# Patient Record
Sex: Female | Born: 1965 | Hispanic: Yes | Marital: Single
Health system: Midwestern US, Community
[De-identification: ages and names within clinical notes are randomized; demographics above are authoritative.]

## PROBLEM LIST (undated history)

## (undated) DIAGNOSIS — I10 Essential (primary) hypertension: Secondary | ICD-10-CM

## (undated) DIAGNOSIS — E039 Hypothyroidism, unspecified: Secondary | ICD-10-CM

## (undated) DIAGNOSIS — G4452 New daily persistent headache (NDPH): Secondary | ICD-10-CM

## (undated) DIAGNOSIS — Z01812 Encounter for preprocedural laboratory examination: Secondary | ICD-10-CM

## (undated) DIAGNOSIS — Z889 Allergy status to unspecified drugs, medicaments and biological substances status: Secondary | ICD-10-CM

## (undated) DIAGNOSIS — J309 Allergic rhinitis, unspecified: Secondary | ICD-10-CM

## (undated) DIAGNOSIS — K219 Gastro-esophageal reflux disease without esophagitis: Secondary | ICD-10-CM

## (undated) DIAGNOSIS — Z1231 Encounter for screening mammogram for malignant neoplasm of breast: Secondary | ICD-10-CM

## (undated) DIAGNOSIS — K2980 Duodenitis without bleeding: Secondary | ICD-10-CM

## (undated) DIAGNOSIS — E559 Vitamin D deficiency, unspecified: Principal | ICD-10-CM

## (undated) DIAGNOSIS — J3089 Other allergic rhinitis: Principal | ICD-10-CM

## (undated) DIAGNOSIS — R921 Mammographic calcification found on diagnostic imaging of breast: Secondary | ICD-10-CM

## (undated) HISTORY — PX: CARDIAC CATHETERIZATION: SHX172

## (undated) HISTORY — PX: BREAST BIOPSY: SHX20

## (undated) MED ORDER — LEVOTHYROXINE 112 MCG TAB
112 mcg | ORAL_TABLET | ORAL | Status: DC
Start: ? — End: 2016-09-23

---

## 2000-12-26 ENCOUNTER — Ambulatory Visit (HOSPITAL_COMMUNITY): Admission: RE | Admit: 2000-12-26 | Discharge: 2000-12-26 | Payer: Self-pay | Admitting: Obstetrics and Gynecology

## 2004-03-29 ENCOUNTER — Emergency Department (HOSPITAL_COMMUNITY): Admission: EM | Admit: 2004-03-29 | Discharge: 2004-03-29 | Payer: Self-pay | Admitting: Emergency Medicine

## 2004-04-30 ENCOUNTER — Ambulatory Visit (HOSPITAL_COMMUNITY): Admission: RE | Admit: 2004-04-30 | Discharge: 2004-04-30 | Payer: Self-pay | Admitting: Obstetrics and Gynecology

## 2004-06-17 ENCOUNTER — Inpatient Hospital Stay (HOSPITAL_COMMUNITY): Admission: RE | Admit: 2004-06-17 | Discharge: 2004-06-18 | Payer: Self-pay | Admitting: Obstetrics and Gynecology

## 2006-10-20 ENCOUNTER — Ambulatory Visit (HOSPITAL_COMMUNITY): Admission: RE | Admit: 2006-10-20 | Discharge: 2006-10-20 | Payer: Self-pay | Admitting: Obstetrics and Gynecology

## 2009-11-03 ENCOUNTER — Ambulatory Visit (HOSPITAL_COMMUNITY): Admission: RE | Admit: 2009-11-03 | Discharge: 2009-11-03 | Payer: Self-pay | Admitting: Obstetrics and Gynecology

## 2010-05-28 NOTE — Consult Note (Signed)
NAME:  Diana Small, Diana Small             ACCOUNT NO.:  [id number]   MEDICAL RECORD NO.:  [id number]          PATIENT TYPE:  EMS   LOCATION:  ED                            FACILITY:  [facility]   PHYSICIAN:  Tilda Burrow, M.D. DATE OF BIRTH:  03-16-1965   DATE OF CONSULTATION:  03/29/2004  DATE OF DISCHARGE:                                   CONSULTATION   Seen in ER consultation regarding left lower quadrant pain in this 45-year-  old Hispanic female.   CHIEF COMPLAINT:  One week of left lower quadrant pain in the upper chest  and upper back that comes and goes, still present intermittent.  Describes a  6 or 7 out of 10.  Not made worse by any medications.  It appears she has  had minimal spotting in the pregnancy.  Evaluation here confirms that she is  indeed pregnant with ultrasound showing a twin gestational sac in the  uterine fundus, 5 weeks 6 days by sac size.  There is no fetal pole  identified due to the early gestation.   The plan is to have her follow up in our office, one week.  She will also  because of the discomfort be given Tylenol No. 3 for pain 30 tablets, one to  two q.4h., plus Phenergan 25 mg tablets one q.6h. p.r.n. nausea.  Follow up  in our office in one week.      JVF/MEDQ  D:  03/29/2004  T:  03/29/2004  Job:  [id number]

## 2010-05-28 NOTE — Op Note (Signed)
NAMETANGY, DROZDOWSKI             ACCOUNT NO.:  [id number]   MEDICAL RECORD NO.:  [id number]          PATIENT TYPE:  AMB   LOCATION:  DAY                           FACILITY:  [facility]   PHYSICIAN:  Tilda Burrow, M.D. DATE OF BIRTH:  1965-07-04   DATE OF PROCEDURE:  06/17/2004  DATE OF DISCHARGE:                                 OPERATIVE REPORT   PREOPERATIVE DIAGNOSIS:  Pregnancy, 17 weeks' gestation, fetal demise in  utero.   POSTOPERATIVE DIAGNOSIS:  Pregnancy, 17 weeks' gestation, fetal demise in  utero, uterine size greater than dates.   PROCEDURE:  Exam under anesthesia, uterine sounding.   SURGEON:  Christin Bach, M.D.   ASSISTANT:  None.   ANESTHESIA:  General with LMA.   COMPLICATIONS:  Uterine cavity size deemed to great to proceed with D&C and  conversion to prostaglandin term evacuation of uterus felt prudent and  safer.   INDICATIONS FOR PROCEDURE:  This 45 year old female admitted for suction  D&C.  She has a 14 week size fetus based on ultrasound done in our office  yesterday.  She was 17 weeks by gestation.  She has a small gestational sac  around the baby (oligohydramnios), fetal pole was compressed and as per  discussions in the office yesterday, plans were for suction D&C.  She had a  cervical laminaria placed yesterday to dilate the cervix and is scheduled  for suction D&C.   DETAILS OF PROCEDURE:  The patient was taken to the operating room after  routine preoperative assessments.  In the preoperative area, the laminaria  had been removed, bimanual exam performed confirmed what was felt to be a 12-  14 week sized uterus.  The patient was then taken to the operating room,  general anesthesia introduced, low lithotomy position, and placed in the  cervix and grasped with a single-toothed tenaculum and found generously  dilated by the laminaria as expected.  The uterus sounded in the anteflexed  position to a depth of 20 cm which was disproportionately  long.  Repeated  examination showed at this time that on bimanual exam with the patient able  to relax that the uterus size was greater than appreciated perioperatively  and reached up to approximately 2 cm below the umbilicus.  The Hanks 43 mm  dilator could easily pass through the already dilated cervix but the depth  of 20 cm was considered contraindication to proceeding with suction D&C  process.   It should be noted that the patient's pregnancy began as a twin gestation  with early first trimester loss of fetus A and then subsequent loss of  second fetus confirmed yesterday in our office.  It is suspected that the  patient has a greater than normal amount of placental tissue resulting in  the uterine enlargement.  Due to the concerns over the effectiveness and  risks associated with attempt to [facility], we are going to admit her and use  prostaglandins and see if we can evacuate the uterus that way.   The patient was allowed to awaken and sent to recovery room in good  condition.  She  will go to labor and delivery where prostaglandin  suppositories will be used to attempt to initiate expulsion of the fetal  tissue and large placenta.   ADDENDUM:  Chart record shows blood type as O positive.       JVF/MEDQ  D:  06/17/2004  T:  06/17/2004  Job:  [id number]

## 2010-05-28 NOTE — Op Note (Signed)
[facility]  Patient:    Diana Small, Diana Small Visit Number: [id number] MRN: [id number]          Service Type: DSU Location: DAY Attending Physician:  Tilda Burrow Dictated by:   Christin Bach, M.D. Admit Date:  12/26/2000                             Operative Report  PREOPERATIVE DIAGNOSIS:  Pregnancy, seven weeks gestation, inevitable abortion.  POSTOPERATIVE DIAGNOSIS:  Pregnancy, seven weeks gestation, inevitable abortion.  PROCEDURE:  Suction dilatation and curettage.  SURGEON:  Christin Bach, M.D.  ASSISTANT:  None.  ANESTHESIA:  General with laryngeal mask anesthesia.  COMPLICATIONS:  None.  FINDINGS:  Uterus sounding to 14 cm, due to enlarged uterine size, tissue and blood clots consistent with miscarriage in process.  DESCRIPTION OF PROCEDURE:  The patient was taken to the operating room, prepped and draped in the usual fashion for a vaginal procedure with green towels and an under-buttock _____ .  The vagina was prepped and draped, a large Graves speculum inserted, and the cervix dilated to 31-French with depth of the uterus sounded in the anteflexed position to 14 cm.  We then proceeded to use smooth, sharp circumferential curettage with an 8 mm suction curet to obtain satisfactory tissue fragments.  Smooth, sharp curettage confirmed that there was some residual tissue in the left cornual areas.  The dissection curettage was continued once again using the suction curet under 45 mmHg suction pressure.  The patient had good tissue evacuation and smooth, sharp curettage then confirmed that the uterus was empty.  Blood type was 0 positive. The patient went to recovery in good condition. Dictated by:   Christin Bach, M.D. Attending Physician:  Tilda Burrow DD:  12/26/00 TD:  12/26/00 Job: [id number]

## 2010-05-28 NOTE — Consult Note (Signed)
Diana Small, SLIWINSKI             ACCOUNT NO.:  [id number]   MEDICAL RECORD NO.:  [id number]          PATIENT TYPE:  OIB   LOCATION:  LDR3                          FACILITY:  [facility]   PHYSICIAN:  Tilda Burrow, M.D. DATE OF BIRTH:  [birth year]   DATE OF CONSULTATION:  DATE OF DISCHARGE:  04/30/2004                                   CONSULTATION   CHIEF COMPLAINT:  Pregnancy, three months, bleeding.   HISTORY OF PRESENT ILLNESS:  This 45 year old Hispanic female patient of  ours followed for pregnancy care, having been seen in our office once or  twice, presents with complaints of vaginal bleeding described as very  little.  The patient speaks Spanish, and her husband speaks [place] with  limited [place].  There was no lifting or precipitating event identified.  Speculum exam shows slight amount of vaginal blood similar to the end of a  menstrual cycle.  No active clots.  Cervix appears closed.  Digital exam  shows the cervix to be long, closed, nontender.  Transvaginal ultrasound  shows a small intrauterine gestational sac with 4 cm fetal pole, consistent  with 10-11 gestation.  Fetal heart rate motion is present.  There are no  obvious sites of large amounts of accumulated blood around the placenta.   IMPRESSION:  Pregnancy, 10-[redacted] weeks gestation.  Subchorionic bleeding.   PLAN:  Pelvic rest at home.  Follow up in five days in our office for  reassessment.  No work duties until reassessed in our office on Wednesday.      JVF/MEDQ  D:  04/30/2004  T:  04/30/2004  Job:  [id number]

## 2010-05-28 NOTE — Discharge Summary (Signed)
NAMESHEPHANIE, ROMAS             ACCOUNT NO.:  [id number]   MEDICAL RECORD NO.:  [id number]          PATIENT TYPE:  INP   LOCATION:  LDR2                          FACILITY:  [facility]   PHYSICIAN:  Tilda Burrow, M.D. DATE OF BIRTH:  17-Aug-1965   DATE OF ADMISSION:  06/17/2004  DATE OF DISCHARGE:  06/09/2006LH                                 DISCHARGE SUMMARY   ADMITTING DIAGNOSIS:  Pregnancy, [redacted] weeks gestation, fetal demise in utero.   DISCHARGE DIAGNOSIS:  Pregnancy, [redacted] weeks gestation, fetal demise in utero,  uterine size greater than dates.   PROCEDURE:  1.  On June 17, 2004, exam under anesthesia, uterine sounding (unsuccessful),      suction and evacuation of uterine cavity.  2.  On June 8th and June 9th, prostaglandin evacuation of uterus.  3.  Manual removal of placenta with uterine curettage.   DISCHARGE MEDICATIONS:  1.  Levaquin 500 mg p.o. 5 days.  2.  Tylox, 15 tablets, p.r.n. pain.   HOSPITAL SUMMARY:  This 45 year old female was admitted for suction D&C.  She had had an ultrasound which showed a small fetus consistent with [redacted]  weeks gestation with evidence of fetal death.  She was 17 weeks by  gestational criteria.  There _was scanty amniotic_______ around the fetus  and the patient was not at all inclined to wait until she began to  spontaneously contract and abort on her own and appropriately requested  early intervention.  We felt like, due to the smaller than anticipated fetal  size, we could evacuate the uterus through suction curettage technique.  She  had a cervical laminaria placed in the office to dilate the cervix, placed  on June 16, 2004, and she was taken to the operating room on June 17, 2004.  Unfortunately, when the patient was asleep, bimanual exam showed that the  uterus was significantly larger than it had been able to be appreciated on  outpatient bimanual exam (patient was 5 feet 0 inches, weight was 198  pounds).  When the dilator passed  through the dilated cervix to a depth of  20 cm, reaching to just below the umbilicus, it was considered a  contraindication to proceeding further.  The discontinuation of the  curettage (D&E) was considered appropriate and she was taken to labor and  delivery.   The patient responded quite well to prostaglandin 20-mg suppositories over  the next 12 hours, aided by the previously dilated cervix.  She expelled the  fetus.  Unfortunately, the placenta was retained and an additional 6 hours  of IV oxytocin after delivery of the fetus did not result in any progress  toward the expulsion of the placenta, so she was taken at early afternoon on  June 18, 2004  to the OR where the uterine evacuation could be performed, of  ring  forceps grasping of the specimen and extracting it from the uterine cavity.  She was then observed for the rest of the afternoon and discharged late  afternoon in  June 18, 2004 with antibiotics and analgesics, for follow-up in  one week in our office.  Blood type is Rh positive.       JVF/MEDQ  D:  06/23/2004  T:  06/24/2004  Job:  [id number]

## 2010-05-28 NOTE — Op Note (Signed)
NAMEJASMINE, Diana Small             ACCOUNT NO.:  [id number]   MEDICAL RECORD NO.:  [id number]          PATIENT TYPE:  INP   LOCATION:  LDR2                          FACILITY:  [facility]   PHYSICIAN:  Tilda Burrow, M.D. DATE OF BIRTH:  12/04/1965   DATE OF PROCEDURE:  06/18/2004  DATE OF DISCHARGE:  06/18/2004                                 OPERATIVE REPORT   PREOPERATIVE DIAGNOSIS:  Retained placenta status post second trimester  prostaglandin evacuation of uterus.   POSTOPERATIVE DIAGNOSIS:  Retained placenta status post second trimester  prostaglandin evacuation of uterus.   PROCEDURE:  Manual removal of placenta.   SURGEON:  Tilda Burrow, M.D.   ASSISTANT:  None.   ANESTHESIA:  General with LMA.   FINDINGS:  The uterus with the placenta in the uterine fundus, amenable to  being extracted with ring forceps.   INDICATIONS:  A 45 year old female who was admitted for suction D&C on June 17, 2004, found to be larger than we were comfortable doing the D&C due to  the 20 cm deep uterine sounding, who underwent prostaglandin induction of  uterine evacuation overnight.  She passed the fetus during the early morning  hours, approximately 5 a.m., but after six hours of continued oxytocin  infusion, there was no evidence of placental tissue near the os.  She was  taken to the OR for removal of the retained placenta.   DETAILS OF PROCEDURE:  The patient was taken to the operating room, where a  speculum could be inserted and the cervix identified and grasped.  The  cervix was 3-4 cm dilated.  Ring forceps could be easily introduced into the  uterine cavity, and we could identify the uterine tissue, which could be  grasped and extracted.  Two ring forceps were used side by side to place  traction on the placental clump of tissue, which was able to be extracted  intact.  A banjo curette was used to perform a brief curettage in the entire  uterine cavity, which identified that the  uterine cavity was satisfactorily  emptied.  The blood loss was 100 mL or less.  The specimen was passed off  for histology and the patient allowed to awaken and go to the recovery room  in good condition.  Previously the prenatal record is showing blood type of  O positive.     JVF/MEDQ  D:  06/23/2004  T:  06/24/2004  Job:  [id number]

## 2010-05-28 NOTE — H&P (Signed)
NAME:  Diana Small, Diana Small             ACCOUNT NO.:  [id number]   MEDICAL RECORD NO.:  [id number]          PATIENT TYPE:  AMB   LOCATION:  DAY                           FACILITY:  [facility]   PHYSICIAN:  Tilda Burrow, M.D. DATE OF BIRTH:  December 14, 1965   DATE OF ADMISSION:  06/17/2004  DATE OF DISCHARGE:  LH                                HISTORY & PHYSICAL   ADMITTING DIAGNOSIS:  Pregnancy, [redacted] weeks gestation, with fetal demise in  utero.   HISTORY OF PRESENT ILLNESS:  This 45 year old female is admitted at this  time for suction D&C for a missed AB.  She was seen in our office with  ultrasound confirmation of missed AB on June 16, 2004.  She has a uterus  which shows an intrauterine fetal demise with fetal pole measuring 2.1 cm  crown-rump length.  The patient understands the inevitably of pregnancy  _loss_.  The gestational sac is quite small.  Fetal pole is compressed.  Plans are for a suction D&C in the a.m.   PAST MEDICAL HISTORY:  Benign.   SURGICAL HISTORY:  D&C.   ALLERGIES:  None.   MEDICATIONS:  Vitamins.   PREGNANCY:  This pregnancy has been notable for initial two gestational  sacs, and then resolution, and now fetal demise.   LABORATORY DATA:  Blood type is O positive.  Hemoglobin 12, hematocrit 41.  Hepatitis, HIV, GC, and Chlamydia all negative.  __________ abnormal, but  found to be due to fetal demise.   Twice she has been seen in labor and delivery for bleeding at 3 months.   IMPRESSION:  Fetal demise in utero.   PLAN:  Suction D&C, June 17, 2004.       JVF/MEDQ  D:  06/16/2004  T:  06/16/2004  Job:  [id number]

## 2011-06-13 MED ORDER — LEVOTHYROXINE 112 MCG TAB
112 mcg | ORAL_TABLET | ORAL | Status: DC
Start: 2011-06-13 — End: 2011-09-15

## 2011-10-21 DIAGNOSIS — I200 Unstable angina: Secondary | ICD-10-CM

## 2011-11-22 ENCOUNTER — Other Ambulatory Visit: Payer: Self-pay | Admitting: Obstetrics and Gynecology

## 2011-11-22 DIAGNOSIS — Z139 Encounter for screening, unspecified: Secondary | ICD-10-CM

## 2011-11-29 ENCOUNTER — Ambulatory Visit (HOSPITAL_COMMUNITY)
Admission: RE | Admit: 2011-11-29 | Discharge: 2011-11-29 | Disposition: A | Discharge status: Home / Self Care | Payer: 59 | Source: Ambulatory Visit | Attending: Obstetrics and Gynecology | Admitting: Obstetrics and Gynecology

## 2011-11-29 DIAGNOSIS — Z139 Encounter for screening, unspecified: Secondary | ICD-10-CM

## 2011-11-29 DIAGNOSIS — Z1231 Encounter for screening mammogram for malignant neoplasm of breast: Secondary | ICD-10-CM | POA: Insufficient documentation

## 2013-10-24 ENCOUNTER — Ambulatory Visit (INDEPENDENT_AMBULATORY_CARE_PROVIDER_SITE_OTHER): Payer: BC Managed Care – PPO | Admitting: Otolaryngology

## 2013-10-24 DIAGNOSIS — R040 Epistaxis: Secondary | ICD-10-CM

## 2013-11-21 ENCOUNTER — Other Ambulatory Visit: Payer: Self-pay | Admitting: Obstetrics and Gynecology

## 2013-11-21 DIAGNOSIS — Z1231 Encounter for screening mammogram for malignant neoplasm of breast: Secondary | ICD-10-CM

## 2013-11-27 ENCOUNTER — Ambulatory Visit (HOSPITAL_COMMUNITY)
Admission: RE | Admit: 2013-11-27 | Discharge: 2013-11-27 | Disposition: A | Discharge status: Home / Self Care | Payer: BC Managed Care – PPO | Source: Ambulatory Visit | Attending: Obstetrics and Gynecology | Admitting: Obstetrics and Gynecology

## 2013-11-27 DIAGNOSIS — Z1231 Encounter for screening mammogram for malignant neoplasm of breast: Secondary | ICD-10-CM | POA: Diagnosis present

## 2013-11-29 ENCOUNTER — Other Ambulatory Visit (HOSPITAL_COMMUNITY): Payer: Self-pay | Admitting: Family Medicine

## 2013-11-29 DIAGNOSIS — Z1231 Encounter for screening mammogram for malignant neoplasm of breast: Secondary | ICD-10-CM

## 2015-06-08 ENCOUNTER — Emergency Department (HOSPITAL_COMMUNITY): Payer: BLUE CROSS/BLUE SHIELD

## 2015-06-08 ENCOUNTER — Inpatient Hospital Stay (HOSPITAL_COMMUNITY)
Admission: EM | Admit: 2015-06-08 | Discharge: 2015-06-10 | DRG: 200 | Disposition: A | Discharge status: Home Health | Payer: BLUE CROSS/BLUE SHIELD | Attending: Surgery | Admitting: Surgery

## 2015-06-08 ENCOUNTER — Encounter (HOSPITAL_COMMUNITY): Payer: Self-pay

## 2015-06-08 DIAGNOSIS — Z6833 Body mass index (BMI) 33.0-33.9, adult: Secondary | ICD-10-CM | POA: Diagnosis not present

## 2015-06-08 DIAGNOSIS — J939 Pneumothorax, unspecified: Secondary | ICD-10-CM | POA: Diagnosis not present

## 2015-06-08 DIAGNOSIS — I10 Essential (primary) hypertension: Secondary | ICD-10-CM | POA: Diagnosis not present

## 2015-06-08 DIAGNOSIS — S270XXA Traumatic pneumothorax, initial encounter: Secondary | ICD-10-CM | POA: Diagnosis not present

## 2015-06-08 DIAGNOSIS — S32029A Unspecified fracture of second lumbar vertebra, initial encounter for closed fracture: Secondary | ICD-10-CM | POA: Diagnosis not present

## 2015-06-08 DIAGNOSIS — S2220XA Unspecified fracture of sternum, initial encounter for closed fracture: Secondary | ICD-10-CM | POA: Diagnosis present

## 2015-06-08 DIAGNOSIS — E669 Obesity, unspecified: Secondary | ICD-10-CM | POA: Diagnosis present

## 2015-06-08 DIAGNOSIS — S82832A Other fracture of upper and lower end of left fibula, initial encounter for closed fracture: Secondary | ICD-10-CM | POA: Diagnosis not present

## 2015-06-08 DIAGNOSIS — S32009A Unspecified fracture of unspecified lumbar vertebra, initial encounter for closed fracture: Secondary | ICD-10-CM

## 2015-06-08 DIAGNOSIS — Z7982 Long term (current) use of aspirin: Secondary | ICD-10-CM | POA: Diagnosis not present

## 2015-06-08 DIAGNOSIS — S82402A Unspecified fracture of shaft of left fibula, initial encounter for closed fracture: Secondary | ICD-10-CM | POA: Diagnosis present

## 2015-06-08 HISTORY — DX: Essential (primary) hypertension: I10

## 2015-06-08 LAB — BASIC METABOLIC PANEL
Anion gap: 11 (ref 5–15)
BUN: 12 mg/dL (ref 6–20)
CHLORIDE: 104 mmol/L (ref 101–111)
CO2: 21 mmol/L — AB (ref 22–32)
CREATININE: 0.76 mg/dL (ref 0.44–1.00)
Calcium: 8.6 mg/dL — ABNORMAL LOW (ref 8.9–10.3)
GFR calc Af Amer: >60 mL/min (ref >60)
GFR calc non Af Amer: >60 mL/min (ref >60)
GLUCOSE: 171 mg/dL — AB (ref 65–99)
POTASSIUM: 3.1 mmol/L — AB (ref 3.5–5.1)
Sodium: 136 mmol/L (ref 135–145)

## 2015-06-08 LAB — CBC WITH DIFFERENTIAL/PLATELET
Basophils Absolute: 0 10*3/uL (ref 0.0–0.1)
Basophils Relative: 0 %
EOS ABS: 0.2 10*3/uL (ref 0.0–0.7)
EOS PCT: 2 %
HCT: 39.1 % (ref 36.0–46.0)
HEMOGLOBIN: 12.7 g/dL (ref 12.0–15.0)
LYMPHS ABS: 1.1 10*3/uL (ref 0.7–4.0)
LYMPHS PCT: 12 %
MCH: 26 pg (ref 26.0–34.0)
MCHC: 32.5 g/dL (ref 30.0–36.0)
MCV: 80 fL (ref 78.0–100.0)
MONOS PCT: 4 %
Monocytes Absolute: 0.4 10*3/uL (ref 0.1–1.0)
Neutro Abs: 7.9 10*3/uL — ABNORMAL HIGH (ref 1.7–7.7)
Neutrophils Relative %: 82 %
PLATELETS: 316 10*3/uL (ref 150–400)
RBC: 4.89 MIL/uL (ref 3.87–5.11)
RDW: 14.2 % (ref 11.5–15.5)
WBC: 9.6 10*3/uL (ref 4.0–10.5)

## 2015-06-08 LAB — I-STAT BETA HCG BLOOD, ED (MC, WL, AP ONLY): I-stat hCG, quantitative: <5 m[IU]/mL (ref <5)

## 2015-06-08 LAB — TROPONIN I

## 2015-06-08 MED ORDER — FENTANYL CITRATE (PF) 100 MCG/2ML IJ SOLN
100.0000 ug | Freq: Once | INTRAMUSCULAR | Status: AC
  Administered 2015-06-08: 100 ug via INTRAVENOUS
  Filled 2015-06-08: qty 2

## 2015-06-08 MED ORDER — POTASSIUM CHLORIDE IN NACL 20-0.9 MEQ/L-% IV SOLN
INTRAVENOUS | Status: DC
Start: 2015-06-09 — End: 2015-06-09
  Administered 2015-06-09: 02:00:00 via INTRAVENOUS
  Filled 2015-06-08: qty 1000

## 2015-06-08 MED ORDER — ONDANSETRON HCL 4 MG/2ML IJ SOLN
4.0000 mg | Freq: Four times a day (QID) | INTRAMUSCULAR | Status: DC | PRN
  Administered 2015-06-09: 4 mg via INTRAVENOUS
  Filled 2015-06-08: qty 2

## 2015-06-08 MED ORDER — ONDANSETRON HCL 4 MG/2ML IJ SOLN
4.0000 mg | Freq: Once | INTRAMUSCULAR | Status: AC
  Administered 2015-06-09: 4 mg via INTRAVENOUS
  Filled 2015-06-08 (×2): qty 2

## 2015-06-08 MED ORDER — POTASSIUM CHLORIDE 10 MEQ/100ML IV SOLN
10.0000 meq | INTRAVENOUS | Status: DC
  Administered 2015-06-08 (×3): 10 meq via INTRAVENOUS
  Filled 2015-06-08 (×3): qty 100

## 2015-06-08 MED ORDER — MORPHINE SULFATE (PF) 4 MG/ML IV SOLN
6.0000 mg | Freq: Once | INTRAVENOUS | Status: AC
  Administered 2015-06-08: 6 mg via INTRAVENOUS
  Filled 2015-06-08: qty 2

## 2015-06-08 MED ORDER — MORPHINE SULFATE (PF) 2 MG/ML IV SOLN
2.0000 mg | INTRAVENOUS | Status: DC | PRN
  Administered 2015-06-09 (×2): 2 mg via INTRAVENOUS
  Filled 2015-06-08 (×2): qty 1

## 2015-06-08 MED ORDER — SODIUM CHLORIDE 0.9 % IV BOLUS (SEPSIS)
1000.0000 mL | Freq: Once | INTRAVENOUS | Status: AC
  Administered 2015-06-08: 1000 mL via INTRAVENOUS

## 2015-06-08 MED ORDER — HYDROMORPHONE HCL 1 MG/ML IJ SOLN
1.0000 mg | Freq: Once | INTRAMUSCULAR | Status: AC
  Administered 2015-06-08: 1 mg via INTRAVENOUS
  Filled 2015-06-08: qty 1

## 2015-06-08 MED ORDER — ONDANSETRON HCL 4 MG PO TABS: 4.0000 mg | ORAL_TABLET | Freq: Four times a day (QID) | ORAL | Status: DC | PRN

## 2015-06-08 MED ORDER — IOPAMIDOL (ISOVUE-300) INJECTION 61%
100.0000 mL | Freq: Once | INTRAVENOUS | Status: AC | PRN
  Administered 2015-06-08: 100 mL via INTRAVENOUS

## 2015-06-08 MED ORDER — ENOXAPARIN SODIUM 40 MG/0.4ML ~~LOC~~ SOLN
40.0000 mg | SUBCUTANEOUS | Status: DC
  Administered 2015-06-09: 40 mg via SUBCUTANEOUS
  Filled 2015-06-08: qty 0.4
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
## 2015-06-08 NOTE — ED Notes (Signed)
Pt has strong peripheral pulses, cap refill is <3 seconds, sensation is intact, pt can move all digits. Short leg splint applied. MD Mesner checked splint after application.

## 2015-06-08 NOTE — ED Notes (Signed)
MD at bedside. 

## 2015-06-08 NOTE — ED Notes (Signed)
Pt complaining of left ankle pain. Lateral ankle swollen
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
## 2015-06-09 ENCOUNTER — Encounter (HOSPITAL_COMMUNITY): Payer: Self-pay | Admitting: General Practice

## 2015-06-09 ENCOUNTER — Inpatient Hospital Stay (HOSPITAL_COMMUNITY): Payer: BLUE CROSS/BLUE SHIELD

## 2015-06-09 DIAGNOSIS — S82402A Unspecified fracture of shaft of left fibula, initial encounter for closed fracture: Secondary | ICD-10-CM | POA: Diagnosis present

## 2015-06-09 DIAGNOSIS — S32009A Unspecified fracture of unspecified lumbar vertebra, initial encounter for closed fracture: Secondary | ICD-10-CM | POA: Diagnosis present

## 2015-06-09 LAB — BASIC METABOLIC PANEL
Anion gap: 8 (ref 5–15)
BUN: 7 mg/dL (ref 6–20)
CHLORIDE: 104 mmol/L (ref 101–111)
CO2: 23 mmol/L (ref 22–32)
CREATININE: 0.67 mg/dL (ref 0.44–1.00)
Calcium: 8.4 mg/dL — ABNORMAL LOW (ref 8.9–10.3)
GFR calc Af Amer: >60 mL/min (ref >60)
GFR calc non Af Amer: >60 mL/min (ref >60)
GLUCOSE: 198 mg/dL — AB (ref 65–99)
Potassium: 4.1 mmol/L (ref 3.5–5.1)
SODIUM: 135 mmol/L (ref 135–145)

## 2015-06-09 LAB — CBC
HEMATOCRIT: 37.8 % (ref 36.0–46.0)
HEMOGLOBIN: 12 g/dL (ref 12.0–15.0)
MCH: 25.5 pg — AB (ref 26.0–34.0)
MCHC: 31.7 g/dL (ref 30.0–36.0)
MCV: 80.3 fL (ref 78.0–100.0)
Platelets: 281 10*3/uL (ref 150–400)
RBC: 4.71 MIL/uL (ref 3.87–5.11)
RDW: 14.1 % (ref 11.5–15.5)
WBC: 12.3 10*3/uL — ABNORMAL HIGH (ref 4.0–10.5)

## 2015-06-09 MED ORDER — OXYCODONE HCL 5 MG PO TABS
5.0000 mg | ORAL_TABLET | ORAL | Status: DC | PRN
  Administered 2015-06-09 – 2015-06-10 (×3): 15 mg via ORAL
  Filled 2015-06-09 (×3): qty 3

## 2015-06-09 MED ORDER — DOCUSATE SODIUM 100 MG PO CAPS
100.0000 mg | ORAL_CAPSULE | Freq: Two times a day (BID) | ORAL | Status: DC
  Administered 2015-06-09 – 2015-06-10 (×3): 100 mg via ORAL
  Filled 2015-06-09 (×3): qty 1

## 2015-06-09 MED ORDER — ENOXAPARIN SODIUM 30 MG/0.3ML ~~LOC~~ SOLN
30.0000 mg | Freq: Two times a day (BID) | SUBCUTANEOUS | Status: DC
Start: 2015-06-09 — End: 2015-06-10
  Administered 2015-06-09 – 2015-06-10 (×2): 30 mg via SUBCUTANEOUS
  Filled 2015-06-09 (×2): qty 0.3

## 2015-06-09 MED ORDER — POLYETHYLENE GLYCOL 3350 17 G PO PACK
17.0000 g | PACK | Freq: Every day | ORAL | Status: DC
  Administered 2015-06-09 – 2015-06-10 (×2): 17 g via ORAL
  Filled 2015-06-09 (×2): qty 1

## 2015-06-09 MED ORDER — MORPHINE SULFATE (PF) 2 MG/ML IV SOLN
2.0000 mg | INTRAVENOUS | Status: DC | PRN
  Administered 2015-06-10: 2 mg via INTRAVENOUS
  Filled 2015-06-09: qty 1

## 2015-06-09 MED ORDER — METHOCARBAMOL 500 MG PO TABS
1000.0000 mg | ORAL_TABLET | Freq: Four times a day (QID) | ORAL | Status: DC | PRN
  Administered 2015-06-10: 1000 mg via ORAL
  Filled 2015-06-09: qty 2
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
## 2015-06-10 MED ORDER — POLYETHYLENE GLYCOL 3350 17 G PO PACK: 17.0000 g | PACK | Freq: Two times a day (BID) | ORAL | Status: AC | PRN

## 2015-06-10 MED ORDER — OXYCODONE HCL 5 MG PO TABS: 5.0000 mg | ORAL_TABLET | ORAL | Status: AC | PRN

## 2015-06-10 MED ORDER — DOCUSATE SODIUM 100 MG PO CAPS: 100.0000 mg | ORAL_CAPSULE | Freq: Two times a day (BID) | ORAL | Status: AC | PRN

## 2015-06-10 MED ORDER — METHOCARBAMOL 500 MG PO TABS: 1000.0000 mg | ORAL_TABLET | Freq: Four times a day (QID) | ORAL | Status: AC | PRN

## 2015-06-10 MED ORDER — MORPHINE SULFATE (PF) 2 MG/ML IV SOLN: 2.0000 mg | Freq: Four times a day (QID) | INTRAVENOUS | Status: DC | PRN
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
## 2015-06-10 NOTE — Care Management Note (Signed)
Case Management Note  Patient Details  Name: Jearld AdjutantMaria O Lien MRN: [id number] Date of Birth: Mar 24, 1965  Subjective/Objective:    Pt discharging home today with family.  PT eval today; no OP follow up recommended.  RW recommended for home.                 [facility]; RW to be delivered to pt's room prior to dc home.    Expected Discharge Date:    06/10/2015              Expected Discharge Plan:  Home w Home Health Services  In-House Referral:     Discharge planning Services  CM Consult  Post Acute Care Choice:  Durable Medical Equipment Choice offered to:  Patient  DME Arranged:  Walker rolling DME Agency:  [facility]  [facility]     Status of Service:  Completed, signed off  Medicare Important Message Given:    Date Medicare IM Given:    Medicare IM give by:    Date Additional Medicare IM Given:    Additional Medicare Important Message give by:     If discussed at Long Length of Stay Meetings, dates discussed:    Additional Comments:  Quintella BatonJulie W. Dwanda Tufano, RN, BSN  Trauma/Neuro ICU Case Manager [phone]
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
## 2015-08-05 ENCOUNTER — Other Ambulatory Visit: Payer: Self-pay | Admitting: Physician Assistant

## 2015-08-05 DIAGNOSIS — M542 Cervicalgia: Secondary | ICD-10-CM

## 2015-08-12 ENCOUNTER — Other Ambulatory Visit: Payer: BLUE CROSS/BLUE SHIELD

## 2015-08-13 ENCOUNTER — Ambulatory Visit
Admission: RE | Admit: 2015-08-13 | Discharge: 2015-08-13 | Disposition: A | Discharge status: Home / Self Care | Payer: BLUE CROSS/BLUE SHIELD | Source: Ambulatory Visit | Attending: Physician Assistant | Admitting: Physician Assistant

## 2015-08-13 DIAGNOSIS — M542 Cervicalgia: Secondary | ICD-10-CM

## 2015-08-21 ENCOUNTER — Other Ambulatory Visit: Payer: Self-pay | Admitting: Physician Assistant

## 2015-08-21 DIAGNOSIS — M5412 Radiculopathy, cervical region: Secondary | ICD-10-CM

## 2015-08-31 ENCOUNTER — Ambulatory Visit
Admission: RE | Admit: 2015-08-31 | Discharge: 2015-08-31 | Disposition: A | Discharge status: Home / Self Care | Payer: BLUE CROSS/BLUE SHIELD | Source: Ambulatory Visit | Attending: Physician Assistant | Admitting: Physician Assistant

## 2015-08-31 DIAGNOSIS — M5412 Radiculopathy, cervical region: Secondary | ICD-10-CM

## 2015-08-31 MED ORDER — IOPAMIDOL (ISOVUE-M 300) INJECTION 61%
1.0000 mL | Freq: Once | INTRAMUSCULAR | Status: AC | PRN
  Administered 2015-08-31: 1 mL via EPIDURAL

## 2015-08-31 MED ORDER — TRIAMCINOLONE ACETONIDE 40 MG/ML IJ SUSP (RADIOLOGY)
60.0000 mg | Freq: Once | INTRAMUSCULAR | Status: AC
  Administered 2015-08-31: 60 mg via EPIDURAL

## 2015-08-31 NOTE — Discharge Instructions (Signed)

## 2016-09-23 ENCOUNTER — Ambulatory Visit
Admit: 2016-09-23 | Payer: PRIVATE HEALTH INSURANCE | Attending: Family Medicine | Primary: Student in an Organized Health Care Education/Training Program

## 2016-09-23 DIAGNOSIS — Z0000 Encounter for general adult medical examination without abnormal findings: Secondary | ICD-10-CM

## 2016-09-23 MED ORDER — HYDROCHLOROTHIAZIDE 12.5 MG TAB
12.5 mg | ORAL_TABLET | Freq: Every day | ORAL | 1 refills | Status: DC
Start: 2016-09-23 — End: 2017-03-20

## 2016-09-23 MED ORDER — LEVOTHYROXINE 112 MCG TAB
112 mcg | ORAL_TABLET | Freq: Every day | ORAL | 1 refills | Status: DC
Start: 2016-09-23 — End: 2016-09-29

## 2016-09-23 NOTE — Patient Instructions (Addendum)
Aprenda sobre la colonoscopia - [ Learning About Colonoscopy ]   Qu?? es una colonoscopia?    Una colonoscopia es una prueba (tambi??n llamada procedimiento) que le permite a un m??dico ver dentro del intestino grueso. El m??dico usa un tubo delgado con luz, llamado colonoscopio. El m??dico lo usa para buscar peque??os crecimientos llamados p??lipos, c??ncer de colon o recto (c??ncer colorrectal), u otros problemas como sangrado.  Durante el procedimiento, el m??dico puede tomar muestras de tejido. Luego, las muestras pueden ser analizadas para ver si tienen c??ncer u otras afecciones. El m??dico tambi??n puede extraer los p??lipos.   C??mo se hace una colonoscopia?  Este procedimiento se hace en el consultorio del m??dico o en una cl??nica u hospital. Le dar??n medicamentos para ayudarle a relajarse y no sentir dolor. Algunas personas no pueden recordar la prueba debido a los medicamentos.  El m??dico mueve suavemente el colonoscopio (o endoscopio) a trav??s del colon. El endoscopio tambi??n es una peque??a c??mara de video. Le permite al m??dico ver el colon y obtener im??genes.  Una colonoscopia generalmente dura de 30 a 45 minutos. Puede durar m??s tiempo si el m??dico tiene que extraer p??lipos.   C??mo se prepara para el procedimiento?  Necesita limpiar el colon antes del procedimiento para que el m??dico pueda verlo completamente. Puede empezar con el proceso de limpieza uno o dos d??as antes de la prueba. Esto depende del tipo de "preparaci??n del colon" que le recomiende su m??dico.  Para limpiar el colon, deje de comer alimentos s??lidos y solo beba l??quidos claros. Puede tomar agua, t??, caf??, jugos claros, caldos, paletas de agua saborizadas y gelatina (como Jell-O). No beba l??quidos rojos ni morados, como jugo de uva o refresco de frutas ("fruit punch"). Tampoco coma alimentos rojos ni morados, como paletas de agua de uva o gelatina de cereza.  El d??a o la noche antes del procedimiento, bebe una gran cantidad de un  l??quido especial. Esto causa evacuaci??n de heces flojas con frecuencia. Ir?? muchas veces al ba??o. Es muy importante beber todo el l??quido de la preparaci??n del colon. Si tiene problemas para beber el l??quido, llame a su m??dico.  Para muchas personas, la preparaci??n es peor que la prueba. Puede ser molesta y es posible que tenga hambre debido a la dieta l??quida absoluta. Algunas personas no van a trabajar ni hacen sus actividades habituales el d??a de la preparaci??n.  Haga arreglos para que alguien lo lleve a su hogar despu??s de la prueba.   Qu?? puede esperar despu??s de una colonoscopia?  Las enfermeras lo vigilar??n durante 1 o 2 horas hasta que desaparezca el efecto de los medicamentos. Luego puede volver a su hogar. Alguien tendr?? que llevarlo. Su m??dico le indicar?? cu??ndo podr?? comer y hacer sus actividades habituales.  Su m??dico hablar?? con usted acerca de cu??ndo deber?? hacerse la siguiente colonoscopia. Los resultados de la prueba y su riesgo de c??ncer colorrectal le ayudar??n a su m??dico a decidir con qu?? frecuencia deber?? revisarlo.  La atenci??n de seguimiento es una parte clave de su tratamiento y seguridad. Aseg??rese de hacer y acudir a todas las citas, y llame a su m??dico si est?? teniendo problemas. Tambi??n es una buena idea saber los resultados de sus ex??menes y mantener una lista de los medicamentos que toma.   D??nde puede encontrar m??s informaci??n en ingl??s?  Vaya a http://www.healthwise.net/GoodHelpConnections.  Escriba Z368 en la b??squeda para aprender m??s acerca de "Aprenda sobre la colonoscopia - [ Learning About Colonoscopy ]."  Revisado: 12   mayo, 2017  Versi??n del contenido: 11.7  ?? 2006-2018 Healthwise, Incorporated. Las instrucciones de cuidado fueron adaptadas bajo licencia por Good Help Connections (which disclaims liability or warranty for this information). Si usted tiene preguntas sobre una afecci??n m??dica o sobre estas instrucciones, siempre pregunte a  su profesional de salud. Healthwise, Incorporated niega toda garant??a o responsabilidad por su uso de esta informaci??n.       Visita de control para personas de 18 a 50 a??os: Instrucciones de cuidado - [ Well Visit, Ages 56 to 32: Care Instructions ]  Instrucciones de cuidado    Los ex??menes f??sicos pueden ayudarle a mantenerse saludable. Su m??dico ha revisado su estado general de salud y podr??a haberle dado algunos consejos para cuidarse. Tambi??n podr??a haberle recomendado otros ex??menes. En su hogar, usted puede ayudar a prevenir enfermedades si come de manera saludable, hace ejercicio con regularidad y sigue otras recomendaciones.  La atenci??n de seguimiento es una parte clave de su tratamiento y seguridad. Aseg??rese de hacer y acudir a todas las citas, y llame a su m??dico si est?? teniendo problemas. Tambi??n es una buena idea [employer] de sus ex??menes y [employer] de los medicamentos que toma.   C??mo puede cuidarse en el hogar?  ?? Alcance un peso saludable y mant??ngalo. Esto disminuir?? el riesgo de [employer], tales como obesidad, diabetes, enfermedad card??aca y presi??n arterial alta.  ?? [employer] f??sica por lo menos 30 minutos la mayor??a de los d??as de Lawyer. Caminar es una buena opci??n. Quiz?? tambi??n desee hacer otras actividades, como correr, nadar, montar en bicicleta, o jugar al tenis u otros deportes de equipo. Discuta con su m??dico cualquier cambio que quiera introducir en su programa de ejercicios.  ?? No fume ni permita que otros fumen cerca de usted. Si necesita ayuda para dejar de fumar, hable con su m??dico sobre programas y medicamentos para dejar de fumar. Pueden aumentar sus probabilidades de dejar el h??bito para siempre.  ?? Consulte con su m??dico si presenta factores de riesgo de infecciones de transmisi??n sexual (STI, por sus siglas en ingl??s). Tener una sola pareja sexual (que no tenga STI y que no tenga relaciones sexuales con nadie m??s)  es una buena manera de evitar estas infecciones.  ?? Utilice m??todos anticonceptivos si no desea tener hijos en este momento. Consulte con su m??dico acerca de las opciones disponibles m??s adecuadas para usted.  ?? Prot??jase la piel del exceso de sol. Cuando est?? al [employer] las 10 a.m. y las 4 p.m., permanezca a la sombra o c??brase con prendas de vestir y un sombrero de ala ancha. Use gafas de sol que bloqueen los rayos ultravioleta. P??ngase un protector solar de amplio espectro (SPF 30 o superior) en la piel expuesta, incluso cuando est?? nublado.  ?? Acuda al dentista una o dos veces al a??o para hacerse chequeos y limpiezas dentales.  ?? En el autom??vil, use el cintur??n de seguridad.  ?? Beba alcohol en forma moderada, o no beba nada. Esto significa no m??s de 2 bebidas al d??a si es hombre, y no m??s de 1 al d??a si es mujer.  Siga las recomendaciones de su m??dico acerca de cu??ndo hacerse determinados ex??menes. Estas pruebas pueden detectar problemas a tiempo.  Para ambos sexos  ?? Colesterol. H??gase un an??lisis de la grasa (colesterol) en la sangre despu??s de los 20 a??os de edad. Su m??dico le indicar?? con qu?? frecuencia debe hacerse este an??lisis seg??n su edad, sus  antecedentes familiares u otros factores que pueden aumentar el riesgo de enfermedad card??aca.  ?? Presi??n arterial. H??gase tomar la presi??n arterial durante una visita de rutina al m??dico. Su m??dico le dir?? con qu?? frecuencia debe revisarse la presi??n arterial seg??n su edad, sus niveles de presi??n arterial y otros factores.  ?? Visi??n. Hable con su m??dico acerca de la frecuencia con que debe hacerse una prueba de glaucoma.  ?? Diabetes. Preg??ntele a su m??dico si deber??a hacerse pruebas para la diabetes.  ?? C??ncer de colon. H??gase una prueba para el c??ncer de colon a los 50 a??os. Usted podr??a hacerse una de las [address]. Si tiene menos de 50 a??os, podr??a necesitar hacerse una prueba antes si tiene factores de  [place]. Los factores de riesgo incluyen si ya le han extra??do un p??lipo precanceroso del colon o si alguno de sus padres, hermanos o hijos ha tenido c??ncer de colon.  Para las mujeres  ?? El examen de senos y la mamograf??a. Preg??ntele a su m??dico cu??ndo debe hacerse un examen cl??nico de los senos (mamas) y Maxie Better??a. Los expertos m??dicos no est??n de acuerdo en si una mujer de menos de 50 a??os de edad debe o no [employer] ex??menes o con qu?? frecuencia. Su m??dico puede ayudarle a decidir qu?? es lo adecuado para usted.  ?? La prueba de Papanicolaou y el examen p??lvico. Comience a hacerse pruebas de Papanicolaou a los 21 a??os. El Papanicolaou es la mejor manera de Engineer, manufacturing el c??ncer de cuello uterino. Esta prueba suele ser parte del examen p??lvico. Pregunte con qu?? frecuencia debe hacerse esta prueba.  ?? Infecciones de transmisi??n sexual (STI, por sus siglas en ingl??s). Consulte si debe hacerse pruebas de detecci??n de STI. Puede correr riesgo si tiene relaciones sexuales con m??s de una persona, especialmente si sus parejas no utilizan condones.  Para los hombres  ?? Infecciones de transmisi??n sexual (STI). Consulte si debe hacerse pruebas de detecci??n de STI. Puede correr riesgo si tiene relaciones sexuales con m??s de una persona, especialmente si usted no utiliza cond??n.  ?? Examen para el c??ncer testicular. Preg??ntele a su m??dico si deber??a hacerse un examen de test??culos con regularidad.  ?? Examen de pr??stata. Hable con su m??dico para saber si debe hacerse un an??lisis de sangre para el c??ncer de pr??stata (prueba del PSA, por sus siglas en ingl??s). Los expertos difieren en cuanto a si los hombres deben hacerse esta prueba y con qu?? frecuencia. Algunos especialistas lo recomiendan a las [employer] de 45 a??os de origen afroamericano o que tienen un padre o un hermano que tuvo c??ncer de pr??stata antes de los 65 a??os.   Cu??ndo debe pedir ayuda?  Preste especial atenci??n a los [employer] y aseg??rese de  comunicarse con su m??dico si tiene problemas o s??ntomas que le preocupan.   D??nde puede encontrar m??s informaci??n en ingl??s?  Vaya a InsuranceStats.[email or web address].  Escriba P072 en la b??squeda para aprender m??s acerca de "Visita de control para personas de 18 a 50 a??os: Instrucciones de cuidado - [ Well Visit, Ages 89 to 38: Care Instructions ]."  Revisado: 16 mayo, 2017  Versi??n del contenido: 11.7  ?? 2006-2018 Healthwise, Incorporated. Las instrucciones de cuidado fueron adaptadas bajo licencia por Good Help Connections (which disclaims liability or warranty for this information). Si usted tiene preguntas sobre una afecci??n m??dica o sobre estas instrucciones, siempre pregunte a su profesional de salud. Healthwise, Incorporated niega toda garant??a o responsabilidad por su uso de esta  informaci??n.       Aprenda acerca de las pruebas de detecci??n del c??ncer de seno - [ Learning About Breast Cancer Screening ]   Qu?? son las pruebas de detecci??n del c??ncer de seno?    El c??ncer de seno (mama) ocurre cuando crecen c??lulas que no son normales en uno o en ambos senos. Las pruebas de detecci??n pueden ayudar a encontrar el c??ncer de seno temprano. El c??ncer es m??s f??cil de tratar cuando se detecta temprano.  Es com??n tener inquietudes acerca del c??ncer de seno. Por eso es importante que hable con su m??dico acerca de cu??ndo comenzar a Sales executive de detecci??n del c??ncer de seno y con qu?? frecuencia hac??rselas.   C??mo se hacen las pruebas de detecci??n del c??ncer de seno?  Pueden usarse varias pruebas para detectar el c??ncer de seno.  ?? Las mamograf??as [employer] se??ales del c??ncer usando radiograf??as (rayos X). Pueden mostrar tumores que son demasiado peque??os como para que usted o su m??dico los perciban. Durante una mamograf??a, una m??quina comprime los senos para aplanarlos y que sean m??s f??ciles de Wellsite geologist. Se toman al [employer] im??genes de cada seno. Neomia Dear se toma de [place] del costado.   ?? Las mamograf??as tridimensionales tambi??n se denominan tomos??ntesis digital de mama. El seno se coloca sobre una placa plana. Se presiona una placa superior contra el seno para mantenerlo en su posici??n. El brazo de la m??quina de rayos X se mueve entonces en un arco por encima del seno y toma muchas im??genes. Neomia Dear computadora utiliza estas radiograf??as para Water quality scientist.  ?? Los ex??menes cl??nicos de los senos son un examen m??dico. El m??dico le palpa cuidadosamente los senos y debajo de los brazos para Engineer, manufacturing bultos u otros cambios.  Despu??s de las pruebas de detecci??n, el m??dico le dar?? los [place]. Tambi??n se le informar?? sobre si necesita pruebas de seguimiento.   Cu??ndo debe hacerse las pruebas de detecci??n?  Hable con su m??dico acerca de cu??ndo deber??a comenzar a Sales executive de detecci??n del c??ncer de seno. La frecuencia con la que se haga las pruebas y el tipo de pruebas que se haga depender??n de su edad y Writer.  Las pautas generales que siguen son para mujeres que tienen un riesgo promedio de c??ncer de seno. Si usted tiene Nurse, adult de c??ncer de seno, como antecedentes familiares de c??ncer de seno en parientes m??ltiples o a una edad temprana, su m??dico puede recomendarle pruebas de detecci??n diferentes.  ?? De 20 a 39 a??os de edad: Algunos expertos recomiendan que las mujeres se hagan un examen cl??nico de los senos cada 3 a??os, comenzando a los 20 a??os. Preg??ntele a su m??dico con qu?? frecuencia debe hacerse esta prueba. Si usted tiene Acupuncturist de c??ncer de seno, hable con su m??dico acerca de cu??ndo comenzar a hacerse mamograf??as anuales y otras pruebas de detecci??n.  ?? De 40 a??os y mayores: Hable con su m??dico acerca de la frecuencia con la que debe hacerse mamograf??as y ex??menes cl??nicos de los senos.   Cu??l es su riesgo de c??ncer de seno?  Si a??n no conoce su riesgo de c??ncer de seno, puede preguntarle a su  m??dico al respecto. Tambi??n puede averiguarlo por medio de [email or web address].  Si su m??dico dice que usted tiene un riesgo alto o muy alto, preg??ntele [employer] de reducir su riesgo. Estas pueden incluir hacerse pruebas de detecci??n adicionales, tomar medicamentos o someterse a cirug??a. Si tiene  antecedentes familiares significativos de c??ncer de seno, preg??ntele a su m??dico acerca de las pruebas gen??ticas.   Qu?? medidas puede tomar para mantenerse saludable?  Algunas cosas que [address] Roadaumentan su riesgo de c??ncer de seno, como su edad y ser [place], no pueden controlarse. Pero usted puede hacer algunas cosas para mantenerse tan saludable como pueda.  ?? Sepa c??mo se ven y se sienten sus senos normalmente. Si nota cualquier cambio, informe a su m??dico.  ?? Consuma alcohol con precauci??n. Su riesgo aumenta mientras m??s alcohol beba. Para una mejor salud, las mujeres deben tomar no m??s de 1 bebida alcoh??lica al d??a o 7 bebidas por semana.  ?? Si fuma, deje de hacerlo. Al dejar de fumar, usted reduce las probabilidades de llegar a padecer varios tipos de c??ncer.  Tambi??n puede esforzarse por comer bien, hacer actividad y Hoodmantenerse en un peso saludable. Comer alimentos saludables y estar activa todos los d??as, as?? como mantenerse en un peso saludable, puede ayudar a prevenir el c??ncer.   D??nde puede encontrar m??s informaci??n en ingl??s?  Vaya a [email or web address].healthwise.net/GoodHelpConnections.  Escriba H706 en la b??squeda para aprender m??s acerca de "Aprenda acerca de las pruebas de detecci??n del c??ncer de seno - [ Learning About Breast Cancer Screening ]."  Revisado: 12 mayo, 2017  Versi??n del contenido: 11.7  ?? 2006-2018 Healthwise, Incorporated. Las instrucciones de cuidado fueron adaptadas bajo licencia por Good Help Connections (which disclaims liability or warranty for this information). Si usted tiene preguntas sobre una afecci??n m??dica o sobre estas instrucciones, siempre pregunte a  su profesional de salud. Healthwise, Incorporated niega toda garant??a o responsabilidad por su uso de esta informaci??n.

## 2016-09-23 NOTE — Progress Notes (Addendum)
Subjective:   51 y.o. female for Well Woman Check.  She is postmenopausal.  Social History: not sexually active.  Pertinent past medical hstory: hypertension, no history of HLD, DVT, CAD, DM, liver disease, migraines or smoking.  Diagnostic mammogram 3 years ago with probably hematoma in left breat at 11:00. She says she has not felt any lumps or masses in either breast. Denies breast pain, skin changes, and nipple discharge.   Normal PAP without HPV testing in 2017    She has HTN and is taking HCTZ 12.5mg  daily. She was on metoprolol in the past but she thought she was supposed to stop taking it so she has taken it in several months. She occasionally checks her BP at work and it is normal. She tries to eat healthy.     She takes levothyroxine for hypothyroidism. She never misses a dose.     She notes she has been feeling "unsteady" and dizzy intermittently with bilateral leg pain for several years thouh she cannot tell me how many years exactly. She denies vision changes, falls, tremors, paresthesias. She says she has a history of a brain tumor several years ago for which she had brain surgery though she cannot remember the exact diagnosis.         Patient Active Problem List   Diagnosis Code   ??? Essential hypertension I10   ??? Impaired glucose tolerance R73.02   ??? Primary hypothyroidism E03.9   ??? Rash R21   ??? Unsatisfactory vaginal cytology smear R87.625     Patient Active Problem List    Diagnosis Date Noted   ??? Essential hypertension 09/22/2016   ??? Impaired glucose tolerance 09/22/2016   ??? Primary hypothyroidism 09/22/2016   ??? Rash 09/22/2016   ??? Unsatisfactory vaginal cytology smear 09/22/2016     Current Outpatient Prescriptions   Medication Sig Dispense Refill   ??? hydroCHLOROthiazide (HYDRODIURIL) 12.5 mg tablet      ??? levothyroxine (SYNTHROID) 112 mcg tablet TAKE ONE TABLET BY MOUTH EVERY DAY 90 Tab 0   ??? metoprolol succinate (TOPROL XL) 50 mg XL tablet Take  by mouth daily.       No Known Allergies   Past Medical History:   Diagnosis Date   ??? Hyperlipidemia    ??? Hypertension    ??? Hypothyroid      Past Surgical History:   Procedure Laterality Date   ??? HX TONSIL AND ADENOIDECTOMY     ??? PR EGD DELIVER THERMAL ENERGY SPHNCTR/CARDIA GERD       Family History   Problem Relation Age of Onset   ??? Diabetes Mother      Social History   Substance Use Topics   ??? Smoking status: Never Smoker   ??? Smokeless tobacco: Never Used   ??? Alcohol use No        ROS: No dyspnea or chest pain on exertion.  No abdominal pain, change in bowel habits, black or bloody stools.  No urinary tract symptoms. GYN ROS: no breast pain or new or enlarging lumps on self exam, no vaginal bleeding, no discharge or pelvic pain, she complains of bilateral leg pain and feeling "off balance" and dizzy for several years; she cannot quantify the years. She denies abdominal pain, nausea, vomiting constiptaion, diarrhea, headaches, vision changes, falls. She sees the eye doctor yearly.       Objective:     Visit Vitals   ??? BP 127/86 (BP 1 Location: Left arm, BP Patient Position: Sitting)   ???  Pulse 70   ??? Temp 98.5 ??F (36.9 ??C) (Oral)   ??? Resp 16   ??? Ht 5\' 7"  (1.702 m)   ??? Wt 164 lb (74.4 kg)   ??? LMP 08/11/2015 (Approximate)   ??? SpO2 98%   ??? BMI 25.69 kg/m2     The patient appears well, alert, oriented x 3, in no distress.  ENT normal.  Neck supple. No adenopathy or thyromegaly. PERLA. Lungs are clear, good air entry, no wheezes, rhonchi or rales. S1 and S2 normal, no murmurs, regular rate and rhythm. Abdomen soft without tenderness, guarding, mass or organomegaly. Extremities show no edema, normal peripheral pulses. Neurological exam is normal, no focal findings.    BREAST EXAM: breasts appear normal, no suspicious masses, no skin or nipple changes or axillary nodes      Assessment/Plan:   51 year old woman with HTN, hypothyroidism, and feeling of unsteadiness/dizziness with history brain tumor. HTN is controlled on HCTZ alone.   postmenopausal  mammogram   counseled on mammography screening  return in 6 months or prn  screening colonoscopy referral written  CBC, CMP, TSH, lipid panel  Will refill levothyroxine and HCTZ  With history of brain tumor and feelings of unsteadiness/dizziness, will refer to neurology. Symptoms have been ongoing for years; warned her if her symptoms worsen, or she gets headaches or vision changes or mental status changes then she needs to go to the ER.     ICD-10-CM ICD-9-CM    1. Essential hypertension I10 401.9 METABOLIC PANEL, COMPREHENSIVE      LIPID PANEL   2. Primary hypothyroidism E03.9 244.9 METABOLIC PANEL, COMPREHENSIVE      LIPID PANEL      TSH 3RD GENERATION   3. Well woman exam (no gynecological exam) Z00.00 V70.0 CBC W/O DIFF      METABOLIC PANEL, COMPREHENSIVE      LIPID PANEL      REFERRAL TO GASTROENTEROLOGY      MAM MAMMO BI SCREENING INCL CAD    [V70.0]   4. History of brain tumor Z87.898 V12.49 REFERRAL TO NEUROLOGY     lab results and schedule of future lab studies reviewed with patient  reviewed diet, exercise and weight control.    Diagnoses, tests, plan, and follow up discussed with patient.  I have given to and reviewed with the patient the after visit summary. I have reviewed medication side effects and precautions. Patient expressed agreement and understanding. All questions were answered.     Discussed with Dr. Lorenso CourierPowers who agrees.     Addendum: Sent refills for HCTZ and levothyroxine.

## 2016-09-23 NOTE — Addendum Note (Signed)
Addended by: Micah NoelMONTE, Carrol Bondar on: 09/23/2016 03:57 PM      Modules accepted: Orders

## 2016-09-23 NOTE — Progress Notes (Signed)
I reviewed with the resident the patient's medical history and the resident's history and findings on the physical examination. I discussed assessment and plan with the resident and concur with the findings and plan as documented in the resident's note.

## 2016-09-23 NOTE — Progress Notes (Signed)
1. Have you been to the ER, urgent care clinic since your last visit?  Hospitalized since your last visit? [facility] for rash on leg - now better    2. Have you seen or consulted any other health care providers outside of the [facility] since your last visit?  Include any pap smears or colon screening. No

## 2016-09-27 NOTE — Telephone Encounter (Signed)
LVM with patient to call office back. [facility] sent wrong tablets to some patients. Not sure if it patient or not. Patient needs to be advised to go local pharmacy or to office to have their hydrocholorothiazide (HCTZ) checked to make sure it is correct medication.

## 2016-09-29 ENCOUNTER — Telehealth

## 2016-09-29 LAB — METABOLIC PANEL, COMPREHENSIVE
A-G Ratio: 1.8 (ref 1.2–2.2)
ALT (SGPT): 18 IU/L (ref 0–32)
AST (SGOT): 26 IU/L (ref 0–40)
Albumin: 4.6 g/dL (ref 3.5–5.5)
Alk. phosphatase: 99 IU/L (ref 39–117)
BUN/Creatinine ratio: 19 (ref 9–23)
BUN: 13 mg/dL (ref 6–24)
Bilirubin, total: 1 mg/dL (ref 0.0–1.2)
CO2: 27 mmol/L (ref 20–29)
Calcium: 9.5 mg/dL (ref 8.7–10.2)
Chloride: 101 mmol/L (ref 96–106)
Creatinine: 0.67 mg/dL (ref 0.57–1.00)
GFR est AA: 119 mL/min/{1.73_m2} (ref >59)
GFR est non-AA: 103 mL/min/{1.73_m2} (ref >59)
GLOBULIN, TOTAL: 2.5 g/dL (ref 1.5–4.5)
Glucose: 80 mg/dL (ref 65–99)
Potassium: 4.2 mmol/L (ref 3.5–5.2)
Protein, total: 7.1 g/dL (ref 6.0–8.5)
Sodium: 142 mmol/L (ref 134–144)

## 2016-09-29 LAB — LIPID PANEL
Cholesterol, total: 192 mg/dL (ref 100–199)
HDL Cholesterol: 51 mg/dL (ref >39)
LDL, calculated: 113 mg/dL — ABNORMAL HIGH (ref 0–99)
Triglyceride: 138 mg/dL (ref 0–149)
VLDL, calculated: 28 mg/dL (ref 5–40)

## 2016-09-29 LAB — CBC W/O DIFF
HCT: 38.4 % (ref 34.0–46.6)
HGB: 12.2 g/dL (ref 11.1–15.9)
MCH: 25 pg — ABNORMAL LOW (ref 26.6–33.0)
MCHC: 31.8 g/dL (ref 31.5–35.7)
MCV: 79 fL (ref 79–97)
PLATELET: 226 10*3/uL (ref 150–379)
RBC: 4.88 x10E6/uL (ref 3.77–5.28)
RDW: 13.9 % (ref 12.3–15.4)
WBC: 3.9 10*3/uL (ref 3.4–10.8)

## 2016-09-29 LAB — TSH 3RD GENERATION: TSH: 0.251 u[IU]/mL — ABNORMAL LOW (ref 0.450–4.500)

## 2016-09-29 MED ORDER — LEVOTHYROXINE 100 MCG TAB
100 mcg | ORAL_TABLET | Freq: Every day | ORAL | 0 refills | Status: DC
Start: 2016-09-29 — End: 2017-01-01

## 2016-09-29 NOTE — Telephone Encounter (Signed)
TSH is low. Decrease levothyroxine. Called patient and told her to stop taking current levothyroxine ( ) and to pick up new levothyroxine ( ) dose from pharmacy and start taking that. Told her to follow up in 6 weeks to check TSH. Patient understands and agrees. All questions answered. Patient was appreciative of the call.

## 2016-11-18 ENCOUNTER — Encounter: Attending: Neurology | Primary: Student in an Organized Health Care Education/Training Program

## 2016-12-21 ENCOUNTER — Ambulatory Visit
Admit: 2016-12-21 | Payer: PRIVATE HEALTH INSURANCE | Attending: Neurology | Primary: Student in an Organized Health Care Education/Training Program

## 2016-12-21 DIAGNOSIS — R269 Unspecified abnormalities of gait and mobility: Secondary | ICD-10-CM

## 2016-12-21 NOTE — Progress Notes (Signed)
Unbalance. Constant*[age]yr. brain tumor surgery.2005

## 2016-12-21 NOTE — Progress Notes (Signed)
NEUROLOGY NEW PATIENT OFFICE CONSULTATION      12/21/2016    RE: Jacqueline Mckinney         [birth year]      REFERRED BY:  Caron Presume, MD        CHIEF COMPLAINT:  This is CASHAE Mckinney is a 51 y.o. female left handed works as housekeeping who had concerns including Dizziness.    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 (+) brain radiation for 6 weeks.     Since surgery, patient noted being off balanced described as veering to the R side and L eye double vision. (-) nausea (-) vomiting    Tried physical therapy 1 yr ago with no benefit.    MRI Brain 1 yr ago was said to be okay.      ROS   (-) fever  (-) rash  All other systems reviewed and are negative    Past Medical Hx  Past Medical History:   Diagnosis Date   ??? Hyperlipidemia    ??? Hypertension    ??? Hypothyroid        Social Hx  Social History     Socioeconomic History   ??? Marital status: UNKNOWN     Spouse name: Not on file   ??? Number of children: Not on file   ??? Years of education: Not on file   ??? Highest education level: Not on file   Tobacco Use   ??? Smoking status: Never Smoker   ??? Smokeless tobacco: Never Used   Substance and Sexual Activity   ??? Alcohol use: No   ??? Drug use: No   ??? Sexual activity: No       Family Hx  Family History   Problem Relation Age of Onset   ??? Diabetes Mother        ALLERGIES  No Known Allergies    CURRENT MEDS  Current Outpatient Medications   Medication Sig Dispense Refill   ??? levothyroxine (SYNTHROID) 100 mcg tablet Take 1 Tab by mouth Daily (before breakfast). 90 Tab 0   ??? hydroCHLOROthiazide (HYDRODIURIL) 12.5 mg tablet Take 1 Tab by mouth daily. 90 Tab 1   ??? metoprolol succinate (TOPROL XL) 50 mg XL tablet Take  by mouth daily.             PREVIOUS WORKUP: (reviewed)  IMAGING:    CT Results (recent):  No results found for this or any previous visit.    MRI Results (recent):   No results found for this or any previous visit.    IR Results (recent):  No results found for this or any previous visit.    VAS/US Results (recent):  No results found for this or any previous visit.        LABS (reviewed)  Results for orders placed or performed in visit on 09/23/16   CBC W/O DIFF   Result Value Ref Range    WBC 3.9 3.4 - 10.8 x10E3/uL    RBC 4.88 3.77 - 5.28 x10E6/uL    HGB 12.2 11.1 - 15.9 g/dL    HCT 38.4 34.0 - 46.6 %    MCV 79 79 - 97 fL    MCH 25.0 (L) 26.6 - 33.0 pg    MCHC 31.8 31.5 - 35.7 g/dL    RDW 13.9 12.3 - 15.4 %  PLATELET 226 150 - 379 [id number]/TO   METABOLIC PANEL, COMPREHENSIVE   Result Value Ref Range    Glucose 80 65 - 99 mg/dL    BUN 13 6 - 24 mg/dL    Creatinine 0.67 0.57 - 1.00 mg/dL    GFR est non-AA 103 >59 mL/min/1.73    GFR est AA 119 >59 mL/min/1.73    BUN/Creatinine ratio 19 9 - 23    Sodium 142 134 - 144 mmol/L    Potassium 4.2 3.5 - 5.2 mmol/L    Chloride 101 96 - 106 mmol/L    CO2 27 20 - 29 mmol/L    Calcium 9.5 8.7 - 10.2 mg/dL    Protein, total 7.1 6.0 - 8.5 g/dL    Albumin 4.6 3.5 - 5.5 g/dL    GLOBULIN, TOTAL 2.5 1.5 - 4.5 g/dL    A-G Ratio 1.8 1.2 - 2.2    Bilirubin, total 1.0 0.0 - 1.2 mg/dL    Alk. phosphatase 99 39 - 117 IU/L    AST (SGOT) 26 0 - 40 IU/L    ALT (SGPT) 18 0 - 32 IU/L   LIPID PANEL   Result Value Ref Range    Cholesterol, total 192 100 - 199 mg/dL    Triglyceride 138 0 - 149 mg/dL    HDL Cholesterol 51 >39 mg/dL    VLDL, calculated 28 5 - 40 mg/dL    LDL, calculated 113 (H) 0 - 99 mg/dL   TSH 3RD GENERATION   Result Value Ref Range    TSH 0.251 (L) 0.450 - 4.500 uIU/mL       Physical Exam:     Visit Vitals  BP 130/85 (BP 1 Location: Right arm, BP Patient Position: Sitting)   Pulse 76   Resp 16   Ht 5' 7"  (1.702 m)   Wt 78 kg (172 lb)   SpO2 98%   BMI 26.94 kg/m??     General:  Alert, cooperative, no distress.   Head:  Normocephalic, without obvious abnormality, atraumatic.   Eyes:  Conjunctivae/corneas clear.   Lungs:   Heart:   Non labored breathing  Regular rate and rhythm, no carotid bruits   Abdomen:   Soft, non-distended   Extremities: Extremities normal, atraumatic, no cyanosis or edema.   Pulses: 2+ and symmetric all extremities.   Skin: Skin color, texture, turgor normal. No rashes or lesions.  Neurologic Exam     Gen: Attention normal             Language: naming, repetition, fluency normal             Memory: intact recent and remote memory  Cranial Nerves:  I: smell Not tested   II: visual fields Full to confrontation   II: pupils Equal, round, reactive to light   II: optic disc No papilledema   III,VII: ptosis none   III,IV,VI: extraocular muscles  Full ROM   V: mastication normal   V: facial light touch sensation  normal   VII: facial muscle function   symmetric   VIII: hearing symmetric   IX: soft palate elevation  normal   XI: trapezius strength  5/5   XI: sternocleidomastoid strength 5/5   XI: neck flexion strength  5/5   XII: tongue  midline     HINTS exam;  Bidirectional horizontal nystagmus and nystagmus on up gaze  (-) head impulse  (+) mild skew deviation  Motor: normal bulk and tone, no tremor  Strength: 5/5 all four extremities  Sensory: intact to LT, PP, vibration, and JPS  Reflexes: 2+ throughout; Down going toes  Coordination: Good FTN and HTS  Gait: has some slight difficulty in tandem walking         Impression:     Jacqueline Mckinney is a 51 y.o. female who  has a past medical history of Hyperlipidemia, Hypertension, and Hypothyroid. 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 Since surgery, patient noted being off balanced described as veering to the R side and L eye double vision. (-) nausea (-) vomiting. Consideration includes cerebellar truncal ataxia due to surgery and radiation. MRI Brain 1 yr ago was said to be okay.    RECOMMENDATIONS   1.  I had a long discussion with patient and daugther. Discussed diagnosis, prognosis, pathophysiology and available treatment.  All questions were answered.  2. Patient to get medical records [facility], lab testing, pathology and MRI reports for my revew  3. Discussed physical therapy, but patient already had one 1 yr ago with no benefit  4. Discussed that at this point, patient more likely has reached maximum medical improvement    Follow-up Disposition:  Return if symptoms worsen or fail to improve.      Thank you for the consultation      Shelbie Hutching, MD  Diplomate, American Board of Psychiatry and Neurology  Diplomate, Neuromuscular Medicine  Diplomate, American Board of Electrodiagnostic Medicine        CC: Caron Presume, MD  Fax: [phone]

## 2017-01-01 ENCOUNTER — Encounter

## 2017-01-02 MED ORDER — LEVOTHYROXINE 100 MCG TAB
100 mcg | ORAL_TABLET | ORAL | 0 refills | Status: DC
Start: 2017-01-02 — End: 2017-01-16

## 2017-01-02 NOTE — Telephone Encounter (Signed)
REfilled medicine for one month. Needs lab work before more refills. Please let her know.

## 2017-01-04 NOTE — Telephone Encounter (Signed)
LVM for pt to schedule appt

## 2017-01-13 ENCOUNTER — Ambulatory Visit
Admit: 2017-01-13 | Discharge: 2017-01-13 | Payer: PRIVATE HEALTH INSURANCE | Attending: Sports Medicine | Primary: Student in an Organized Health Care Education/Training Program

## 2017-01-13 ENCOUNTER — Encounter: Attending: Sports Medicine | Primary: Student in an Organized Health Care Education/Training Program

## 2017-01-13 DIAGNOSIS — E039 Hypothyroidism, unspecified: Secondary | ICD-10-CM

## 2017-01-13 MED ORDER — SERTRALINE 50 MG TAB
50 mg | ORAL_TABLET | ORAL | 1 refills | Status: DC
Start: 2017-01-13 — End: 2017-03-20

## 2017-01-13 NOTE — Progress Notes (Signed)
Identified pt with two pt identifiers(name and DOB).    Chief Complaint   Patient presents with   ??? Medication Refill     levothroxine refill   ??? Follow-up     thyroid check         Health Maintenance Due   Topic   ??? DTaP/Tdap/Td series (1 - Tdap)   ??? PAP AKA CERVICAL CYTOLOGY    ??? Shingrix Vaccine Age 51> (1 of 2)   ??? FOBT Q 1 YEAR AGE 32-75    ??? Influenza Age 569 to Adult        Wt Readings from Last 3 Encounters:   12/21/16 172 lb (78 kg)   09/23/16 164 lb (74.4 kg)     Temp Readings from Last 3 Encounters:   09/23/16 98.5 ??F (36.9 ??C) (Oral)     BP Readings from Last 3 Encounters:   12/21/16 130/85   09/23/16 127/86     Pulse Readings from Last 3 Encounters:   12/21/16 76   09/23/16 70         Learning Assessment:  :     No flowsheet data found.    Depression Screening:  :     PHQ over the last two weeks 09/23/2016   Little interest or pleasure in doing things Not at all   Feeling down, depressed, irritable, or hopeless Not at all   Total Score PHQ 2 0       Fall Risk Assessment:  :     No flowsheet data found.    Abuse Screening:  :     No flowsheet data found.    Coordination of Care Questionnaire:  :     1) Have you been to an emergency room, urgent care clinic since your last visit? no   Hospitalized since your last visit? no             2) Have you seen or consulted any other health care providers outside of [facility] since your last visit? yes neurology, eye doctor, reports had colonoscopy  (Include any pap smears or colon screenings in this section.)    3) Do you have an Advance Directive on file? no  Are you interested in receiving information about Advance Directives? no    Patient is accompanied by self I have received verbal consent from [name] to discuss any/all medical information while they are present in the room.    Reviewed record in preparation for visit and have obtained necessary documentation.  Medication reconciliation up to date and corrected with patient at this  time.

## 2017-01-13 NOTE — Patient Instructions (Addendum)
Recovering From Depression: Care Instructions  Your Care Instructions    Taking good care of yourself is important as you recover from depression. In time, your symptoms will fade as your treatment takes hold. Do not give up. Instead, focus your energy on getting better.  Your mood will improve. It just takes some time. Focus on things that can help you feel better, such as being with friends and family, eating well, and getting enough rest. But take things slowly. Do not do too much too soon. You will begin to feel better gradually.  Follow-up care is a key part of your treatment and safety. Be sure to make and go to all appointments, and call your doctor if you are having problems. It's also a good idea to know your test results and keep a list of the medicines you take.  How can you care for yourself at home?  Be realistic  ?? If you have a large task to do, break it up into smaller steps you can handle, and just do what you can.  ?? You may want to put off important decisions until your depression has lifted. If you have plans that will have a major impact on your life, such as marriage, divorce, or a job change, try to wait a bit. Talk it over with friends and loved ones who can help you look at the overall picture first.  ?? Reaching out to people for help is important. Do not isolate yourself. Let your family and friends help you. Find someone you can trust and confide in, and talk to that person.  ?? Be patient, and be kind to yourself. Remember that depression is not your fault and is not something you can overcome with willpower alone. Treatment is necessary for depression, just like for any other illness. Feeling better takes time, and your mood will improve little by little.  Stay active  ?? Stay busy and get outside. Take a walk, or try some other light exercise.  ?? Talk with your doctor about an exercise program. Exercise can help with mild depression.   ?? Go to a movie or concert. Take part in a church activity or other social gathering. Go to a ball game.  ?? Ask a friend to have dinner with you.  Take care of yourself  ?? Eat a balanced diet with plenty of fresh fruits and vegetables, whole grains, and lean protein. If you have lost your appetite, eat small snacks rather than large meals.  ?? Avoid drinking alcohol or using illegal drugs. Do not take medicines that have not been prescribed for you. They may interfere with medicines you may be taking for depression, or they may make your depression worse.  ?? Take your medicines exactly as they are prescribed. You may start to feel better within 1 to 3 weeks of taking antidepressant medicine. But it can take as many as 6 to 8 weeks to see more improvement. If you have questions or concerns about your medicines, or if you do not notice any improvement by 3 weeks, talk to your doctor.  ?? If you have any side effects from your medicine, tell your doctor. Antidepressants can make you feel tired, dizzy, or nervous. Some people have dry mouth, constipation, headaches, sexual problems, or diarrhea. Many of these side effects are mild and will go away on their own after you have been taking the medicine for a few weeks. Some may last longer. Talk to your doctor if side   effects are bothering you too much. You might be able to try a different medicine.  ?? Get enough sleep. If you have problems sleeping:  ? Go to bed at the same time every night, and get up at the same time every morning.  ? Keep your bedroom dark and quiet.  ? Do not exercise after 5:00 p.m.  ? Avoid drinks with caffeine after 5:00 p.m.  ?? Avoid sleeping pills unless they are prescribed by the doctor treating your depression. Sleeping pills may make you groggy during the day, and they may interact with other medicine you are taking.  ?? If you have any other illnesses, such as diabetes, heart disease, or  high blood pressure, make sure to continue with your treatment. Tell your doctor about all of the medicines you take, including those with or without a prescription.  ?? Keep the numbers for these national suicide hotlines: 1-800-273-TALK 820-719-6912(1-859-462-3315) and 1-800-SUICIDE 579-402-8172(1-640-629-9063). If you or someone you know talks about suicide or feeling hopeless, get help right away.  When should you call for help?  Call 911 anytime you think you may need emergency care. For example, call if:  ?? ?? You feel like hurting yourself or someone else.   ?? ?? Someone you know has depression and is about to attempt or is attempting suicide.   ??Call your doctor now or seek immediate medical care if:  ?? ?? You hear voices.   ?? ?? Someone you know has depression and:  ? Starts to give away his or her possessions.  ? Uses illegal drugs or drinks alcohol heavily.  ? Talks or writes about death, including writing suicide notes or talking about guns, knives, or pills.  ? Starts to spend a lot of time alone.  ? Acts very aggressively or suddenly appears calm.   ??Watch closely for changes in your health, and be sure to contact your doctor if:  ?? ?? You do not get better as expected.   Where can you learn more?  Go to InsuranceStats.cahttp://www.healthwise.net/GoodHelpConnections.  Enter 979-399-2152529 in the search box to learn more about "Recovering From Depression: Care Instructions."  Current as of: December 17, 2015  Content Version: 11.8  ?? 2006-2018 [employer]. Care instructions adapted under license by [employer] (which disclaims liability or warranty for this information). If you have questions about a medical condition or this instruction, always ask your healthcare professional. [employer]wise, Inc[employer] disclaims any warranty or liability for your use of this information.         Hypothyroidism: Care Instructions  Your Care Instructions    You have hypothyroidism, which means that your body is not making enough  thyroid hormone. This hormone helps your body use energy. If your thyroid level is low, you may feel tired, be constipated, have an increase in your blood pressure, or have dry skin or memory problems. You may also get cold easily, even when it is warm. Women with low thyroid levels may have heavy menstrual periods.  A blood test to find your thyroid-stimulating hormone (TSH) level is used to check for hypothyroidism. A high TSH level may mean that you have low thyroid. When your body is not making enough thyroid hormone, TSH levels rise in an effort to make the body produce more.  The treatment for hypothyroidism is to take thyroid hormone pills. You should start to feel better in 1 to 2 weeks. But it can take several months to see changes in the TSH level. You  will need regular visits with your doctor to make sure you have the right dose of medicine.  Most people need treatment for the rest of their lives. You will need to see your doctor regularly to have blood tests and to make sure you are doing well.  Follow-up care is a key part of your treatment and safety. Be sure to make and go to all appointments, and call your doctor if you are having problems. It's also a good idea to know your test results and keep a list of the medicines you take.  How can you care for yourself at home?  ?? Take your thyroid hormone medicine exactly as prescribed. Call your doctor if you think you are having a problem with your medicine. Most people do not have side effects if they take the right amount of medicine regularly.  ? Take the medicine 30 minutes before breakfast, and do not take it with calcium, vitamins, or iron.  ? Do not take extra doses of your thyroid medicine. It will not help you get better any faster, and it may cause side effects.  ? If you forget to take a dose, do NOT take a double dose of medicine. Take your usual dose the next day.  ?? Tell your doctor about all prescription, herbal, or over-the-counter  products you take.  ?? Take care of yourself. Eat a healthy diet, get enough sleep, and get regular exercise.  When should you call for help?  Call 911 anytime you think you may need emergency care. For example, call if:  ?? ?? You passed out (lost consciousness).   ?? ?? You have severe trouble breathing.   ?? ?? You have a very slow heartbeat (less than 60 beats a minute).   ?? ?? You have a low body temperature (95??F or below).   ??Call your doctor now or seek immediate medical care if:  ?? ?? You feel tired, sluggish, or weak.   ?? ?? You have trouble remembering things or concentrating.   ?? ?? You do not begin to feel better 2 weeks after starting your medicine.   ??Watch closely for changes in your health, and be sure to contact your doctor if you have any problems.  Where can you learn more?  Go to InsuranceStats.cahttp://www.healthwise.net/GoodHelpConnections.  Enter [phone] in the search box to learn more about "Hypothyroidism: Care Instructions."  Current as of: March 24, 2016  Content Version: 11.8  ?? 2006-2018 [employer]. Care instructions adapted under license by [employer] (which disclaims liability or warranty for this information). If you have questions about a medical condition or this instruction, always ask your healthcare professional. Healthwise, Incorporated disclaims any warranty or liability for your use of this information.

## 2017-01-13 NOTE — Progress Notes (Addendum)
Jacqueline Mckinney is a 52 y.o. female who presents today for hypothryoidism.     Hypothyroidism:  Patient is seen for followup of hypothyroidism.   Patient is taking medication as prescribed. However, she has been taking it with her blood pressure medications.   Currently taking Levothyroxine  Thyroid ROS: denies, tremor, weight changes, heat/cold intolerance, bowel/skin changes, palpitations  Last TSH reviewed:   Lab Results   Component Value Date/Time    TSH 0.251 (L) 09/28/2016 10:17 AM       Depression  Patient reports that she has felt a little down recently. She reports fatigue, overly tired, sleeping more, and not having as much energy. Daughter also expresses concern that her mother may be depressed. The patients son has not spoken to the patient in over a year and this is causing increase stressed and sadness in her life.  Daughter reports frequent mood swings and patient being antisocial with family. Patient denies suicidal ideations.       ROS: (positive in bold)  General: wt loss, fever, chills, fatigue   Skin: rashes or suspicious skin lesions  Cardiac: chest pain, palpitations  Pul: SOB, dyspnea  Psych: See HPI    Past Medical History:  Past Medical History:   Diagnosis Date   ??? H/O seasonal allergies    ??? Hyperlipidemia    ??? Hypertension    ??? Hypothyroid    ??? Impaired glucose tolerance        Past Surgical History:  Past Surgical History:   Procedure Laterality Date   ??? 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 HX TONSIL AND ADENOIDECTOMY     ??? PR EGD DELIVER THERMAL ENERGY SPHNCTR/CARDIA GERD         Family History:  Family History   Problem Relation Age of Onset   ??? Diabetes Mother        Allergies:  No Known Allergies    Social History:  Social History     Tobacco Use   ??? Smoking status: Never Smoker   ??? Smokeless tobacco: Never Used   Substance Use Topics   ??? Alcohol use: No   ??? Drug use: No       Current Meds:   Current Outpatient Medications on File Prior to Visit   Medication Sig Dispense Refill   ??? levothyroxine (SYNTHROID) 100 mcg tablet TAKE 1 TABLET BY MOUTH ONCE DAILY BEFORE  BREAKFAST 30 Tab 0   ??? hydroCHLOROthiazide (HYDRODIURIL) 12.5 mg tablet Take 1 Tab by mouth daily. 90 Tab 1     No current facility-administered medications on file prior to visit.         Visit Vitals  BP 133/87 (BP 1 Location: Right arm, BP Patient Position: Sitting)   Pulse 66   Temp 98.1 ??F (36.7 ??C) (Oral)   Resp 16   Ht 5\' 4"  (1.626 m)   Wt 170 lb (77.1 kg)   SpO2 97%   BMI 29.18 kg/m??       Gen:  Well developed, well nourished female in no acute distress  HEENT: normocephalic/atraumatic; EOMI  Neck:   Supple, no lympadenopathy, no thyromegaly  Card:  RRR, no m/r/g  Chest:  CTAB, no w/r/r  Psych:  tearful    PHQ over the last two weeks 01/13/2017   Little interest or pleasure in doing things More than half the days   Feeling down, depressed, irritable, or hopeless More  than half the days   Total Score PHQ 2 4   Trouble falling or staying asleep, or sleeping too much Nearly every day   Feeling tired or having little energy Nearly every day   Poor appetite, weight loss, or overeating Not at all   Feeling bad about yourself - or that you are a failure or have let yourself or your family down Not at all   Trouble concentrating on things such as school, work, reading, or watching TV Not at all   Moving or speaking so slowly that other people could have noticed; or the opposite being so fidgety that others notice Not at all   Thoughts of being better off dead, or hurting yourself in some way Not at all   PHQ 9 Score 10   How difficult have these problems made it for you to do your work, take care of your home and get along with others Somewhat difficult         Assessment/Plan:      ICD-10-CM ICD-9-CM    1. Acquired hypothyroidism E03.9 244.9 TSH 3RD GENERATION   2. Depression, unspecified depression type F32.9 311 sertraline (ZOLOFT)  50 mg tablet     Hypothyroidism  Last TSH was low in September. Levothyroxine was reduced from 125mcg to 100mcg. She has been taking Levothyroxine at the same time as her other medications. This may be causing an absorption problem.  No changes at this time. Continue current dose of Levothyroxine 100mcg.  Will check TSH and adjust as needed. Discussed importance of taking medication 30 minutes before all other medications.     Depression  Patient appears to have mild depression. PHQ was 10 today. She has never been diagnosed before with depression. She has some family stressors and chronic pain that may be attributing to her symptoms. Also, hypothyroidism may be contributing. Discussed different treatment options including medications, counselor, and self care. Patient would like to try Zoloft. Will start with 25mg  and titrate to 50mg . Encouraged her to see a counselor and to work on self care. NO SI/HI today. Will continue to monitor if patient does not improve on Zoloft will consider Cymbalta given her chronic pain.     I have discussed the diagnosis with the patient and the intended plan as seen in the above orders.  The patient has received an after-visit summary and questions were answered concerning future plans.  I have discussed medication side effects and warnings with the patient as well. The patient agrees and understands above plan.       Follow-up Disposition:  Return in about 4 weeks (around 02/10/2017) for Depression.    Patient discussed with supervising attending.    Kaleen OdeaAaron Sricharan Lacomb, DO

## 2017-01-13 NOTE — Progress Notes (Signed)
I reviewed with the resident the patient's medical history and the resident's history and findings on the physical examination. I discussed assessment and plan with the resident and concur with the findings and plan as documented in the resident's note.

## 2017-01-13 NOTE — Progress Notes (Signed)
TSH is elevated. She has been taking levothyroxine daily. However, she was taking it with all of her other medications. She is not taking it separate from other meds when she first wakes up in the AM. Will not make any changes at this time and repeat TSH in 6-8 weeks.

## 2017-01-14 LAB — TSH 3RD GENERATION: TSH: 4.88 u[IU]/mL — ABNORMAL HIGH (ref 0.450–4.500)

## 2017-01-16 ENCOUNTER — Telehealth

## 2017-01-16 MED ORDER — LEVOTHYROXINE 100 MCG TAB
100 mcg | ORAL_TABLET | Freq: Every day | ORAL | 1 refills | Status: DC
Start: 2017-01-16 — End: 2017-04-05

## 2017-01-16 NOTE — Telephone Encounter (Signed)
Called and discussed recent TSH. TSH was elevated. She has been taking levothyroxine 100mcg daily. However, she was taking it with all of her other medications. She is not taking it separate from other meds when she first wakes up in the AM. Will not make any changes at this time and repeat TSH in 6-8 weeks. I think she is likely having decreased absorption from other medications. Patient agrees and understands above plan.     Kaleen OdeaAaron Valora Norell, DO

## 2017-03-20 ENCOUNTER — Ambulatory Visit
Admit: 2017-03-20 | Discharge: 2017-03-20 | Payer: PRIVATE HEALTH INSURANCE | Attending: Sports Medicine | Primary: Student in an Organized Health Care Education/Training Program

## 2017-03-20 DIAGNOSIS — E039 Hypothyroidism, unspecified: Secondary | ICD-10-CM

## 2017-03-20 MED ORDER — HYDROCHLOROTHIAZIDE 12.5 MG TAB
12.5 mg | ORAL_TABLET | Freq: Every day | ORAL | 1 refills | Status: DC
Start: 2017-03-20 — End: 2017-11-13

## 2017-03-20 MED ORDER — AMOXICILLIN CLAVULANATE 875 MG-125 MG TAB
875-125 mg | ORAL_TABLET | Freq: Two times a day (BID) | ORAL | 0 refills | Status: AC
Start: 2017-03-20 — End: 2017-03-25

## 2017-03-20 NOTE — Progress Notes (Signed)
Labs reviewed. Thyroid levels normal. Continue current dose of synthroid.

## 2017-03-20 NOTE — Progress Notes (Signed)
Jacqueline Mckinney is a 52 y.o. female who presents today for Hypothyroidism    Hypothyroidism:  Patient is seen for followup of hypothyroidism.   Patient is taking medication as prescribed.  She is taking it in the morning before all of her other medications.   Currently taking Levothyroxine 100 mcg  Thyroid ROS: Reports fatigue, weight gain.  Denies tremor, heat/cold intolerance, bowel/skin changes, palpitations  Last TSH reviewed:   Lab Results   Component Value Date/Time    TSH 4.880 (H) 01/13/2017 04:37 PM     URI  Patient reports sinus congestion, sinus pain, runny, congestion for the past month. Denies fever or chills. Denies sick contacts. She works at a nursing home. She has not tried any OTC medication.    HTN  Patient presents today for HTN follow-up. Currently taking HCTZ. Taking medications daily w/o complications. Home BP readings range from 120-130's.  Patient  trying to follow a low salt diet.        ROS: (positive in bold)  General: wt loss, fever, chills, fatigue   Skin: rashes or suspicious skin lesions  Cardiac: chest pain, palpitations  Pul: SOB, dyspnea  GI: abdominal pain, N&V, diarrhea, constipation   Neuro: weakness, parasthesias, headache, lightheaded, dizziness.  Psych: anxiety, depression    Past Medical History:  Past Medical History:   Diagnosis Date   ??? H/O seasonal allergies    ??? Hyperlipidemia    ??? Hypertension    ??? Hypothyroid    ??? Impaired glucose tolerance        Past Surgical History:  Past Surgical History:   Procedure Laterality Date   ??? 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 HX TONSIL AND ADENOIDECTOMY     ??? PR EGD DELIVER THERMAL ENERGY SPHNCTR/CARDIA GERD         Family History:  Family History   Problem Relation Age of Onset   ??? Diabetes Mother        Allergies:  No Known Allergies    Social History:  Social History     Tobacco Use   ??? Smoking status: Never Smoker   ??? Smokeless tobacco: Never Used   Substance Use Topics    ??? Alcohol use: No   ??? Drug use: No       Current Meds:  Current Outpatient Medications on File Prior to Visit   Medication Sig Dispense Refill   ??? levothyroxine (SYNTHROID) 100 mcg tablet Take 1 Tab by mouth Daily (before breakfast). 30 Tab 1     No current facility-administered medications on file prior to visit.         Visit Vitals  BP 126/78 (BP 1 Location: Left arm, BP Patient Position: Sitting)   Pulse 73   Temp 98 ??F (36.7 ??C) (Oral)   Resp 17   Ht 5\' 7"  (1.702 m)   Wt 172 lb (78 kg)   SpO2 99%   BMI 26.94 kg/m??       Gen:  Well developed, well nourished female in no acute distress  HEENT: normocephalic/atraumatic; PERRL; TM intact, translucent, and neutral BL;  oropharynx shows no erythema or exudates. Frontal and maxillary sinus TTP  Skin:  No rashes or suspicious skin lesions noted  Neck:   Supple, no lympadenopathy, no thyromegaly  Card:  RRR, no m/r/g  Chest:  CTAB, no w/r/r  Extr:  2+ pulses BL, no LE edema     Assessment/Plan:  ICD-10-CM ICD-9-CM    1. Acquired hypothyroidism E03.9 244.9 TSH 3RD GENERATION      T3, FREE      T4, FREE   2. Acute non-recurrent frontal sinusitis J01.10 461.1 amoxicillin-clavulanate (AUGMENTIN) 875-125 mg per tablet   3. Essential hypertension I10 401.9 hydroCHLOROthiazide (HYDRODIURIL) 12.5 mg tablet      Hypothyroidism  TSH was elevated at last visit, however she was not taking Synthroid as directed. She has not started taking medication in the AM and before other meds.  No changes at this time. Continue current dose of Levothyroxine 100 mcg, but will adjust as needed pending TSH.  Discussed importance of taking medication 30 minutes before all other medications.     Sinusitis  Frontal and Maxillary sinus tenderness and symptoms for >1 months. VSS Will treat with Augmentin given duration and symptoms. Discussed sinus rinses as well.     Hypertension  Stable. Continue current medications. No changes at this time.  BP goal  discussed with patient. Encouraged well balanced diet with low sodium intake, and setting a goal of 150 minutes of exercise per week.       I have discussed the diagnosis with the patient and the intended plan as seen in the above orders.  The patient has received an after-visit summary and questions were answered concerning future plans.  I have discussed medication side effects and warnings with the patient as well. The patient agrees and understands above plan.       Follow-up Disposition:  Return in about 3 months (around 06/20/2017).    Patient discussed with supervising attending.    Kaleen OdeaAaron Meril Dray, DO

## 2017-03-20 NOTE — Patient Instructions (Addendum)
Sinusitis: Care Instructions  Your Care Instructions    Sinusitis is an infection of the lining of the sinus cavities in your head. Sinusitis often follows a cold. It causes pain and pressure in your head and face.  In most cases, sinusitis gets better on its own in 1 to 2 weeks. But some mild symptoms may last for several weeks. Sometimes antibiotics are needed.  Follow-up care is a key part of your treatment and safety. Be sure to make and go to all appointments, and call your doctor if you are having problems. It's also a good idea to know your test results and keep a list of the medicines you take.  How can you care for yourself at home?  ?? Take an over-the-counter pain medicine, such as acetaminophen (Tylenol), ibuprofen (Advil, Motrin), or naproxen (Aleve). Read and follow all instructions on the label.  ?? If the doctor prescribed antibiotics, take them as directed. Do not stop taking them just because you feel better. You need to take the full course of antibiotics.  ?? Be careful when taking over-the-counter cold or flu medicines and Tylenol at the same time. Many of these medicines have acetaminophen, which is Tylenol. Read the labels to make sure that you are not taking more than the recommended dose. Too much acetaminophen (Tylenol) can be harmful.  ?? Breathe warm, moist air from a steamy shower, a hot bath, or a sink filled with hot water. Avoid cold, dry air. Using a humidifier in your home may help. Follow the directions for cleaning the machine.  ?? Use saline (saltwater) nasal washes to help keep your nasal passages open and wash out mucus and bacteria. You can buy saline nose drops at a grocery store or drugstore. Or you can make your own at home by adding 1 teaspoon of salt and 1 teaspoon of baking soda to 2 cups of distilled water. If you make your own, fill a bulb syringe with the solution, insert the tip into your nostril, and squeeze gently. Blow your nose.   ?? Put a hot, wet towel or a warm gel pack on your face 3 or 4 times a day for 5 to 10 minutes each time.  ?? Try a decongestant nasal spray like oxymetazoline (Afrin). Do not use it for more than 3 days in a row. Using it for more than 3 days can make your congestion worse.  When should you call for help?  Call your doctor now or seek immediate medical care if:  ?? ?? You have new or worse swelling or redness in your face or around your eyes.   ?? ?? You have a new or higher fever.   ??Watch closely for changes in your health, and be sure to contact your doctor if:  ?? ?? You have new or worse facial pain.   ?? ?? The mucus from your nose becomes thicker (like pus) or has new blood in it.   ?? ?? You are not getting better as expected.   Where can you learn more?  Go to http://www.healthwise.net/GoodHelpConnections.  Enter I933 in the search box to learn more about "Sinusitis: Care Instructions."  Current as of: April 05, 2016  Content Version: 11.9  ?? 2006-2018 [employer]. Care instructions adapted under license by [employer] (which disclaims liability or warranty for this information). If you have questions about a medical condition or this instruction, always ask your healthcare professional. Healthwise, Incorporated disclaims any warranty or liability for your   use of this information.         Hypothyroidism: Care Instructions  Your Care Instructions    You have hypothyroidism, which means that your body is not making enough thyroid hormone. This hormone helps your body use energy. If your thyroid level is low, you may feel tired, be constipated, have an increase in your blood pressure, or have dry skin or memory problems. You may also get cold easily, even when it is warm. Women with low thyroid levels may have heavy menstrual periods.  A blood test to find your thyroid-stimulating hormone (TSH) level is used to check for hypothyroidism. A high TSH level may mean that you have low  thyroid. When your body is not making enough thyroid hormone, TSH levels rise in an effort to make the body produce more.  The treatment for hypothyroidism is to take thyroid hormone pills. You should start to feel better in 1 to 2 weeks. But it can take several months to see changes in the TSH level. You will need regular visits with your doctor to make sure you have the right dose of medicine.  Most people need treatment for the rest of their lives. You will need to see your doctor regularly to have blood tests and to make sure you are doing well.  Follow-up care is a key part of your treatment and safety. Be sure to make and go to all appointments, and call your doctor if you are having problems. It's also a good idea to know your test results and keep a list of the medicines you take.  How can you care for yourself at home?  ?? Take your thyroid hormone medicine exactly as prescribed. Call your doctor if you think you are having a problem with your medicine. Most people do not have side effects if they take the right amount of medicine regularly.  ? Take the medicine 30 minutes before breakfast, and do not take it with calcium, vitamins, or iron.  ? Do not take extra doses of your thyroid medicine. It will not help you get better any faster, and it may cause side effects.  ? If you forget to take a dose, do NOT take a double dose of medicine. Take your usual dose the next day.  ?? Tell your doctor about all prescription, herbal, or over-the-counter products you take.  ?? Take care of yourself. Eat a healthy diet, get enough sleep, and get regular exercise.  When should you call for help?  Call 911 anytime you think you may need emergency care. For example, call if:  ?? ?? You passed out (lost consciousness).   ?? ?? You have severe trouble breathing.   ?? ?? You have a very slow heartbeat (less than 60 beats a minute).   ?? ?? You have a low body temperature (95??F or below).    ??Call your doctor now or seek immediate medical care if:  ?? ?? You feel tired, sluggish, or weak.   ?? ?? You have trouble remembering things or concentrating.   ?? ?? You do not begin to feel better 2 weeks after starting your medicine.   ??Watch closely for changes in your health, and be sure to contact your doctor if you have any problems.  Where can you learn more?  Go to InsuranceStats.cahttp://www.healthwise.net/GoodHelpConnections.  Enter [phone] in the search box to learn more about "Hypothyroidism: Care Instructions."  Current as of: March 23, 2016  Content Version: 11.9  ?? 2006-2018 [employer]wise[employer]. Care instructions adapted under license by [employer] (which disclaims liability or warranty for this information). If you have questions about a medical condition or this instruction, always ask your healthcare professional. Healthwise, Incorporated disclaims any warranty or liability for your use of this information.

## 2017-03-20 NOTE — Progress Notes (Signed)
I reviewed the patient's medical history, the resident's findings on physical examination, the patient's diagnoses, and treatment plan as documented in the resident note.  I concur with the treatment plan as documented.

## 2017-03-20 NOTE — Progress Notes (Signed)
Identified pt with two pt identifiers(name and DOB).    Chief Complaint   Patient presents with   ??? Follow-up     depression - patient reports she is not taking the medication for depression, and thyroid level recheck         Health Maintenance Due   Topic   ??? DTaP/Tdap/Td series (1 - Tdap)   ??? PAP AKA CERVICAL CYTOLOGY    ??? Shingrix Vaccine Age 51> (1 of 2)   ??? FOBT Q 1 YEAR AGE 36-75    ??? Influenza Age 42 to Adult        Wt Readings from Last 3 Encounters:   01/13/17 170 lb (77.1 kg)   12/21/16 172 lb (78 kg)   09/23/16 164 lb (74.4 kg)     Temp Readings from Last 3 Encounters:   01/13/17 98.1 ??F (36.7 ??C) (Oral)   09/23/16 98.5 ??F (36.9 ??C) (Oral)     BP Readings from Last 3 Encounters:   01/13/17 133/87   12/21/16 130/85   09/23/16 127/86     Pulse Readings from Last 3 Encounters:   01/13/17 66   12/21/16 76   09/23/16 70         Learning Assessment:  :     Learning Assessment 01/13/2017   PRIMARY LEARNER Patient   PRIMARY LANGUAGE SPANISH   LEARNER PREFERENCE PRIMARY DEMONSTRATION   ANSWERED BY patient   RELATIONSHIP SELF       Depression Screening:  :     3 most recent PHQ Screens 01/13/2017   Little interest or pleasure in doing things More than half the days   Feeling down, depressed, irritable, or hopeless More than half the days   Total Score PHQ 2 4   Trouble falling or staying asleep, or sleeping too much Nearly every day   Feeling tired or having little energy Nearly every day   Poor appetite, weight loss, or overeating Not at all   Feeling bad about yourself - or that you are a failure or have let yourself or your family down Not at all   Trouble concentrating on things such as school, work, reading, or watching TV Not at all   Moving or speaking so slowly that other people could have noticed; or the opposite being so fidgety that others notice Not at all   Thoughts of being better off dead, or hurting yourself in some way Not at all   PHQ 9 Score 10    How difficult have these problems made it for you to do your work, take care of your home and get along with others Somewhat difficult       Fall Risk Assessment:  :     No flowsheet data found.    Abuse Screening:  :     Abuse Screening Questionnaire 01/13/2017   Do you ever feel afraid of your partner? N   Are you in a relationship with someone who physically or mentally threatens you? N   Is it safe for you to go home? Y       Coordination of Care Questionnaire:  :     1) Have you been to an emergency room, urgent care clinic since your last visit? [facility] - told [facility] - has followed up with podiatrist -   Hospitalized since your last visit? no             2) Have you seen or consulted  any other health care providers outside [facility] since your last visit? yes podiatrist - [facility]  (Include any pap smears or colon screenings in this section.)    3) Do you have an Advance Directive on file? no  Are you interested in receiving information about Advance Directives? no    Patient is accompanied by self I have received verbal consent from [name] to discuss any/all medical information while they are present in the room.    Reviewed record in preparation for visit and have obtained necessary documentation.  Medication reconciliation up to date and corrected with patient at this time.

## 2017-03-21 LAB — TSH 3RD GENERATION: TSH: 2.92 u[IU]/mL (ref 0.450–4.500)

## 2017-03-21 LAB — T4, FREE: T4, Free: 1.27 ng/dL (ref 0.82–1.77)

## 2017-03-21 LAB — T3, FREE: Triiodothyronine (T3), free: 2.4 pg/mL (ref 2.0–4.4)

## 2017-04-05 ENCOUNTER — Encounter

## 2017-04-05 MED ORDER — LEVOTHYROXINE 100 MCG TAB
100 mcg | ORAL_TABLET | ORAL | 1 refills | Status: DC
Start: 2017-04-05 — End: 2017-10-07

## 2017-08-11 ENCOUNTER — Ambulatory Visit
Admit: 2017-08-11 | Discharge: 2017-08-11 | Payer: PRIVATE HEALTH INSURANCE | Attending: Family | Primary: Student in an Organized Health Care Education/Training Program

## 2017-08-11 ENCOUNTER — Ambulatory Visit: Attending: Family | Primary: Student in an Organized Health Care Education/Training Program

## 2017-08-11 DIAGNOSIS — R252 Cramp and spasm: Secondary | ICD-10-CM

## 2017-08-11 MED ORDER — FLUTICASONE 50 MCG/ACTUATION NASAL SPRAY, SUSP
50 mcg/actuation | Freq: Every day | NASAL | 3 refills | Status: DC
Start: 2017-08-11 — End: 2018-03-20

## 2017-08-11 MED ORDER — CETIRIZINE 10 MG TAB
10 mg | ORAL_TABLET | Freq: Every day | ORAL | 1 refills | Status: DC
Start: 2017-08-11 — End: 2018-03-20

## 2017-08-11 NOTE — Progress Notes (Signed)
Assessment/Plan:     Diagnoses and all orders for this visit:    1. Leg cramp  -     METABOLIC PANEL, COMPREHENSIVE  - Unchanged, labs today, start magnesium supplement 273m OTC nightly.  If symptoms do not improve will consider RLS.  Follow up as needed.    2. History of seasonal allergies  -     cetirizine (ZYRTEC) 10 mg tablet; Take 1 Tab by mouth daily.  -     fluticasone propionate (FLONASE) 50 mcg/actuation nasal spray; 1 Spray by Both Nostrils route daily.  - Unchanged, start zyrtec and flonase daily as directed.  Consider allergy testing for food allergies.  Follow up as needed.         Follow-up and Dispositions    ?? Return in about 3 months (around 11/11/2017) for Follow Up.         Discussed expected course/resolution/complications of diagnosis in detail with patient.    Medication risks/benefits/costs/interactions/alternatives discussed with patient.    Pt was given after visit summary which includes diagnoses, current medications & vitals.   Pt expressed understanding with the diagnosis and plan        Subjective:      Jacqueline MCDIARMIDis a 52y.o. female who presents for had concerns including Thyroid Problem (33mo/u; pt states throat has been itchy "everyday for years" and feels this is caused by her thyroid).     Here today for throat itching and eyes itching.  States throat is sore and worse at night. States ears hurt and itch a lot. She states she used to take claritan or allegra but has not been taking It for 3 months.  States sometimes has a scratchy throat when eating carrots.  Denies cough, fever, CP, SOB.     Leg cramps  Also having leg cramps at night.  States not every night may happen once a week. Wakes her up at night.  She states she drinks enough water.      Current Outpatient Medications   Medication Sig Dispense Refill   ??? cetirizine (ZYRTEC) 10 mg tablet Take 1 Tab by mouth daily. 90 Tab 1   ??? fluticasone propionate (FLONASE) 50 mcg/actuation nasal spray 1 Spray by  Both Nostrils route daily. 1 Bottle 3   ??? levothyroxine (SYNTHROID) 100 mcg tablet TAKE 1 TABLET BY MOUTH ONCE DAILY BEFORE BREAKFAST 90 Tab 1   ??? hydroCHLOROthiazide (HYDRODIURIL) 12.5 mg tablet Take 1 Tab by mouth daily. 90 Tab 1   ??? clotrimazole-betamethasone (LOTRISONE) topical cream APPLY TO THE AFFECTED AREA TWICE DAILY  1       No Known Allergies  Past Medical History:   Diagnosis Date   ??? H/O seasonal allergies    ??? Hyperlipidemia    ??? Hypertension    ??? Hypothyroid    ??? Impaired glucose tolerance      Past Surgical History:   Procedure Laterality Date   ??? 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 HX TONSIL AND ADENOIDECTOMY     ??? PR EGD DELIVER THERMAL ENERGY SPHNCTR/CARDIA GERD       Family History   Problem Relation Age of Onset   ??? Diabetes Mother      Social History     Socioeconomic History   ??? Marital status: SINGLE     Spouse name: Not on file   ??? Number of children: Not on file   ???  Years of education: Not on file   ??? Highest education level: Not on file   Occupational History   ??? Not on file   Social Needs   ??? Financial resource strain: Not on file   ??? Food insecurity:     Worry: Not on file     Inability: Not on file   ??? Transportation needs:     Medical: Not on file     Non-medical: Not on file   Tobacco Use   ??? Smoking status: Never Smoker   ??? Smokeless tobacco: Never Used   Substance and Sexual Activity   ??? Alcohol use: No   ??? Drug use: No   ??? Sexual activity: Never   Lifestyle   ??? Physical activity:     Days per week: Not on file     Minutes per session: Not on file   ??? Stress: Not on file   Relationships   ??? Social connections:     Talks on phone: Not on file     Gets together: Not on file     Attends religious service: Not on file     Active member of club or organization: Not on file     Attends meetings of clubs or organizations: Not on file     Relationship status: Not on file   ??? Intimate partner violence:     Fear of current or ex partner: Not on file      Emotionally abused: Not on file     Physically abused: Not on file     Forced sexual activity: Not on file   Other Topics Concern   ??? Not on file   Social History Narrative   ??? Not on file       HPI      ROS:   Review of Systems   Constitutional: Negative for chills, fever and malaise/fatigue.   HENT: Negative for congestion and ear discharge.         Itchy throat   Eyes: Negative for blurred vision.   Respiratory: Negative for cough, shortness of breath and wheezing.    Cardiovascular: Negative for chest pain, palpitations and leg swelling.   Skin: Positive for itching and rash.   Neurological: Negative for dizziness and headaches.       Objective:     Visit Vitals  BP 136/83   Pulse 73   Temp 98.3 ??F (36.8 ??C) (Oral)   Resp 16   Ht 5' 7"  (1.702 m)   Wt 177 lb (80.3 kg)   SpO2 (!) 9%   BMI 27.72 kg/m??         Vitals and Nurse Documentation reviewed.     Physical Exam   Constitutional: She is oriented to person, place, and time and well-developed, well-nourished, and in no distress. Vital signs are normal. No distress.   HENT:   Right Ear: Tympanic membrane is not erythematous and not bulging. No middle ear effusion.   Left Ear: Tympanic membrane is not erythematous and not bulging.  No middle ear effusion.   Nose: No mucosal edema. Right sinus exhibits no maxillary sinus tenderness and no frontal sinus tenderness. Left sinus exhibits no maxillary sinus tenderness and no frontal sinus tenderness.   Mouth/Throat: No oropharyngeal exudate or posterior oropharyngeal erythema.   Cardiovascular: S1 normal and S2 normal. Exam reveals no gallop and no friction rub.   No murmur heard.  Pulmonary/Chest: Breath sounds normal. No respiratory distress. She has no wheezes.  Lymphadenopathy:     She has no cervical adenopathy.   Neurological: She is oriented to person, place, and time.       Results for orders placed or performed in visit on 57/84/69   METABOLIC PANEL, COMPREHENSIVE   Result Value Ref Range     Glucose 86 65 - 99 mg/dL    BUN 14 6 - 24 mg/dL    Creatinine 0.61 0.57 - 1.00 mg/dL    GFR est non-AA 105 >59 mL/min/1.73    GFR est AA 121 >59 mL/min/1.73    BUN/Creatinine ratio 23 9 - 23    Sodium 139 134 - 144 mmol/L    Potassium 4.1 3.5 - 5.2 mmol/L    Chloride 99 96 - 106 mmol/L    CO2 26 20 - 29 mmol/L    Calcium 9.7 8.7 - 10.2 mg/dL    Protein, total 7.4 6.0 - 8.5 g/dL    Albumin 4.5 3.5 - 5.5 g/dL    GLOBULIN, TOTAL 2.9 1.5 - 4.5 g/dL    A-G Ratio 1.6 1.2 - 2.2    Bilirubin, total 1.3 (H) 0.0 - 1.2 mg/dL    Alk. phosphatase 88 39 - 117 IU/L    AST (SGOT) 28 0 - 40 IU/L    ALT (SGPT) 26 0 - 32 IU/L

## 2017-08-11 NOTE — Progress Notes (Signed)
Identified pt with two pt identifiers(name and DOB). Reviewed record in preparation for visit and have obtained necessary documentation.  Chief Complaint   Patient presents with   ??? Thyroid Problem     3mo f/u; pt states throat has been itchy "everyday for years" and feels this is caused by her thyroid        Health Maintenance Due   Topic   ??? DTaP/Tdap/Td series (1 - Tdap)   ??? PAP AKA CERVICAL CYTOLOGY    ??? Shingrix Vaccine Age 50> (1 of 2)   ??? FOBT Q 1 YEAR AGE 50-75    ??? Influenza Age 9 to Adult        Coordination of Care Questionnaire:  :   1) Have you been to an emergency room, urgent care, or hospitalized since your last visit?  If yes, where when, and reason for visit? no       2. Have seen or consulted any other health care provider since your last visit?   If yes, where when, and reason for visit?  NO      Patient is accompanied by son I have received verbal consent from Annalucia D Burdi to discuss any/all medical information while they are present in the room.

## 2017-08-11 NOTE — Patient Instructions (Signed)
Muscle Cramps: Care Instructions  Your Care Instructions    A muscle cramp occurs when a muscle tightens up suddenly. A cramp often happens in the legs. A muscle cramp is also called a muscle spasm or a charley horse.  Muscle cramps usually last less than a minute. However, the pain may last for several minutes. Leg cramps that occur at night may wake you up.  Heavy exercise, dehydration, and being overweight can increase your risk of getting cramps. An imbalance of certain chemicals in your blood, called electrolytes, can also lead to muscle cramps. Pregnant women sometimes get muscle cramps during sleep.  Muscle cramps can be treated by stretching and massaging the muscle. If cramps keep coming back, your doctor may prescribe medicine that relaxes your muscles.  Follow-up care is a key part of your treatment and safety. Be sure to make and go to all appointments, and call your doctor if you are having problems. It's also a good idea to know your test results and keep a list of the medicines you take.  How can you care for yourself at home?  ?? Drink plenty of fluids to prevent dehydration. Choose water and other caffeine-free clear liquids until you feel better. If you have kidney, heart, or liver disease and have to limit fluids, talk with your doctor before you increase the amount of fluids you drink.  ?? Stretch your muscles every day, especially before and after exercise and at bedtime. Regular stretching can relax your muscles and may prevent cramps.  ?? Do not suddenly increase the amount of exercise you get. Increase your exercise a little each week.  ?? When you get a cramp, stretch and massage the muscle. You can also take a warm shower or bath to relax the muscle. A heating pad placed on the muscle can also help.  ?? Take a daily multivitamin supplement.  ?? Ask your doctor if you can take an over-the-counter pain medicine, such as acetaminophen (Tylenol), ibuprofen (Advil, Motrin), or naproxen  (Aleve). Be safe with medicines. Read and follow all instructions on the label.  When should you call for help?  Watch closely for changes in your health, and be sure to contact your doctor if:  ?? ?? You get muscle cramps often that do not go away after home treatment.   ?? ?? Your muscle cramps often wake you up at night.   ?? ?? You do not get better as expected.   Where can you learn more?  Go to http://www.healthwise.net/[facility].  Enter I565 in the search box to learn more about "Muscle Cramps: Care Instructions."  Current as of: September 29, 2016  Content Version: 12.1  ?? 2006-2019 [employer]. Care instructions adapted under license by [employer] (which disclaims liability or warranty for this information). If you have questions about a medical condition or this instruction, always ask your healthcare professional. [employer] disclaims any warranty or liability for your use of this information.

## 2017-08-11 NOTE — Progress Notes (Signed)
Identified pt with two pt identifiers(name and DOB). Reviewed record in preparation for visit and have obtained necessary documentation.  Chief Complaint   Patient presents with   . Thyroid Problem     39mo f/u; pt states throat has been itchy "everyday for years" and feels this is caused by her thyroid        Health Maintenance Due   Topic   . DTaP/Tdap/Td series (1 - Tdap)   . PAP AKA CERVICAL CYTOLOGY    . Shingrix Vaccine Age 51> (1 of 2)   . FOBT Q 1 YEAR AGE 61-75    . Influenza Age 80 to Adult        Coordination of Care Questionnaire:  :   1) Have you been to an emergency room, urgent care, or hospitalized since your last visit?  If yes, where when, and reason for visit? no       2. Have seen or consulted any other health care provider since your last visit?   If yes, where when, and reason for visit?  NO      Patient is accompanied by son I have received verbal consent from Jae Dire to discuss any/all medical information while they are present in the room.

## 2017-08-11 NOTE — Progress Notes (Signed)
Assessment/Plan:     Diagnoses and all orders for this visit:    1. Leg cramp  -     METABOLIC PANEL, COMPREHENSIVE  - Unchanged, labs today, start magnesium supplement 285m OTC nightly.  If symptoms do not improve will consider RLS.  Follow up as needed.    2. History of seasonal allergies  -     cetirizine (ZYRTEC) 10 mg tablet; Take 1 Tab by mouth daily.  -     fluticasone propionate (FLONASE) 50 mcg/actuation nasal spray; 1 Spray by Both Nostrils route daily.  - Unchanged, start zyrtec and flonase daily as directed.  Consider allergy testing for food allergies.  Follow up as needed.         Follow-up and Dispositions    ?? Return in about 3 months (around 11/11/2017) for Follow Up.         Discussed expected course/resolution/complications of diagnosis in detail with patient.    Medication risks/benefits/costs/interactions/alternatives discussed with patient.    Pt was given after visit summary which includes diagnoses, current medications & vitals.   Pt expressed understanding with the diagnosis and plan        Subjective:      Jacqueline PECKENPAUGHis a 52y.o. female who presents for had concerns including Thyroid Problem (357mo/u; pt states throat has been itchy "everyday for years" and feels this is caused by her thyroid).     Here today for throat itching and eyes itching.  States throat is sore and worse at night. States ears hurt and itch a lot. She states she used to take claritan or allegra but has not been taking It for 3 months.  States sometimes has a scratchy throat when eating carrots.  Denies cough, fever, CP, SOB.     Leg cramps  Also having leg cramps at night.  States not every night may happen once a week. Wakes her up at night.  She states she drinks enough water.      Current Outpatient Medications   Medication Sig Dispense Refill   ??? cetirizine (ZYRTEC) 10 mg tablet Take 1 Tab by mouth daily. 90 Tab 1   ??? fluticasone propionate (FLONASE) 50 mcg/actuation nasal spray 1 Spray by Both Nostrils  route daily. 1 Bottle 3   ??? levothyroxine (SYNTHROID) 100 mcg tablet TAKE 1 TABLET BY MOUTH ONCE DAILY BEFORE BREAKFAST 90 Tab 1   ??? hydroCHLOROthiazide (HYDRODIURIL) 12.5 mg tablet Take 1 Tab by mouth daily. 90 Tab 1   ??? clotrimazole-betamethasone (LOTRISONE) topical cream APPLY TO THE AFFECTED AREA TWICE DAILY  1       No Known Allergies  Past Medical History:   Diagnosis Date   ??? H/O seasonal allergies    ??? Hyperlipidemia    ??? Hypertension    ??? Hypothyroid    ??? Impaired glucose tolerance      Past Surgical History:   Procedure Laterality Date   ??? 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 HX TONSIL AND ADENOIDECTOMY     ??? PR EGD DELIVER THERMAL ENERGY SPHNCTR/CARDIA GERD       Family History   Problem Relation Age of Onset   ??? Diabetes Mother      Social History     Socioeconomic History   ??? Marital status: SINGLE     Spouse name: Not on file   ??? Number of children: Not on file   ???  Years of education: Not on file   ??? Highest education level: Not on file   Occupational History   ??? Not on file   Social Needs   ??? Financial resource strain: Not on file   ??? Food insecurity:     Worry: Not on file     Inability: Not on file   ??? Transportation needs:     Medical: Not on file     Non-medical: Not on file   Tobacco Use   ??? Smoking status: Never Smoker   ??? Smokeless tobacco: Never Used   Substance and Sexual Activity   ??? Alcohol use: No   ??? Drug use: No   ??? Sexual activity: Never   Lifestyle   ??? Physical activity:     Days per week: Not on file     Minutes per session: Not on file   ??? Stress: Not on file   Relationships   ??? Social connections:     Talks on phone: Not on file     Gets together: Not on file     Attends religious service: Not on file     Active member of club or organization: Not on file     Attends meetings of clubs or organizations: Not on file     Relationship status: Not on file   ??? Intimate partner violence:     Fear of current or ex partner: Not on file     Emotionally  abused: Not on file     Physically abused: Not on file     Forced sexual activity: Not on file   Other Topics Concern   ??? Not on file   Social History Narrative   ??? Not on file       HPI      ROS:   Review of Systems   Constitutional: Negative for chills, fever and malaise/fatigue.   HENT: Negative for congestion and ear discharge.         Itchy throat   Eyes: Negative for blurred vision.   Respiratory: Negative for cough, shortness of breath and wheezing.    Cardiovascular: Negative for chest pain, palpitations and leg swelling.   Skin: Positive for itching and rash.   Neurological: Negative for dizziness and headaches.       Objective:     Visit Vitals  BP 136/83   Pulse 73   Temp 98.3 ??F (36.8 ??C) (Oral)   Resp 16   Ht _0  (1.702 m)   Wt 177 lb (80.3 kg)   SpO2 (!) 9%   BMI 27.72 kg/m??         Vitals and Nurse Documentation reviewed.     Physical Exam   Constitutional: She is oriented to person, place, and time and well-developed, well-nourished, and in no distress. Vital signs are normal. No distress.   HENT:   Right Ear: Tympanic membrane is not erythematous and not bulging. No middle ear effusion.   Left Ear: Tympanic membrane is not erythematous and not bulging.  No middle ear effusion.   Nose: No mucosal edema. Right sinus exhibits no maxillary sinus tenderness and no frontal sinus tenderness. Left sinus exhibits no maxillary sinus tenderness and no frontal sinus tenderness.   Mouth/Throat: No oropharyngeal exudate or posterior oropharyngeal erythema.   Cardiovascular: S1 normal and S2 normal. Exam reveals no gallop and no friction rub.   No murmur heard.  Pulmonary/Chest: Breath sounds normal. No respiratory distress. She has no wheezes.  Lymphadenopathy:     She has no cervical adenopathy.   Neurological: She is oriented to person, place, and time.       Results for orders placed or performed in visit on 62/83/66   METABOLIC PANEL, COMPREHENSIVE   Result Value Ref Range    Glucose 86 65 - 99 mg/dL     BUN 14 6 - 24 mg/dL    Creatinine 0.61 0.57 - 1.00 mg/dL    GFR est non-AA 105 >59 mL/min/1.73    GFR est AA 121 >59 mL/min/1.73    BUN/Creatinine ratio 23 9 - 23    Sodium 139 134 - 144 mmol/L    Potassium 4.1 3.5 - 5.2 mmol/L    Chloride 99 96 - 106 mmol/L    CO2 26 20 - 29 mmol/L    Calcium 9.7 8.7 - 10.2 mg/dL    Protein, total 7.4 6.0 - 8.5 g/dL    Albumin 4.5 3.5 - 5.5 g/dL    GLOBULIN, TOTAL 2.9 1.5 - 4.5 g/dL    A-G Ratio 1.6 1.2 - 2.2    Bilirubin, total 1.3 (H) 0.0 - 1.2 mg/dL    Alk. phosphatase 88 39 - 117 IU/L    AST (SGOT) 28 0 - 40 IU/L    ALT (SGPT) 26 0 - 32 IU/L

## 2017-08-12 LAB — METABOLIC PANEL, COMPREHENSIVE
A-G Ratio: 1.6 (ref 1.2–2.2)
ALT (SGPT): 26 IU/L (ref 0–32)
AST (SGOT): 28 IU/L (ref 0–40)
Albumin: 4.5 g/dL (ref 3.5–5.5)
Alk. phosphatase: 88 IU/L (ref 39–117)
BUN/Creatinine ratio: 23 (ref 9–23)
BUN: 14 mg/dL (ref 6–24)
Bilirubin, total: 1.3 mg/dL — ABNORMAL HIGH (ref 0.0–1.2)
CO2: 26 mmol/L (ref 20–29)
Calcium: 9.7 mg/dL (ref 8.7–10.2)
Chloride: 99 mmol/L (ref 96–106)
Creatinine: 0.61 mg/dL (ref 0.57–1.00)
GFR est AA: 121 mL/min/{1.73_m2} (ref >59)
GFR est non-AA: 105 mL/min/{1.73_m2} (ref >59)
GLOBULIN, TOTAL: 2.9 g/dL (ref 1.5–4.5)
Glucose: 86 mg/dL (ref 65–99)
Potassium: 4.1 mmol/L (ref 3.5–5.2)
Protein, total: 7.4 g/dL (ref 6.0–8.5)
Sodium: 139 mmol/L (ref 134–144)

## 2017-08-12 LAB — COMPREHENSIVE METABOLIC PANEL
ALT: 26 IU/L (ref 0–32)
AST: 28 IU/L (ref 0–40)
Albumin/Globulin Ratio: 1.6 NA (ref 1.2–2.2)
Albumin: 4.5 g/dL (ref 3.5–5.5)
Alkaline Phosphatase: 88 IU/L (ref 39–117)
BUN: 14 mg/dL (ref 6–24)
Bun/Cre Ratio: 23 NA (ref 9–23)
CO2: 26 mmol/L (ref 20–29)
Calcium: 9.7 mg/dL (ref 8.7–10.2)
Chloride: 99 mmol/L (ref 96–106)
Creatinine: 0.61 mg/dL (ref 0.57–1.00)
EGFR IF NonAfrican American: 105 mL/min/{1.73_m2} (ref >59)
GFR African American: 121 mL/min/{1.73_m2} (ref >59)
Globulin, Total: 2.9 g/dL (ref 1.5–4.5)
Glucose: 86 mg/dL (ref 65–99)
Potassium: 4.1 mmol/L (ref 3.5–5.2)
Sodium: 139 mmol/L (ref 134–144)
Total Bilirubin: 1.3 mg/dL — ABNORMAL HIGH (ref 0.0–1.2)
Total Protein: 7.4 g/dL (ref 6.0–8.5)

## 2017-09-14 ENCOUNTER — Ambulatory Visit
Admit: 2017-09-14 | Discharge: 2017-09-14 | Payer: PRIVATE HEALTH INSURANCE | Attending: Family Medicine | Primary: Student in an Organized Health Care Education/Training Program

## 2017-09-14 ENCOUNTER — Ambulatory Visit: Attending: Family Medicine | Primary: Student in an Organized Health Care Education/Training Program

## 2017-09-14 DIAGNOSIS — R42 Dizziness and giddiness: Secondary | ICD-10-CM

## 2017-09-14 LAB — AMB POC GLUCOSE BLOOD, BY GLUCOSE MONITORING DEVICE
Glucose POC: 73 mg/dL
Glucose, POC: 73 mg/dL

## 2017-09-14 MED ORDER — IBUPROFEN 800 MG TAB
800 mg | ORAL_TABLET | Freq: Three times a day (TID) | ORAL | 11 refills | Status: DC | PRN
Start: 2017-09-14 — End: 2018-10-16

## 2017-09-14 NOTE — Progress Notes (Signed)
Identified pt with two pt identifiers(name and DOB). Reviewed record in preparation for visit and have obtained necessary documentation.  Chief Complaint   Patient presents with   ??? Headache     pt c/o intermittent headache for 3 days; coincides with dry mouth and dizziness she has been exp for approx 5 days   ??? Dizziness     pt states "I feel wierd"   ??? Blood sugar problem     pt used her nephew's BG meter to check her sugar last night and it was 167        Health Maintenance Due   Topic   ??? DTaP/Tdap/Td series (1 - Tdap)   ??? PAP AKA CERVICAL CYTOLOGY    ??? Shingrix Vaccine Age 50> (1 of 2)   ??? FOBT Q 1 YEAR AGE 50-75    ??? Influenza Age 9 to Adult        Coordination of Care Questionnaire:  :   1) Have you been to an emergency room, urgent care, or hospitalized since your last visit?  If yes, where when, and reason for visit? no       2. Have seen or consulted any other health care provider since your last visit?   If yes, where when, and reason for visit?  NO      Patient is accompanied by self I have received verbal consent from Brenlynn D Metter to discuss any/all medical information while they are present in the room.

## 2017-09-14 NOTE — Progress Notes (Signed)
Jacqueline Mckinney is a 52 y.o. female who presents today with the following:    HPI  Chief Complaint   Patient presents with   ??? Headache     pt c/o intermittent headache for 3 days; coincides with dry mouth and dizziness she has been exp for approx 5 days   ??? Dizziness     pt states "I feel wierd"   ??? Blood sugar problem     pt used her nephew's BG meter to check her sugar last night and it was 167       1. Dizziness    2. Dry mouth    3. Serum total bilirubin elevated    4. New daily persistent headache    5. Depression, unspecified depression type    Patient is here about multiple problems and feels stressed out due to her daughter getting pregnant at age of 16.  Patient is crying and tearful in the exam room.  She has recently started noticing her current symptoms which include headache, dizziness and dry mouth.  She was getting very anxious to make sure she does not have diabetes.  She had checked her blood sugar last night at home and her blood sugar was 167 at home.  She requests repeat blood sugar checked today in the office.  She is also anxious and asking about high blood pressure.  She refuses to start any medication for anxiety or depression.  She denies any suicidal or homicidal thoughts.  She declined referral to psychotherapy or counseling.  Hypertension: Blood pressure is stable.  Patient takes medications regularly without any side effects.  I reviewed all the previous lab work which was normal except elevated bilirubin.  Patient requests repeat lab work and I will add liver enzymes and hepatitis panel to you today.    Review of Systems   Constitutional: Negative.    HENT: Negative.    Eyes: Negative.    Respiratory: Negative.    Cardiovascular: Negative.    Gastrointestinal: Negative.    Genitourinary: Negative.    Musculoskeletal: Negative.    Skin: Negative.    Neurological: Positive for dizziness and headaches.   Endo/Heme/Allergies: Negative.     Psychiatric/Behavioral: The patient is nervous/anxious.        Physical Exam   Constitutional: She is oriented to person, place, and time and well-developed, well-nourished, and in no distress.   HENT:   Head: Normocephalic and atraumatic.   Right Ear: External ear normal.   Left Ear: External ear normal.   Nose: Nose normal.   Mouth/Throat: No oropharyngeal exudate.   Eyes: Conjunctivae are normal.   Neck: Normal range of motion. Neck supple. No thyromegaly present.   Cardiovascular: Normal rate and regular rhythm.   Pulmonary/Chest: Effort normal and breath sounds normal.   Abdominal: Soft. Bowel sounds are normal. She exhibits no distension. There is no tenderness.   Musculoskeletal: Normal range of motion. She exhibits no edema.   Lymphadenopathy:     She has no cervical adenopathy.   Neurological: She is alert and oriented to person, place, and time.   Skin: Skin is warm and dry.   Psychiatric: Affect normal. She exhibits a depressed mood.   Nursing note and vitals reviewed.    BP 137/83    Pulse 74    Temp 98.2 ??F (36.8 ??C) (Oral)    Resp 16    Ht 5\' 7"  (1.702 m)    Wt 173 lb (78.5 kg)    SpO2 100%  BMI 27.10 kg/m??     No Known Allergies    Current Outpatient Medications   Medication Sig   ??? ibuprofen (MOTRIN) 800 mg tablet Take 1 Tab by mouth every eight (8) hours as needed for Pain.   ??? cetirizine (ZYRTEC) 10 mg tablet Take 1 Tab by mouth daily.   ??? fluticasone propionate (FLONASE) 50 mcg/actuation nasal spray 1 Spray by Both Nostrils route daily.   ??? levothyroxine (SYNTHROID) 100 mcg tablet TAKE 1 TABLET BY MOUTH ONCE DAILY BEFORE BREAKFAST   ??? hydroCHLOROthiazide (HYDRODIURIL) 12.5 mg tablet Take 1 Tab by mouth daily.   ??? clotrimazole-betamethasone (LOTRISONE) topical cream APPLY TO THE AFFECTED AREA TWICE DAILY     No current facility-administered medications for this visit.        Past Medical History:   Diagnosis Date   ??? H/O seasonal allergies    ??? Hyperlipidemia    ??? Hypertension    ??? Hypothyroid     ??? Impaired glucose tolerance        Past Surgical History:   Procedure Laterality Date   ??? 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 HX TONSIL AND ADENOIDECTOMY     ??? PR EGD DELIVER THERMAL ENERGY SPHNCTR/CARDIA GERD         Problem List  Date Reviewed: 10/05/17          Codes Class Noted    Essential hypertension ICD-10-CM: I10  ICD-9-CM: 401.9  09/22/2016        Impaired glucose tolerance ICD-10-CM: R73.02  ICD-9-CM: 790.22  09/22/2016        Primary hypothyroidism ICD-10-CM: E03.9  ICD-9-CM: 244.9  09/22/2016        Rash ICD-10-CM: R21  ICD-9-CM: 782.1  09/22/2016        Unsatisfactory vaginal cytology smear ICD-10-CM: R87.625  ICD-9-CM: 795.18  09/22/2016               Results for orders placed or performed in visit on October 05, 2017   AMB POC GLUCOSE BLOOD, BY GLUCOSE MONITORING DEVICE   Result Value Ref Range    Glucose POC 73 mg/dL         1. Dizziness    - AMB POC GLUCOSE BLOOD, BY GLUCOSE MONITORING DEVICE  - METABOLIC PANEL, COMPREHENSIVE  - URINALYSIS W/ RFLX MICROSCOPIC    2. Dry mouth    - AMB POC GLUCOSE BLOOD, BY GLUCOSE MONITORING DEVICE    3. Serum total bilirubin elevated    - HEPATITIS PANEL, ACUTE    4. New daily persistent headache    - ibuprofen (MOTRIN) 800 mg tablet; Take 1 Tab by mouth every eight (8) hours as needed for Pain.  Dispense: 30 Tab; Refill: 11    5.  Depression  Patient declined psychotherapy, counseling referral or starting any medication at this time.    Follow-up and Dispositions    ?? Return if symptoms worsen or fail to improve.         I have discussed the diagnosis with the patient and the intended plan as seen in the above orders.  The patient has received an after-visit summary and questions were answered concerning future plans.  I have discussed medication side effects and warnings with the patient as well. The patient agrees and understands above plan.           [name], MD

## 2017-09-14 NOTE — Progress Notes (Signed)
 Identified pt with two pt identifiers(name and DOB). Reviewed record in preparation for visit and have obtained necessary documentation.  Chief Complaint   Patient presents with   . Headache     pt c/o intermittent headache for 3 days; coincides with dry mouth and dizziness she has been exp for approx 5 days   . Dizziness     pt states I feel wierd   . Blood sugar problem     pt used her nephew's BG meter to check her sugar last night and it was 167        Health Maintenance Due   Topic   . DTaP/Tdap/Td series (1 - Tdap)   . PAP AKA CERVICAL CYTOLOGY    . Shingrix Vaccine Age 74> (1 of 2)   . FOBT Q 1 YEAR AGE 87-75    . Influenza Age 74 to Adult        Coordination of Care Questionnaire:  :   1) Have you been to an emergency room, urgent care, or hospitalized since your last visit?  If yes, where when, and reason for visit? no       2. Have seen or consulted any other health care provider since your last visit?   If yes, where when, and reason for visit?  NO      Patient is accompanied by self I have received verbal consent from Hadassah JONETTA Stallion to discuss any/all medical information while they are present in the room.

## 2017-09-14 NOTE — Progress Notes (Signed)
Jacqueline Mckinney is a 52 y.o. female who presents today with the following:    HPI  Chief Complaint   Patient presents with   ??? Headache     pt c/o intermittent headache for 3 days; coincides with dry mouth and dizziness she has been exp for approx 5 days   ??? Dizziness     pt states "I feel wierd"   ??? Blood sugar problem     pt used her nephew's BG meter to check her sugar last night and it was 167       1. Dizziness    2. Dry mouth    3. Serum total bilirubin elevated    4. New daily persistent headache    5. Depression, unspecified depression type    Patient is here about multiple problems and feels stressed out due to her daughter getting pregnant at age of 45.  Patient is crying and tearful in the exam room.  She has recently started noticing her current symptoms which include headache, dizziness and dry mouth.  She was getting very anxious to make sure she does not have diabetes.  She had checked her blood sugar last night at home and her blood sugar was 167 at home.  She requests repeat blood sugar checked today in the office.  She is also anxious and asking about high blood pressure.  She refuses to start any medication for anxiety or depression.  She denies any suicidal or homicidal thoughts.  She declined referral to psychotherapy or counseling.  Hypertension: Blood pressure is stable.  Patient takes medications regularly without any side effects.  I reviewed all the previous lab work which was normal except elevated bilirubin.  Patient requests repeat lab work and I will add liver enzymes and hepatitis panel to you today.    Review of Systems   Constitutional: Negative.    HENT: Negative.    Eyes: Negative.    Respiratory: Negative.    Cardiovascular: Negative.    Gastrointestinal: Negative.    Genitourinary: Negative.    Musculoskeletal: Negative.    Skin: Negative.    Neurological: Positive for dizziness and headaches.   Endo/Heme/Allergies: Negative.    Psychiatric/Behavioral: The patient is  nervous/anxious.        Physical Exam   Constitutional: She is oriented to person, place, and time and well-developed, well-nourished, and in no distress.   HENT:   Head: Normocephalic and atraumatic.   Right Ear: External ear normal.   Left Ear: External ear normal.   Nose: Nose normal.   Mouth/Throat: No oropharyngeal exudate.   Eyes: Conjunctivae are normal.   Neck: Normal range of motion. Neck supple. No thyromegaly present.   Cardiovascular: Normal rate and regular rhythm.   Pulmonary/Chest: Effort normal and breath sounds normal.   Abdominal: Soft. Bowel sounds are normal. She exhibits no distension. There is no tenderness.   Musculoskeletal: Normal range of motion. She exhibits no edema.   Lymphadenopathy:     She has no cervical adenopathy.   Neurological: She is alert and oriented to person, place, and time.   Skin: Skin is warm and dry.   Psychiatric: Affect normal. She exhibits a depressed mood.   Nursing note and vitals reviewed.    BP 137/83    Pulse 74    Temp 98.2 ??F (36.8 ??C) (Oral)    Resp 16    Ht 5\' 7"  (1.702 m)    Wt 173 lb (78.5 kg)    SpO2 100%  BMI 27.10 kg/m??     No Known Allergies    Current Outpatient Medications   Medication Sig   ??? ibuprofen (MOTRIN) 800 mg tablet Take 1 Tab by mouth every eight (8) hours as needed for Pain.   ??? cetirizine (ZYRTEC) 10 mg tablet Take 1 Tab by mouth daily.   ??? fluticasone propionate (FLONASE) 50 mcg/actuation nasal spray 1 Spray by Both Nostrils route daily.   ??? levothyroxine (SYNTHROID) 100 mcg tablet TAKE 1 TABLET BY MOUTH ONCE DAILY BEFORE BREAKFAST   ??? hydroCHLOROthiazide (HYDRODIURIL) 12.5 mg tablet Take 1 Tab by mouth daily.   ??? clotrimazole-betamethasone (LOTRISONE) topical cream APPLY TO THE AFFECTED AREA TWICE DAILY     No current facility-administered medications for this visit.        Past Medical History:   Diagnosis Date   ??? H/O seasonal allergies    ??? Hyperlipidemia    ??? Hypertension    ??? Hypothyroid    ??? Impaired glucose tolerance         Past Surgical History:   Procedure Laterality Date   ??? 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 HX TONSIL AND ADENOIDECTOMY     ??? PR EGD DELIVER THERMAL ENERGY SPHNCTR/CARDIA GERD         Problem List  Date Reviewed: 10/13/17          Codes Class Noted    Essential hypertension ICD-10-CM: I10  ICD-9-CM: 401.9  09/22/2016        Impaired glucose tolerance ICD-10-CM: R73.02  ICD-9-CM: 790.22  09/22/2016        Primary hypothyroidism ICD-10-CM: E03.9  ICD-9-CM: 244.9  09/22/2016        Rash ICD-10-CM: R21  ICD-9-CM: 782.1  09/22/2016        Unsatisfactory vaginal cytology smear ICD-10-CM: R87.625  ICD-9-CM: 795.18  09/22/2016               Results for orders placed or performed in visit on 10/13/2017   AMB POC GLUCOSE BLOOD, BY GLUCOSE MONITORING DEVICE   Result Value Ref Range    Glucose POC 73 mg/dL         1. Dizziness    - AMB POC GLUCOSE BLOOD, BY GLUCOSE MONITORING DEVICE  - METABOLIC PANEL, COMPREHENSIVE  - URINALYSIS W/ RFLX MICROSCOPIC    2. Dry mouth    - AMB POC GLUCOSE BLOOD, BY GLUCOSE MONITORING DEVICE    3. Serum total bilirubin elevated    - HEPATITIS PANEL, ACUTE    4. New daily persistent headache    - ibuprofen (MOTRIN) 800 mg tablet; Take 1 Tab by mouth every eight (8) hours as needed for Pain.  Dispense: 30 Tab; Refill: 11    5.  Depression  Patient declined psychotherapy, counseling referral or starting any medication at this time.    Follow-up and Dispositions    ?? Return if symptoms worsen or fail to improve.         I have discussed the diagnosis with the patient and the intended plan as seen in the above orders.  The patient has received an after-visit summary and questions were answered concerning future plans.  I have discussed medication side effects and warnings with the patient as well. The patient agrees and understands above plan.           [name], MD

## 2017-09-15 LAB — METABOLIC PANEL, COMPREHENSIVE
A-G Ratio: 1.8 (ref 1.2–2.2)
ALT (SGPT): 26 IU/L (ref 0–32)
AST (SGOT): 32 IU/L (ref 0–40)
Albumin: 5.1 g/dL (ref 3.5–5.5)
Alk. phosphatase: 95 IU/L (ref 39–117)
BUN/Creatinine ratio: 19 (ref 9–23)
BUN: 13 mg/dL (ref 6–24)
Bilirubin, total: 1.1 mg/dL (ref 0.0–1.2)
CO2: 23 mmol/L (ref 20–29)
Calcium: 10.1 mg/dL (ref 8.7–10.2)
Chloride: 99 mmol/L (ref 96–106)
Creatinine: 0.69 mg/dL (ref 0.57–1.00)
GFR est AA: 117 mL/min/{1.73_m2} (ref >59)
GFR est non-AA: 101 mL/min/{1.73_m2} (ref >59)
GLOBULIN, TOTAL: 2.9 g/dL (ref 1.5–4.5)
Glucose: 81 mg/dL (ref 65–99)
Potassium: 4.7 mmol/L (ref 3.5–5.2)
Protein, total: 8 g/dL (ref 6.0–8.5)
Sodium: 139 mmol/L (ref 134–144)

## 2017-09-15 LAB — URINALYSIS W/ RFLX MICROSCOPIC
Bilirubin, Urine: NEGATIVE
Bilirubin: NEGATIVE
Blood, Urine: NEGATIVE
Blood: NEGATIVE
Glucose, UA: NEGATIVE
Glucose: NEGATIVE
Ketone: NEGATIVE
Ketones, Urine: NEGATIVE
Leukocyte Esterase, Urine: NEGATIVE
Leukocyte Esterase: NEGATIVE
Nitrite, Urine: NEGATIVE
Nitrites: NEGATIVE
Protein, UA: NEGATIVE
Protein: NEGATIVE
Specific Gravity, UA: 1.018 NA (ref 1.005–1.030)
Specific Gravity: 1.018 (ref 1.005–1.030)
Urobilinogen, Urine: 0.2 mg/dL (ref 0.2–1.0)
Urobilinogen: 0.2 mg/dL (ref 0.2–1.0)
pH (UA): 7 (ref 5.0–7.5)
pH, UA: 7 NA (ref 5.0–7.5)

## 2017-09-15 LAB — HEPATITIS PANEL, ACUTE
HCV Ab: <0.1 s/co ratio (ref 0.0–0.9)
Hep A IgM: NEGATIVE
Hep B Core Ab, IgM: NEGATIVE
Hep B Core Ab, IgM: NEGATIVE
Hep B surface Ag screen: NEGATIVE
Hep C Virus Ab: <0.1 s/co ratio (ref 0.0–0.9)
Hepatitis A Ab, IgM: NEGATIVE
Hepatitis B Surface Ag: NEGATIVE

## 2017-09-15 LAB — COMPREHENSIVE METABOLIC PANEL
ALT: 26 IU/L (ref 0–32)
AST: 32 IU/L (ref 0–40)
Albumin/Globulin Ratio: 1.8 NA (ref 1.2–2.2)
Albumin: 5.1 g/dL (ref 3.5–5.5)
Alkaline Phosphatase: 95 IU/L (ref 39–117)
BUN: 13 mg/dL (ref 6–24)
Bun/Cre Ratio: 19 NA (ref 9–23)
CO2: 23 mmol/L (ref 20–29)
Calcium: 10.1 mg/dL (ref 8.7–10.2)
Chloride: 99 mmol/L (ref 96–106)
Creatinine: 0.69 mg/dL (ref 0.57–1.00)
EGFR IF NonAfrican American: 101 mL/min/{1.73_m2} (ref >59)
GFR African American: 117 mL/min/{1.73_m2} (ref >59)
Globulin, Total: 2.9 g/dL (ref 1.5–4.5)
Glucose: 81 mg/dL (ref 65–99)
Potassium: 4.7 mmol/L (ref 3.5–5.2)
Sodium: 139 mmol/L (ref 134–144)
Total Bilirubin: 1.1 mg/dL (ref 0.0–1.2)
Total Protein: 8 g/dL (ref 6.0–8.5)

## 2017-10-07 ENCOUNTER — Encounter

## 2017-10-09 MED ORDER — LEVOTHYROXINE 100 MCG TAB
100 mcg | ORAL_TABLET | ORAL | 1 refills | Status: DC
Start: 2017-10-09 — End: 2018-03-28

## 2017-11-01 ENCOUNTER — Encounter

## 2017-11-03 NOTE — Telephone Encounter (Signed)
No longer at practice. Will send referral request to practice.    Aarian Cleaver, DO

## 2017-11-13 ENCOUNTER — Ambulatory Visit
Admit: 2017-11-13 | Payer: PRIVATE HEALTH INSURANCE | Attending: Family Medicine | Primary: Student in an Organized Health Care Education/Training Program

## 2017-11-13 ENCOUNTER — Ambulatory Visit: Attending: Family Medicine | Primary: Student in an Organized Health Care Education/Training Program

## 2017-11-13 DIAGNOSIS — R0789 Other chest pain: Secondary | ICD-10-CM

## 2017-11-13 MED ORDER — OXYCODONE-ACETAMINOPHEN 5 MG-325 MG TAB
5-325 mg | ORAL_TABLET | Freq: Every evening | ORAL | 0 refills | Status: AC | PRN
Start: 2017-11-13 — End: 2017-12-03

## 2017-11-13 MED ORDER — HYDROCHLOROTHIAZIDE 12.5 MG TAB
12.5 mg | ORAL_TABLET | Freq: Every day | ORAL | 1 refills | Status: DC
Start: 2017-11-13 — End: 2018-03-28
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
## 2017-11-30 ENCOUNTER — Other Ambulatory Visit (HOSPITAL_COMMUNITY): Payer: Self-pay | Admitting: Family Medicine

## 2017-11-30 DIAGNOSIS — Z1231 Encounter for screening mammogram for malignant neoplasm of breast: Secondary | ICD-10-CM

## 2017-12-04 ENCOUNTER — Ambulatory Visit (HOSPITAL_COMMUNITY)
Admission: RE | Admit: 2017-12-04 | Discharge: 2017-12-04 | Disposition: A | Discharge status: Home / Self Care | Payer: BLUE CROSS/BLUE SHIELD | Source: Ambulatory Visit | Attending: Family Medicine | Admitting: Family Medicine

## 2017-12-04 ENCOUNTER — Encounter (HOSPITAL_COMMUNITY): Payer: Self-pay | Admitting: Radiology

## 2017-12-04 DIAGNOSIS — Z1231 Encounter for screening mammogram for malignant neoplasm of breast: Secondary | ICD-10-CM

## 2018-03-20 ENCOUNTER — Ambulatory Visit: Admit: 2018-03-20 | Payer: PRIVATE HEALTH INSURANCE | Attending: Family | Primary: Family Medicine

## 2018-03-20 ENCOUNTER — Inpatient Hospital Stay: Admit: 2018-03-20 | Payer: BLUE CROSS/BLUE SHIELD | Primary: Family Medicine

## 2018-03-20 ENCOUNTER — Ambulatory Visit: Attending: Family | Primary: Family

## 2018-03-20 DIAGNOSIS — I10 Essential (primary) hypertension: Secondary | ICD-10-CM

## 2018-03-20 MED ORDER — FLUTICASONE 50 MCG/ACTUATION NASAL SPRAY, SUSP
50 mcg/actuation | Freq: Every day | NASAL | 3 refills | Status: DC
Start: 2018-03-20 — End: 2018-11-07

## 2018-03-20 MED ORDER — MONTELUKAST 10 MG TAB
10 mg | ORAL_TABLET | Freq: Every day | ORAL | 2 refills | Status: AC
Start: 2018-03-20 — End: 2018-06-18

## 2018-03-20 MED ORDER — LORATADINE 10 MG TAB
10 mg | ORAL_TABLET | Freq: Every day | ORAL | 0 refills | Status: DC
Start: 2018-03-20 — End: 2018-11-07

## 2018-03-20 NOTE — Patient Instructions (Signed)
High Blood Pressure: Care Instructions  Overview    It's normal for blood pressure to go up and down throughout the day. But if it stays up, you have high blood pressure. Another name for high blood pressure is hypertension.  Despite what a lot of people think, high blood pressure usually doesn't cause headaches or make you feel dizzy or lightheaded. It usually has no symptoms. But it does increase your risk of stroke, heart attack, and other problems. You and your doctor will talk about your risks of these problems based on your blood pressure.  Your doctor will give you a goal for your blood pressure. Your goal will be based on your health and your age.  Lifestyle changes, such as eating healthy and being active, are always important to help lower blood pressure. You might also take medicine to reach your blood pressure goal.  Follow-up care is a key part of your treatment and safety. Be sure to make and go to all appointments, and call your doctor if you are having problems. It's also a good idea to know your test results and keep a list of the medicines you take.  How can you care for yourself at home?  Medical treatment  ?? If you stop taking your medicine, your blood pressure will go back up. You may take one or more types of medicine to lower your blood pressure. Be safe with medicines. Take your medicine exactly as prescribed. Call your doctor if you think you are having a problem with your medicine.  ?? Talk to your doctor before you start taking aspirin every day. Aspirin can help certain people lower their risk of a heart attack or stroke. But taking aspirin isn't right for everyone, because it can cause serious bleeding.  ?? See your doctor regularly. You may need to see the doctor more often at first or until your blood pressure comes down.  ?? If you are taking blood pressure medicine, talk to your doctor before you take decongestants or anti-inflammatory medicine, such as ibuprofen.  Some of these medicines can raise blood pressure.  ?? Learn how to check your blood pressure at home.  Lifestyle changes  ?? Stay at a healthy weight. This is especially important if you put on weight around the waist. Losing even 10 pounds can help you lower your blood pressure.  ?? If your doctor recommends it, get more exercise. Walking is a good choice. Bit by bit, increase the amount you walk every day. Try for at least 30 minutes on most days of the week. You also may want to swim, bike, or do other activities.  ?? Avoid or limit alcohol. Talk to your doctor about whether you can drink any alcohol.  ?? Try to limit how much sodium you eat to less than 2,300 milligrams (mg) a day. Your doctor may ask you to try to eat less than 1,500 mg a day.  ?? Eat plenty of fruits (such as bananas and oranges), vegetables, legumes, whole grains, and low-fat dairy products.  ?? Lower the amount of saturated fat in your diet. Saturated fat is found in animal products such as milk, cheese, and meat. Limiting these foods may help you lose weight and also lower your risk for heart disease.  ?? Do not smoke. Smoking increases your risk for heart attack and stroke. If you need help quitting, talk to your doctor about stop-smoking programs and medicines. These can increase your chances of quitting for good.  When   should you call for help?  Call  911 anytime you think you may need emergency care. This may mean having symptoms that suggest that your blood pressure is causing a serious heart or blood vessel problem. Your blood pressure may be over 180/120.  ??For example, call  911 if:  ?? ?? You have symptoms of a heart attack. These may include:  ? Chest pain or pressure, or a strange feeling in the chest.  ? Sweating.  ? Shortness of breath.  ? Nausea or vomiting.  ? Pain, pressure, or a strange feeling in the back, neck, jaw, or upper belly or in one or both shoulders or arms.  ? Lightheadedness or sudden weakness.   ? A fast or irregular heartbeat.   ?? ?? You have symptoms of a stroke. These may include:  ? Sudden numbness, tingling, weakness, or loss of movement in your face, arm, or leg, especially on only one side of your body.  ? Sudden vision changes.  ? Sudden trouble speaking.  ? Sudden confusion or trouble understanding simple statements.  ? Sudden problems with walking or balance.  ? A sudden, severe headache that is different from past headaches.   ?? ?? You have severe back or belly pain.   ??Do not wait until your blood pressure comes down on its own. Get help right away.  ??Call your doctor now or seek immediate care if:  ?? ?? Your blood pressure is much higher than normal (such as 180/120 or higher), but you don't have symptoms.   ?? ?? You think high blood pressure is causing symptoms, such as:  ? Severe headache.  ? Blurry vision.   ??Watch closely for changes in your health, and be sure to contact your doctor if:  ?? ?? Your blood pressure measures higher than your doctor recommends at least 2 times. That means the top number is higher or the bottom number is higher, or both.   ?? ?? You think you may be having side effects from your blood pressure medicine.   Where can you learn more?  Go to http://www.healthwise.net/[employer].  Enter X567 in the search box to learn more about "High Blood Pressure: Care Instructions."  Current as of: April 18, 2017  Content Version: 12.2  ?? 2006-2019 [employer]. Care instructions adapted under license by [employer] (which disclaims liability or warranty for this information). If you have questions about a medical condition or this instruction, always ask your healthcare professional. [employer] disclaims any warranty or liability for your use of this information.         High Blood Pressure: Care Instructions  Overview    It's normal for blood pressure to go up and down throughout the day. But  if it stays up, you have high blood pressure. Another name for high blood pressure is hypertension.  Despite what a lot of people think, high blood pressure usually doesn't cause headaches or make you feel dizzy or lightheaded. It usually has no symptoms. But it does increase your risk of stroke, heart attack, and other problems. You and your doctor will talk about your risks of these problems based on your blood pressure.  Your doctor will give you a goal for your blood pressure. Your goal will be based on your health and your age.  Lifestyle changes, such as eating healthy and being active, are always important to help lower blood pressure. You might also take medicine to reach your blood pressure goal.    Follow-up care is a key part of your treatment and safety. Be sure to make and go to all appointments, and call your doctor if you are having problems. It's also a good idea to know your test results and keep a list of the medicines you take.  How can you care for yourself at home?  Medical treatment  ?? If you stop taking your medicine, your blood pressure will go back up. You may take one or more types of medicine to lower your blood pressure. Be safe with medicines. Take your medicine exactly as prescribed. Call your doctor if you think you are having a problem with your medicine.  ?? Talk to your doctor before you start taking aspirin every day. Aspirin can help certain people lower their risk of a heart attack or stroke. But taking aspirin isn't right for everyone, because it can cause serious bleeding.  ?? See your doctor regularly. You may need to see the doctor more often at first or until your blood pressure comes down.  ?? If you are taking blood pressure medicine, talk to your doctor before you take decongestants or anti-inflammatory medicine, such as ibuprofen. Some of these medicines can raise blood pressure.  ?? Learn how to check your blood pressure at home.  Lifestyle changes   ?? Stay at a healthy weight. This is especially important if you put on weight around the waist. Losing even 10 pounds can help you lower your blood pressure.  ?? If your doctor recommends it, get more exercise. Walking is a good choice. Bit by bit, increase the amount you walk every day. Try for at least 30 minutes on most days of the week. You also may want to swim, bike, or do other activities.  ?? Avoid or limit alcohol. Talk to your doctor about whether you can drink any alcohol.  ?? Try to limit how much sodium you eat to less than 2,300 milligrams (mg) a day. Your doctor may ask you to try to eat less than 1,500 mg a day.  ?? Eat plenty of fruits (such as bananas and oranges), vegetables, legumes, whole grains, and low-fat dairy products.  ?? Lower the amount of saturated fat in your diet. Saturated fat is found in animal products such as milk, cheese, and meat. Limiting these foods may help you lose weight and also lower your risk for heart disease.  ?? Do not smoke. Smoking increases your risk for heart attack and stroke. If you need help quitting, talk to your doctor about stop-smoking programs and medicines. These can increase your chances of quitting for good.  When should you call for help?  Call  911 anytime you think you may need emergency care. This may mean having symptoms that suggest that your blood pressure is causing a serious heart or blood vessel problem. Your blood pressure may be over 180/120.  ??For example, call  911 if:  ?? ?? You have symptoms of a heart attack. These may include:  ? Chest pain or pressure, or a strange feeling in the chest.  ? Sweating.  ? Shortness of breath.  ? Nausea or vomiting.  ? Pain, pressure, or a strange feeling in the back, neck, jaw, or upper belly or in one or both shoulders or arms.  ? Lightheadedness or sudden weakness.  ? A fast or irregular heartbeat.   ?? ?? You have symptoms of a stroke. These may include:   ? Sudden numbness, tingling, weakness, or loss of movement   in your face, arm, or leg, especially on only one side of your body.  ? Sudden vision changes.  ? Sudden trouble speaking.  ? Sudden confusion or trouble understanding simple statements.  ? Sudden problems with walking or balance.  ? A sudden, severe headache that is different from past headaches.   ?? ?? You have severe back or belly pain.   ??Do not wait until your blood pressure comes down on its own. Get help right away.  ??Call your doctor now or seek immediate care if:  ?? ?? Your blood pressure is much higher than normal (such as 180/120 or higher), but you don't have symptoms.   ?? ?? You think high blood pressure is causing symptoms, such as:  ? Severe headache.  ? Blurry vision.   ??Watch closely for changes in your health, and be sure to contact your doctor if:  ?? ?? Your blood pressure measures higher than your doctor recommends at least 2 times. That means the top number is higher or the bottom number is higher, or both.   ?? ?? You think you may be having side effects from your blood pressure medicine.   Where can you learn more?  Go to http://www.healthwise.net/[employer]HelpConnections.  Enter X567 in the search box to learn more about "High Blood Pressure: Care Instructions."  Current as of: April 18, 2017  Content Version: 12.2  ?? 2006-2019 [employer]. Care instructions adapted under license by [employer] (which disclaims liability or warranty for this information). If you have questions about a medical condition or this instruction, always ask your healthcare professional. [employer] disclaims any warranty or liability for your use of this information.

## 2018-03-20 NOTE — Progress Notes (Signed)
Patient stated name & dob    Chief Complaint   Patient presents with   ??? URI     Started about 1 week ago   Last month [facility]   Prescribed something but doesn't remember    No coughing    Ear, throat is itching    Body aches, no fever     History of Allergies     OTC Claritin 10mg    ??? Leg Pain     Right leg swelling & pain    Started about 1 week    No injury     Etiology unknown    OTC Ibuprofen 100mg         Health Maintenance Due   Topic   ??? DTaP/Tdap/Td series (1 - Tdap)   ??? PAP AKA CERVICAL CYTOLOGY    ??? Shingrix Vaccine Age 17> (1 of 2)   ??? FOBT Q1Y Age 59-75    ??? Influenza Age 80 to Adult        Wt Readings from Last 3 Encounters:   03/20/18 176 lb (79.8 kg)   11/13/17 176 lb (79.8 kg)   09/14/17 173 lb (78.5 kg)     Temp Readings from Last 3 Encounters:   11/13/17 98 ??F (36.7 ??C) (Oral)   09/14/17 98.2 ??F (36.8 ??C) (Oral)   08/11/17 98.3 ??F (36.8 ??C) (Oral)     BP Readings from Last 3 Encounters:   11/13/17 119/75   09/14/17 137/83   08/11/17 136/83     Pulse Readings from Last 3 Encounters:   11/13/17 77   09/14/17 74   08/11/17 73         Learning Assessment:  :     Learning Assessment 01/13/2017   PRIMARY LEARNER Patient   PRIMARY LANGUAGE SPANISH   LEARNER PREFERENCE PRIMARY DEMONSTRATION   ANSWERED BY patient   RELATIONSHIP SELF       Depression Screening:  :     3 most recent PHQ Screens 03/20/2018   Little interest or pleasure in doing things Not at all   Feeling down, depressed, irritable, or hopeless Not at all   Total Score PHQ 2 0   Trouble falling or staying asleep, or sleeping too much -   Feeling tired or having little energy -   Poor appetite, weight loss, or overeating -   Feeling bad about yourself - or that you are a failure or have let yourself or your family down -   Trouble concentrating on things such as school, work, reading, or watching TV -   Moving or speaking so slowly that other people could have noticed; or the opposite being so fidgety that others notice -    Thoughts of being better off dead, or hurting yourself in some way -   PHQ 9 Score -   How difficult have these problems made it for you to do your work, take care of your home and get along with others -       Fall Risk Assessment:  :     No flowsheet data found.    Abuse Screening:  :     Abuse Screening Questionnaire 01/13/2017   Do you ever feel afraid of your partner? N   Are you in a relationship with someone who physically or mentally threatens you? N   Is it safe for you to go home? Y       Coordination of Care Questionnaire:  :  1) Have you been to an emergency room, urgent care clinic since your last visit? Yes   [facility] last month URI    Hospitalized since your last visit? No             2) Have you seen or consulted any other health care providers outside of [facility] since your last visit? No  (Include any pap smears or colon screenings in this section.)    Patient is accompanied by self I have received verbal consent from Jae Dire to discuss any/all medical information while they are present in the room.

## 2018-03-20 NOTE — Progress Notes (Signed)
Assessment/Plan:     Diagnoses and all orders for this visit:    1. Essential hypertension  -     METABOLIC PANEL, COMPREHENSIVE; Future  -     MICROALBUMIN, UR, RAND W/ MICROALB/CREAT RATIO; Future  - Stable, continue current therapy, labs today    2. History of seasonal allergies  -     fluticasone propionate (FLONASE) 50 mcg/actuation nasal spray; 1 Spray by Both Nostrils route daily.  -     montelukast (SINGULAIR) 10 mg tablet; Take 1 Tab by mouth daily for 90 days.  -     loratadine (CLARITIN) 10 mg tablet; Take 1 Tab by mouth daily.  - Worsening, start flonase today as directed, and start daily singulair as directed.  Continue claritin.  RTC if symptoms persist.  Will then send to Allergist.    3. Impaired glucose tolerance  -     LIPID PANEL; Future  -     HEMOGLOBIN A1C WITH EAG; Future  - Presumed stable, labs today, continue current therapy    4. Primary hypothyroidism  -     TSH 3RD GENERATION; Future  - Presumed stable, labs today        Follow-up and Dispositions    ?? Return in about 6 months (around 09/20/2018) for Follow Up.         Discussed expected course/resolution/complications of diagnosis in detail with patient.    Medication risks/benefits/costs/interactions/alternatives discussed with patient.    Pt was given after visit summary which includes diagnoses, current medications & vitals.   Pt expressed understanding with the diagnosis and plan        Subjective:      Jacqueline Mckinney is a 53 y.o. female who presents for had concerns including URI (Started about 1 week ago   Last month went to [facility]   Prescribed something but doesn't remember    No coughing    Ear, throat is itching    Body aches, no fever     History of Allergies     OTC Claritin 78m) and Leg Pain (Right leg swelling & pain    Started about 1 week    No injury     Etiology unknown    OTC Ibuprofen 1071m).     Upper Respiratory Infection  Patient complains of symptoms of a URI. Symptoms include ears, eyes and  throat are itching. Onset of symptoms was 1 week ago, unchanged since that time. She also c/o no  fever for the past   .  She is drinking plenty of fluids.. Evaluation to date: none. Treatment to date: claritin.    Also due for labs.   HTN  BP is 122/84.  Does not check BP at home.  Takes HCTZ 12.49m73ms directed with no reported side effects.  Denies CP, SOB, palpitations or leg swelling    Hypothyroidism  Takes 100m31mf levothyroxine as directed and does not miss doses    Current Outpatient Medications   Medication Sig Dispense Refill   ??? fluticasone propionate (FLONASE) 50 mcg/actuation nasal spray 1 Spray by Both Nostrils route daily. 1 Bottle 3   ??? montelukast (SINGULAIR) 10 mg tablet Take 1 Tab by mouth daily for 90 days. 30 Tab 2   ??? loratadine (CLARITIN) 10 mg tablet Take 1 Tab by mouth daily. 90 Tab 0   ??? hydroCHLOROthiazide (HYDRODIURIL) 12.5 mg tablet Take 1 Tab by mouth daily. 90 Tab 1   ??? levothyroxine (SYNTHROID) 100 mcg tablet  TAKE 1 TABLET BY MOUTH ONCE DAILY BEFORE BREAKFAST 90 Tab 1   ??? ibuprofen (MOTRIN) 800 mg tablet Take 1 Tab by mouth every eight (8) hours as needed for Pain. 30 Tab 11   ??? meclizine (ANTIVERT) 25 mg tablet TK 1 TO 2 TS PO QID PRN FOR DIZZINESS  0   ??? clotrimazole-betamethasone (LOTRISONE) topical cream APPLY TO THE AFFECTED AREA TWICE DAILY  1       No Known Allergies  Past Medical History:   Diagnosis Date   ??? H/O seasonal allergies    ??? Hyperlipidemia    ??? Hypertension    ??? Hypothyroid    ??? Impaired glucose tolerance      Past Surgical History:   Procedure Laterality Date   ??? 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 HX TONSIL AND ADENOIDECTOMY     ??? PR EGD DELIVER THERMAL ENERGY SPHNCTR/CARDIA GERD       Family History   Problem Relation Age of Onset   ??? Diabetes Mother      Social History     Socioeconomic History   ??? Marital status: SINGLE     Spouse name: Not on file   ??? Number of children: Not on file   ??? Years of education: Not on file    ??? Highest education level: Not on file   Occupational History   ??? Not on file   Social Needs   ??? Financial resource strain: Not on file   ??? Food insecurity     Worry: Not on file     Inability: Not on file   ??? Transportation needs     Medical: Not on file     Non-medical: Not on file   Tobacco Use   ??? Smoking status: Never Smoker   ??? Smokeless tobacco: Never Used   Substance and Sexual Activity   ??? Alcohol use: No   ??? Drug use: No   ??? Sexual activity: Never   Lifestyle   ??? Physical activity     Days per week: Not on file     Minutes per session: Not on file   ??? Stress: Not on file   Relationships   ??? Social Product manager on phone: Not on file     Gets together: Not on file     Attends religious service: Not on file     Active member of club or organization: Not on file     Attends meetings of clubs or organizations: Not on file     Relationship status: Not on file   ??? Intimate partner violence     Fear of current or ex partner: Not on file     Emotionally abused: Not on file     Physically abused: Not on file     Forced sexual activity: Not on file   Other Topics Concern   ??? Not on file   Social History Narrative   ??? Not on file       HPI      ROS:   Review of Systems   Constitutional: Negative for chills, fever and malaise/fatigue.   HENT: Positive for congestion. Negative for ear pain and sinus pain.         Eyes and ears itching   Eyes: Negative for blurred vision.   Respiratory: Negative for cough, sputum production and shortness of breath.  Cardiovascular: Negative for chest pain, palpitations and leg swelling.   Neurological: Negative for dizziness and headaches.       Objective:     Visit Vitals  BP 122/84   Pulse 75   Temp 98.3 ??F (36.8 ??C) (Oral)   Resp 12   Ht 5' 7"  (1.702 m)   Wt 168 lb 3.2 oz (76.3 kg)   SpO2 100%   BMI 26.34 kg/m??         Vitals and Nurse Documentation reviewed.     Physical Exam  Constitutional:       General: She is not in acute distress.  HENT:       Right Ear: No middle ear effusion. Tympanic membrane is not erythematous or bulging.      Left Ear:  No middle ear effusion. Tympanic membrane is not erythematous or bulging.      Nose: No mucosal edema.      Right Sinus: No maxillary sinus tenderness or frontal sinus tenderness.      Left Sinus: No maxillary sinus tenderness or frontal sinus tenderness.      Mouth/Throat:      Pharynx: No oropharyngeal exudate or posterior oropharyngeal erythema.   Cardiovascular:      Heart sounds: S1 normal and S2 normal. No murmur. No friction rub. No gallop.    Pulmonary:      Effort: No respiratory distress.      Breath sounds: Normal breath sounds. No wheezing.   Lymphadenopathy:      Cervical: No cervical adenopathy.   Neurological:      Mental Status: She is oriented to person, place, and time.         Results for orders placed or performed in visit on 21/30/86   METABOLIC PANEL, COMPREHENSIVE   Result Value Ref Range    Glucose 81 65 - 99 mg/dL    BUN 13 6 - 24 mg/dL    Creatinine 0.69 0.57 - 1.00 mg/dL    GFR est non-AA 101 >59 mL/min/1.73    GFR est AA 117 >59 mL/min/1.73    BUN/Creatinine ratio 19 9 - 23    Sodium 139 134 - 144 mmol/L    Potassium 4.7 3.5 - 5.2 mmol/L    Chloride 99 96 - 106 mmol/L    CO2 23 20 - 29 mmol/L    Calcium 10.1 8.7 - 10.2 mg/dL    Protein, total 8.0 6.0 - 8.5 g/dL    Albumin 5.1 3.5 - 5.5 g/dL    GLOBULIN, TOTAL 2.9 1.5 - 4.5 g/dL    A-G Ratio 1.8 1.2 - 2.2    Bilirubin, total 1.1 0.0 - 1.2 mg/dL    Alk. phosphatase 95 39 - 117 IU/L    AST (SGOT) 32 0 - 40 IU/L    ALT (SGPT) 26 0 - 32 IU/L   HEPATITIS PANEL, ACUTE   Result Value Ref Range    Hepatitis A Ab, IgM Negative Negative    Hep B surface Ag screen Negative Negative    Hep B Core Ab, IgM Negative Negative    Hep C Virus Ab <0.1 0.0 - 0.9 s/co ratio   URINALYSIS W/ RFLX MICROSCOPIC   Result Value Ref Range    Specific Gravity 1.018 1.005 - 1.030    pH (UA) 7.0 5.0 - 7.5    Color Yellow Yellow    Appearance Clear Clear     Leukocyte Esterase Negative Negative    Protein Negative Negative/Trace  Glucose Negative Negative    Ketone Negative Negative    Blood Negative Negative    Bilirubin Negative Negative    Urobilinogen 0.2 0.2 - 1.0 mg/dL    Nitrites Negative Negative    Microscopic Examination Comment    AMB POC GLUCOSE BLOOD, BY GLUCOSE MONITORING DEVICE   Result Value Ref Range    Glucose POC 73 mg/dL

## 2018-03-20 NOTE — Progress Notes (Signed)
Please have patient return for elevated TSH

## 2018-03-20 NOTE — Progress Notes (Signed)
Please have patient return for elevated TSH

## 2018-03-20 NOTE — Progress Notes (Signed)
Patient stated name & dob    Chief Complaint   Patient presents with   . URI     Started about 1 week ago   Last month went to [facility]   Prescribed something but doesn't remember    No coughing    Ear, throat is itching    Body aches, no fever     History of Allergies     OTC Claritin 10mg    . Leg Pain     Right leg swelling & pain    Started about 1 week    No injury     Etiology unknown    OTC Ibuprofen 100mg         Health Maintenance Due   Topic   . DTaP/Tdap/Td series (1 - Tdap)   . PAP AKA CERVICAL CYTOLOGY    . Shingrix Vaccine Age 52> (1 of 2)   . FOBT Q1Y Age 63-75    . Influenza Age 61 to Adult        Wt Readings from Last 3 Encounters:   03/20/18 176 lb (79.8 kg)   11/13/17 176 lb (79.8 kg)   09/14/17 173 lb (78.5 kg)     Temp Readings from Last 3 Encounters:   11/13/17 98 F (36.7 C) (Oral)   09/14/17 98.2 F (36.8 C) (Oral)   08/11/17 98.3 F (36.8 C) (Oral)     BP Readings from Last 3 Encounters:   11/13/17 119/75   09/14/17 137/83   08/11/17 136/83     Pulse Readings from Last 3 Encounters:   11/13/17 77   09/14/17 74   08/11/17 73         Learning Assessment:  :     Learning Assessment 01/13/2017   PRIMARY LEARNER Patient   PRIMARY LANGUAGE SPANISH   LEARNER PREFERENCE PRIMARY DEMONSTRATION   ANSWERED BY patient   RELATIONSHIP SELF       Depression Screening:  :     3 most recent PHQ Screens 03/20/2018   Little interest or pleasure in doing things Not at all   Feeling down, depressed, irritable, or hopeless Not at all   Total Score PHQ 2 0   Trouble falling or staying asleep, or sleeping too much -   Feeling tired or having little energy -   Poor appetite, weight loss, or overeating -   Feeling bad about yourself - or that you are a failure or have let yourself or your family down -   Trouble concentrating on things such as school, work, reading, or watching TV -   Moving or speaking so slowly that other people could have noticed; or the opposite being so fidgety that others notice -   Thoughts of being  better off dead, or hurting yourself in some way -   PHQ 9 Score -   How difficult have these problems made it for you to do your work, take care of your home and get along with others -       Fall Risk Assessment:  :     No flowsheet data found.    Abuse Screening:  :     Abuse Screening Questionnaire 01/13/2017   Do you ever feel afraid of your partner? N   Are you in a relationship with someone who physically or mentally threatens you? N   Is it safe for you to go home? Y       Coordination of Care Questionnaire:  :  1) Have you been to an emergency room, urgent care clinic since your last visit? Yes   [facility] last month URI    Hospitalized since your last visit? No             2) Have you seen or consulted any other health care providers outside of [facility] since your last visit? No  (Include any pap smears or colon screenings in this section.)    Patient is accompanied by self I have received verbal consent from Jae Dire to discuss any/all medical information while they are present in the room.

## 2018-03-20 NOTE — Progress Notes (Signed)
Assessment/Plan:     Diagnoses and all orders for this visit:    1. Essential hypertension  -     METABOLIC PANEL, COMPREHENSIVE; Future  -     MICROALBUMIN, UR, RAND W/ MICROALB/CREAT RATIO; Future  - Stable, continue current therapy, labs today    2. History of seasonal allergies  -     fluticasone propionate (FLONASE) 50 mcg/actuation nasal spray; 1 Spray by Both Nostrils route daily.  -     montelukast (SINGULAIR) 10 mg tablet; Take 1 Tab by mouth daily for 90 days.  -     loratadine (CLARITIN) 10 mg tablet; Take 1 Tab by mouth daily.  - Worsening, start flonase today as directed, and start daily singulair as directed.  Continue claritin.  RTC if symptoms persist.  Will then send to Allergist.    3. Impaired glucose tolerance  -     LIPID PANEL; Future  -     HEMOGLOBIN A1C WITH EAG; Future  - Presumed stable, labs today, continue current therapy    4. Primary hypothyroidism  -     TSH 3RD GENERATION; Future  - Presumed stable, labs today        Follow-up and Dispositions    ?? Return in about 6 months (around 09/20/2018) for Follow Up.         Discussed expected course/resolution/complications of diagnosis in detail with patient.    Medication risks/benefits/costs/interactions/alternatives discussed with patient.    Pt was given after visit summary which includes diagnoses, current medications & vitals.   Pt expressed understanding with the diagnosis and plan        Subjective:      Jacqueline Mckinney is a 53 y.o. female who presents for had concerns including URI (Started about 1 week ago   Last month went to [facility]   Prescribed something but doesn't remember    No coughing    Ear, throat is itching    Body aches, no fever     History of Allergies     OTC Claritin '10mg'$ ) and Leg Pain (Right leg swelling & pain    Started about 1 week    No injury     Etiology unknown    OTC Ibuprofen '100mg'$  ).     Upper Respiratory Infection  Patient complains of symptoms of a URI. Symptoms include ears, eyes and throat are itching.  Onset of symptoms was 1 week ago, unchanged since that time. She also c/o no  fever for the past   .  She is drinking plenty of fluids.. Evaluation to date: none. Treatment to date: claritin.    Also due for labs.   HTN  BP is 122/84.  Does not check BP at home.  Takes HCTZ 12.'5mg'$  as directed with no reported side effects.  Denies CP, SOB, palpitations or leg swelling    Hypothyroidism  Takes 128mg of levothyroxine as directed and does not miss doses    Current Outpatient Medications   Medication Sig Dispense Refill   ??? fluticasone propionate (FLONASE) 50 mcg/actuation nasal spray 1 Spray by Both Nostrils route daily. 1 Bottle 3   ??? montelukast (SINGULAIR) 10 mg tablet Take 1 Tab by mouth daily for 90 days. 30 Tab 2   ??? loratadine (CLARITIN) 10 mg tablet Take 1 Tab by mouth daily. 90 Tab 0   ??? hydroCHLOROthiazide (HYDRODIURIL) 12.5 mg tablet Take 1 Tab by mouth daily. 90 Tab 1   ??? levothyroxine (SYNTHROID) 100 mcg tablet  TAKE 1 TABLET BY MOUTH ONCE DAILY BEFORE BREAKFAST 90 Tab 1   ??? ibuprofen (MOTRIN) 800 mg tablet Take 1 Tab by mouth every eight (8) hours as needed for Pain. 30 Tab 11   ??? meclizine (ANTIVERT) 25 mg tablet TK 1 TO 2 TS PO QID PRN FOR DIZZINESS  0   ??? clotrimazole-betamethasone (LOTRISONE) topical cream APPLY TO THE AFFECTED AREA TWICE DAILY  1       No Known Allergies  Past Medical History:   Diagnosis Date   ??? H/O seasonal allergies    ??? Hyperlipidemia    ??? Hypertension    ??? Hypothyroid    ??? Impaired glucose tolerance      Past Surgical History:   Procedure Laterality Date   ??? 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 HX TONSIL AND ADENOIDECTOMY     ??? PR EGD DELIVER THERMAL ENERGY SPHNCTR/CARDIA GERD       Family History   Problem Relation Age of Onset   ??? Diabetes Mother      Social History     Socioeconomic History   ??? Marital status: SINGLE     Spouse name: Not on file   ??? Number of children: Not on file   ??? Years of education: Not on file   ??? Highest education  level: Not on file   Occupational History   ??? Not on file   Social Needs   ??? Financial resource strain: Not on file   ??? Food insecurity     Worry: Not on file     Inability: Not on file   ??? Transportation needs     Medical: Not on file     Non-medical: Not on file   Tobacco Use   ??? Smoking status: Never Smoker   ??? Smokeless tobacco: Never Used   Substance and Sexual Activity   ??? Alcohol use: No   ??? Drug use: No   ??? Sexual activity: Never   Lifestyle   ??? Physical activity     Days per week: Not on file     Minutes per session: Not on file   ??? Stress: Not on file   Relationships   ??? Social Product manager on phone: Not on file     Gets together: Not on file     Attends religious service: Not on file     Active member of club or organization: Not on file     Attends meetings of clubs or organizations: Not on file     Relationship status: Not on file   ??? Intimate partner violence     Fear of current or ex partner: Not on file     Emotionally abused: Not on file     Physically abused: Not on file     Forced sexual activity: Not on file   Other Topics Concern   ??? Not on file   Social History Narrative   ??? Not on file       HPI      ROS:   Review of Systems   Constitutional: Negative for chills, fever and malaise/fatigue.   HENT: Positive for congestion. Negative for ear pain and sinus pain.         Eyes and ears itching   Eyes: Negative for blurred vision.   Respiratory: Negative for cough, sputum production and shortness of breath.  Cardiovascular: Negative for chest pain, palpitations and leg swelling.   Neurological: Negative for dizziness and headaches.       Objective:     Visit Vitals  BP 122/84   Pulse 75   Temp 98.3 ??F (36.8 ??C) (Oral)   Resp 12   Ht '5\' 7"'$  (1.702 m)   Wt 168 lb 3.2 oz (76.3 kg)   SpO2 100%   BMI 26.34 kg/m??         Vitals and Nurse Documentation reviewed.     Physical Exam  Constitutional:       General: She is not in acute distress.  HENT:      Right Ear: No middle ear effusion. Tympanic  membrane is not erythematous or bulging.      Left Ear:  No middle ear effusion. Tympanic membrane is not erythematous or bulging.      Nose: No mucosal edema.      Right Sinus: No maxillary sinus tenderness or frontal sinus tenderness.      Left Sinus: No maxillary sinus tenderness or frontal sinus tenderness.      Mouth/Throat:      Pharynx: No oropharyngeal exudate or posterior oropharyngeal erythema.   Cardiovascular:      Heart sounds: S1 normal and S2 normal. No murmur. No friction rub. No gallop.    Pulmonary:      Effort: No respiratory distress.      Breath sounds: Normal breath sounds. No wheezing.   Lymphadenopathy:      Cervical: No cervical adenopathy.   Neurological:      Mental Status: She is oriented to person, place, and time.         Results for orders placed or performed in visit on 62/37/62   METABOLIC PANEL, COMPREHENSIVE   Result Value Ref Range    Glucose 81 65 - 99 mg/dL    BUN 13 6 - 24 mg/dL    Creatinine 0.69 0.57 - 1.00 mg/dL    GFR est non-AA 101 >59 mL/min/1.73    GFR est AA 117 >59 mL/min/1.73    BUN/Creatinine ratio 19 9 - 23    Sodium 139 134 - 144 mmol/L    Potassium 4.7 3.5 - 5.2 mmol/L    Chloride 99 96 - 106 mmol/L    CO2 23 20 - 29 mmol/L    Calcium 10.1 8.7 - 10.2 mg/dL    Protein, total 8.0 6.0 - 8.5 g/dL    Albumin 5.1 3.5 - 5.5 g/dL    GLOBULIN, TOTAL 2.9 1.5 - 4.5 g/dL    A-G Ratio 1.8 1.2 - 2.2    Bilirubin, total 1.1 0.0 - 1.2 mg/dL    Alk. phosphatase 95 39 - 117 IU/L    AST (SGOT) 32 0 - 40 IU/L    ALT (SGPT) 26 0 - 32 IU/L   HEPATITIS PANEL, ACUTE   Result Value Ref Range    Hepatitis A Ab, IgM Negative Negative    Hep B surface Ag screen Negative Negative    Hep B Core Ab, IgM Negative Negative    Hep C Virus Ab <0.1 0.0 - 0.9 s/co ratio   URINALYSIS W/ RFLX MICROSCOPIC   Result Value Ref Range    Specific Gravity 1.018 1.005 - 1.030    pH (UA) 7.0 5.0 - 7.5    Color Yellow Yellow    Appearance Clear Clear    Leukocyte Esterase Negative Negative    Protein Negative  Negative/Trace  Glucose Negative Negative    Ketone Negative Negative    Blood Negative Negative    Bilirubin Negative Negative    Urobilinogen 0.2 0.2 - 1.0 mg/dL    Nitrites Negative Negative    Microscopic Examination Comment    AMB POC GLUCOSE BLOOD, BY GLUCOSE MONITORING DEVICE   Result Value Ref Range    Glucose POC 73 mg/dL

## 2018-03-21 LAB — METABOLIC PANEL, COMPREHENSIVE
A-G Ratio: 1.3 (ref 1.1–2.2)
ALT (SGPT): 34 U/L (ref 12–78)
AST (SGOT): 26 U/L (ref 15–37)
Albumin: 4.2 g/dL (ref 3.5–5.0)
Alk. phosphatase: 98 U/L (ref 45–117)
Anion gap: 5 mmol/L (ref 5–15)
BUN/Creatinine ratio: 27 — ABNORMAL HIGH (ref 12–20)
BUN: 17 MG/DL (ref 6–20)
Bilirubin, total: 0.8 MG/DL (ref 0.2–1.0)
CO2: 30 mmol/L (ref 21–32)
Calcium: 9.3 MG/DL (ref 8.5–10.1)
Chloride: 103 mmol/L (ref 97–108)
Creatinine: 0.62 MG/DL (ref 0.55–1.02)
GFR est AA: >60 mL/min/{1.73_m2} (ref >60)
GFR est non-AA: >60 mL/min/{1.73_m2} (ref >60)
Globulin: 3.2 g/dL (ref 2.0–4.0)
Glucose: 83 mg/dL (ref 65–100)
Potassium: 3.5 mmol/L (ref 3.5–5.1)
Protein, total: 7.4 g/dL (ref 6.4–8.2)
Sodium: 138 mmol/L (ref 136–145)

## 2018-03-21 LAB — HEMOGLOBIN A1C WITH EAG
Est. average glucose: 120 mg/dL
Hemoglobin A1c: 5.8 % — ABNORMAL HIGH (ref 4.0–5.6)

## 2018-03-21 LAB — LIPID PANEL
CHOL/HDL Ratio: 3.9 (ref 0.0–5.0)
Chol/HDL Ratio: 3.9 (ref 0.0–5.0)
Cholesterol, Total: 204 MG/DL — ABNORMAL HIGH (ref <200)
Cholesterol, total: 204 MG/DL — ABNORMAL HIGH (ref <200)
HDL Cholesterol: 52 MG/DL
HDL: 52 MG/DL
LDL Calculated: 109.6 MG/DL — ABNORMAL HIGH (ref 0–100)
LDL, calculated: 109.6 MG/DL — ABNORMAL HIGH (ref 0–100)
Triglyceride: 212 MG/DL — ABNORMAL HIGH (ref <150)
Triglycerides: 212 MG/DL — ABNORMAL HIGH (ref <150)
VLDL Cholesterol Calculated: 42.4 MG/DL
VLDL, calculated: 42.4 MG/DL

## 2018-03-21 LAB — MICROALBUMIN, UR, RAND W/ MICROALB/CREAT RATIO
Creatinine, urine random: 90.1 mg/dL
Microalbumin,urine random: 0.72 MG/DL
Microalbumin/Creat ratio (mg/g creat): 8 mg/g (ref 0–30)

## 2018-03-21 LAB — TSH 3RD GENERATION
TSH: 4.08 u[IU]/mL — ABNORMAL HIGH (ref 0.36–3.74)
TSH: 4.08 u[IU]/mL — ABNORMAL HIGH (ref 0.36–3.74)

## 2018-03-21 LAB — HEMOGLOBIN A1C W/EAG
Hemoglobin A1C: 5.8 % — ABNORMAL HIGH (ref 4.0–5.6)
eAG: 120 mg/dL

## 2018-03-21 LAB — MICROALBUMIN / CREATININE URINE RATIO
Creatinine, Ur: 90.1 mg/dL
Microalb, Ur: 0.72 MG/DL
Microalbumin Creatinine Ratio: 8 mg/g (ref 0–30)

## 2018-03-21 LAB — COMPREHENSIVE METABOLIC PANEL
ALT: 34 U/L (ref 12–78)
AST: 26 U/L (ref 15–37)
Albumin/Globulin Ratio: 1.3 (ref 1.1–2.2)
Albumin: 4.2 g/dL (ref 3.5–5.0)
Alkaline Phosphatase: 98 U/L (ref 45–117)
Anion Gap: 5 mmol/L (ref 5–15)
BUN: 17 MG/DL (ref 6–20)
Bun/Cre Ratio: 27 — ABNORMAL HIGH (ref 12–20)
CO2: 30 mmol/L (ref 21–32)
Calcium: 9.3 MG/DL (ref 8.5–10.1)
Chloride: 103 mmol/L (ref 97–108)
Creatinine: 0.62 MG/DL (ref 0.55–1.02)
EGFR IF NonAfrican American: >60 mL/min/{1.73_m2} (ref >60)
GFR African American: >60 mL/min/{1.73_m2} (ref >60)
Globulin: 3.2 g/dL (ref 2.0–4.0)
Glucose: 83 mg/dL (ref 65–100)
Potassium: 3.5 mmol/L (ref 3.5–5.1)
Sodium: 138 mmol/L (ref 136–145)
Total Bilirubin: 0.8 MG/DL (ref 0.2–1.0)
Total Protein: 7.4 g/dL (ref 6.4–8.2)

## 2018-03-27 NOTE — Telephone Encounter (Signed)
-----   Message from Vinnie Langton, NP sent at 03/25/2018  9:10 PM EDT -----  Please have patient return for elevated TSH

## 2018-03-28 ENCOUNTER — Inpatient Hospital Stay: Admit: 2018-03-29 | Payer: BLUE CROSS/BLUE SHIELD | Primary: Family Medicine

## 2018-03-28 ENCOUNTER — Ambulatory Visit
Admit: 2018-03-28 | Discharge: 2018-03-28 | Payer: PRIVATE HEALTH INSURANCE | Attending: Family | Primary: Family Medicine

## 2018-03-28 ENCOUNTER — Ambulatory Visit: Attending: Family | Primary: Family

## 2018-03-28 DIAGNOSIS — Z0000 Encounter for general adult medical examination without abnormal findings: Secondary | ICD-10-CM

## 2018-03-28 DIAGNOSIS — Z124 Encounter for screening for malignant neoplasm of cervix: Secondary | ICD-10-CM

## 2018-03-28 MED ORDER — LEVOTHYROXINE 112 MCG TAB
112 mcg | ORAL_TABLET | ORAL | 1 refills | Status: DC
Start: 2018-03-28 — End: 2018-10-29

## 2018-03-28 MED ORDER — HYDROCHLOROTHIAZIDE 12.5 MG TAB
12.5 mg | ORAL_TABLET | Freq: Every day | ORAL | 1 refills | Status: DC
Start: 2018-03-28 — End: 2018-12-10

## 2018-03-28 NOTE — Progress Notes (Signed)
Assessment/Plan:     Diagnoses and all orders for this visit:    1. Annual physical exam    2. Cervical cancer screening  -     PAP IG, APTIMA HPV AND RFX 16/18,45 ([id number]); Future  - Follow up based on results    3. Screening for breast cancer  -     MAM MAMMO BI SCREENING INCL CAD; Future    4. Essential hypertension  -     hydroCHLOROthiazide (HYDRODIURIL) 12.5 mg tablet; Take 1 Tab by mouth daily.  - Stable, labs normal, refill today. Follow up in 6 months    5. Acquired hypothyroidism  -     levothyroxine (SYNTHROID) 112 mcg tablet; TAKE 1 TABLET BY MOUTH ONCE DAILY BEFORE BREAKFAST  - Worsening, elevated TSH, increase levothyroxine to 156mg as directed, will check labs again in 3-4 months.             Discussed expected course/resolution/complications of diagnosis in detail with patient.    Medication risks/benefits/costs/interactions/alternatives discussed with patient.    Pt was given after visit summary which includes diagnoses, current medications & vitals.   Pt expressed understanding with the diagnosis and plan        Subjective:      Jacqueline GHERARDIis a 53y.o. female who presents for had concerns including Complete Physical (With PAP).     Here today for annual CPE.  Sees dentist every 6 months.  Exercises twice a week.  Vision exam is up to date.  Will get flu shot today.  Had colonoscopy last year.  Needs pap smear today.  Denies abnormal pap smears in the past.      Current Outpatient Medications   Medication Sig Dispense Refill   ??? hydroCHLOROthiazide (HYDRODIURIL) 12.5 mg tablet Take 1 Tab by mouth daily. 90 Tab 1   ??? levothyroxine (SYNTHROID) 112 mcg tablet TAKE 1 TABLET BY MOUTH ONCE DAILY BEFORE BREAKFAST 90 Tab 1   ??? fluticasone propionate (FLONASE) 50 mcg/actuation nasal spray 1 Spray by Both Nostrils route daily. 1 Bottle 3   ??? montelukast (SINGULAIR) 10 mg tablet Take 1 Tab by mouth daily for 90 days. 30 Tab 2   ??? loratadine (CLARITIN) 10 mg tablet Take 1 Tab by mouth daily. 90 Tab 0    ??? meclizine (ANTIVERT) 25 mg tablet TK 1 TO 2 TS PO QID PRN FOR DIZZINESS  0   ??? ibuprofen (MOTRIN) 800 mg tablet Take 1 Tab by mouth every eight (8) hours as needed for Pain. 30 Tab 11   ??? clotrimazole-betamethasone (LOTRISONE) topical cream APPLY TO THE AFFECTED AREA TWICE DAILY  1       No Known Allergies  Past Medical History:   Diagnosis Date   ??? H/O seasonal allergies    ??? Hyperlipidemia    ??? Hypertension    ??? Hypothyroid    ??? Impaired glucose tolerance      Past Surgical History:   Procedure Laterality Date   ??? 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 HX TONSIL AND ADENOIDECTOMY     ??? PR EGD DELIVER THERMAL ENERGY SPHNCTR/CARDIA GERD       Family History   Problem Relation Age of Onset   ??? Diabetes Mother      Social History     Socioeconomic History   ??? Marital status: SINGLE     Spouse name: Not on file   ???  Number of children: Not on file   ??? Years of education: Not on file   ??? Highest education level: Not on file   Occupational History   ??? Not on file   Social Needs   ??? Financial resource strain: Not on file   ??? Food insecurity     Worry: Not on file     Inability: Not on file   ??? Transportation needs     Medical: Not on file     Non-medical: Not on file   Tobacco Use   ??? Smoking status: Never Smoker   ??? Smokeless tobacco: Never Used   Substance and Sexual Activity   ??? Alcohol use: No   ??? Drug use: No   ??? Sexual activity: Never   Lifestyle   ??? Physical activity     Days per week: Not on file     Minutes per session: Not on file   ??? Stress: Not on file   Relationships   ??? Social Product manager on phone: Not on file     Gets together: Not on file     Attends religious service: Not on file     Active member of club or organization: Not on file     Attends meetings of clubs or organizations: Not on file     Relationship status: Not on file   ??? Intimate partner violence     Fear of current or ex partner: Not on file     Emotionally abused: Not on file      Physically abused: Not on file     Forced sexual activity: Not on file   Other Topics Concern   ??? Not on file   Social History Narrative   ??? Not on file       HPI      ROS:   Review of Systems   Constitutional: Negative for chills, fever and weight loss.   Eyes: Negative for blurred vision.   Respiratory: Negative for cough and shortness of breath.    Cardiovascular: Negative for chest pain, palpitations and leg swelling.   Gastrointestinal: Negative for constipation, nausea and vomiting.   Genitourinary: Negative for dysuria, frequency and urgency.   Musculoskeletal: Negative for joint pain.   Skin: Negative for rash.   Neurological: Negative for dizziness and headaches.   Psychiatric/Behavioral: Negative for substance abuse and suicidal ideas. The patient does not have insomnia.        Objective:     Visit Vitals  BP 138/87   Pulse 64   Temp 98.4 ??F (36.9 ??C) (Oral)   Resp 16   Ht 5' 7"  (1.702 m)   Wt 169 lb (76.7 kg)   SpO2 100%   BMI 26.47 kg/m??         Vitals and Nurse Documentation reviewed.     Physical Exam  Exam conducted with a chaperone present.   Constitutional:       Appearance: Normal appearance.   HENT:      Head: Normocephalic and atraumatic.      Right Ear: Tympanic membrane normal.      Left Ear: Tympanic membrane normal.      Nose: Nose normal.      Mouth/Throat:      Dentition: Normal dentition.   Eyes:      General:         Right eye: No discharge.         Left eye: No  discharge.      Conjunctiva/sclera:      Right eye: Right conjunctiva is not injected.      Left eye: Left conjunctiva is not injected.      Pupils: Pupils are equal, round, and reactive to light.   Neck:      Musculoskeletal: Neck supple.      Thyroid: No thyroid mass or thyromegaly.      Vascular: No carotid bruit.   Cardiovascular:      Rate and Rhythm: Normal rate and regular rhythm.      Pulses:           Dorsalis pedis pulses are 2+ on the right side and 2+ on the left side.         Posterior tibial pulses are 2+ on the right side and 2+ on the left side.      Heart sounds: S1 normal and S2 normal. No murmur. No friction rub. No gallop.    Pulmonary:      Breath sounds: Normal breath sounds.   Chest:      Breasts:         Right: Normal. No swelling, bleeding, inverted nipple, mass, nipple discharge, skin change or tenderness.         Left: Normal. No swelling, bleeding, inverted nipple, mass, nipple discharge, skin change or tenderness.   Abdominal:      General: Bowel sounds are normal. There is no distension.      Palpations: There is no mass.      Tenderness: There is no abdominal tenderness.   Genitourinary:     Vagina: No vaginal discharge.      Cervix: No cervical motion tenderness.      Uterus: Normal.       Adnexa: Right adnexa normal and left adnexa normal.   Musculoskeletal: Normal range of motion.   Lymphadenopathy:      Cervical: No cervical adenopathy.   Skin:     General: Skin is warm and dry.      Findings: No rash.   Neurological:      Mental Status: She is alert.      Sensory: Sensation is intact.      Gait: Gait is intact. Gait normal.   Psychiatric:         Mood and Affect: Mood and affect normal.         Results for orders placed or performed during the hospital encounter of 03/20/18   LIPID PANEL   Result Value Ref Range    LIPID PROFILE          Cholesterol, total 204 (H) <200 MG/DL    Triglyceride 212 (H) <150 MG/DL    HDL Cholesterol 52 MG/DL    LDL, calculated 109.6 (H) 0 - 100 MG/DL    VLDL, calculated 42.4 MG/DL    CHOL/HDL Ratio 3.9 0.0 - 5.0     TSH 3RD GENERATION   Result Value Ref Range    TSH 4.08 (H) 0.36 - 2.42 uIU/mL   METABOLIC PANEL, COMPREHENSIVE   Result Value Ref Range    Sodium 138 136 - 145 mmol/L    Potassium 3.5 3.5 - 5.1 mmol/L    Chloride 103 97 - 108 mmol/L    CO2 30 21 - 32 mmol/L    Anion gap 5 5 - 15 mmol/L    Glucose 83 65 - 100 mg/dL    BUN 17 6 - 20 MG/DL    Creatinine 0.62 0.55 -  1.02 MG/DL    BUN/Creatinine ratio 27 (H) 12 - 20       GFR est AA >60 >60 ml/min/1.58m    GFR est non-AA >60 >60 ml/min/1.720m   Calcium 9.3 8.5 - 10.1 MG/DL    Bilirubin, total 0.8 0.2 - 1.0 MG/DL    ALT (SGPT) 34 12 - 78 U/L    AST (SGOT) 26 15 - 37 U/L    Alk. phosphatase 98 45 - 117 U/L    Protein, total 7.4 6.4 - 8.2 g/dL    Albumin 4.2 3.5 - 5.0 g/dL    Globulin 3.2 2.0 - 4.0 g/dL    A-G Ratio 1.3 1.1 - 2.2     MICROALBUMIN, UR, RAND W/ MICROALB/CREAT RATIO   Result Value Ref Range    Microalbumin,urine random 0.72 MG/DL    Creatinine, urine 90.10 mg/dL    Microalbumin/Creat ratio (mg/g creat) 8 0 - 30 mg/g   HEMOGLOBIN A1C WITH EAG   Result Value Ref Range    Hemoglobin A1c 5.8 (H) 4.0 - 5.6 %    Est. average glucose 120 mg/dL

## 2018-03-28 NOTE — Progress Notes (Signed)
Patient stated name & dob    Chief Complaint   Patient presents with   ??? Complete Physical     With PAP        Health Maintenance Due   Topic   ??? DTaP/Tdap/Td series (1 - Tdap)   ??? PAP AKA CERVICAL CYTOLOGY    ??? Shingrix Vaccine Age 52> (1 of 2)   ??? FOBT Q1Y Age 52-75    ??? Influenza Age 68 to Adult        Wt Readings from Last 3 Encounters:   03/28/18 169 lb (76.7 kg)   03/20/18 168 lb 3.2 oz (76.3 kg)   11/13/17 176 lb (79.8 kg)     Temp Readings from Last 3 Encounters:   03/28/18 98.4 ??F (36.9 ??C) (Oral)   03/20/18 98.3 ??F (36.8 ??C) (Oral)   11/13/17 98 ??F (36.7 ??C) (Oral)     BP Readings from Last 3 Encounters:   03/28/18 138/87   03/20/18 122/84   11/13/17 119/75     Pulse Readings from Last 3 Encounters:   03/28/18 64   03/20/18 75   11/13/17 77         Learning Assessment:  :     Learning Assessment 01/13/2017   PRIMARY LEARNER Patient   PRIMARY LANGUAGE SPANISH   LEARNER PREFERENCE PRIMARY DEMONSTRATION   ANSWERED BY patient   RELATIONSHIP SELF       Depression Screening:  :     3 most recent PHQ Screens 03/28/2018   Little interest or pleasure in doing things Not at all   Feeling down, depressed, irritable, or hopeless Not at all   Total Score PHQ 2 0   Trouble falling or staying asleep, or sleeping too much -   Feeling tired or having little energy -   Poor appetite, weight loss, or overeating -   Feeling bad about yourself - or that you are a failure or have let yourself or your family down -   Trouble concentrating on things such as school, work, reading, or watching TV -   Moving or speaking so slowly that other people could have noticed; or the opposite being so fidgety that others notice -   Thoughts of being better off dead, or hurting yourself in some way -   PHQ 9 Score -   How difficult have these problems made it for you to do your work, take care of your home and get along with others -       Fall Risk Assessment:  :     No flowsheet data found.    Abuse Screening:  :      Abuse Screening Questionnaire 01/13/2017   Do you ever feel afraid of your partner? N   Are you in a relationship with someone who physically or mentally threatens you? N   Is it safe for you to go home? Y       Coordination of Care Questionnaire:  :     1) Have you been to an emergency room, urgent care clinic since your last visit? No    Hospitalized since your last visit? No             2) Have you seen or consulted any other health care providers outside of [facility] since your last visit? No  (Include any pap smears or colon screenings in this section.)    Patient is accompanied by self I have received verbal consent from [facility]  D Neal to discuss any/all medical information while they are present in the room.

## 2018-03-28 NOTE — Progress Notes (Signed)
Assessment/Plan:     Diagnoses and all orders for this visit:    1. Annual physical exam    2. Cervical cancer screening  -     PAP IG, APTIMA HPV AND RFX 16/18,45 ([id number]); Future  - Follow up based on results    3. Screening for breast cancer  -     MAM MAMMO BI SCREENING INCL CAD; Future    4. Essential hypertension  -     hydroCHLOROthiazide (HYDRODIURIL) 12.5 mg tablet; Take 1 Tab by mouth daily.  - Stable, labs normal, refill today. Follow up in 6 months    5. Acquired hypothyroidism  -     levothyroxine (SYNTHROID) 112 mcg tablet; TAKE 1 TABLET BY MOUTH ONCE DAILY BEFORE BREAKFAST  - Worsening, elevated TSH, increase levothyroxine to 156mg as directed, will check labs again in 3-4 months.             Discussed expected course/resolution/complications of diagnosis in detail with patient.    Medication risks/benefits/costs/interactions/alternatives discussed with patient.    Pt was given after visit summary which includes diagnoses, current medications & vitals.   Pt expressed understanding with the diagnosis and plan        Subjective:      Jacqueline GHERARDIis a 53y.o. female who presents for had concerns including Complete Physical (With PAP).     Here today for annual CPE.  Sees dentist every 6 months.  Exercises twice a week.  Vision exam is up to date.  Will get flu shot today.  Had colonoscopy last year.  Needs pap smear today.  Denies abnormal pap smears in the past.      Current Outpatient Medications   Medication Sig Dispense Refill   ??? hydroCHLOROthiazide (HYDRODIURIL) 12.5 mg tablet Take 1 Tab by mouth daily. 90 Tab 1   ??? levothyroxine (SYNTHROID) 112 mcg tablet TAKE 1 TABLET BY MOUTH ONCE DAILY BEFORE BREAKFAST 90 Tab 1   ??? fluticasone propionate (FLONASE) 50 mcg/actuation nasal spray 1 Spray by Both Nostrils route daily. 1 Bottle 3   ??? montelukast (SINGULAIR) 10 mg tablet Take 1 Tab by mouth daily for 90 days. 30 Tab 2   ??? loratadine (CLARITIN) 10 mg tablet Take 1 Tab by mouth daily. 90 Tab 0    ??? meclizine (ANTIVERT) 25 mg tablet TK 1 TO 2 TS PO QID PRN FOR DIZZINESS  0   ??? ibuprofen (MOTRIN) 800 mg tablet Take 1 Tab by mouth every eight (8) hours as needed for Pain. 30 Tab 11   ??? clotrimazole-betamethasone (LOTRISONE) topical cream APPLY TO THE AFFECTED AREA TWICE DAILY  1       No Known Allergies  Past Medical History:   Diagnosis Date   ??? H/O seasonal allergies    ??? Hyperlipidemia    ??? Hypertension    ??? Hypothyroid    ??? Impaired glucose tolerance      Past Surgical History:   Procedure Laterality Date   ??? 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 HX TONSIL AND ADENOIDECTOMY     ??? PR EGD DELIVER THERMAL ENERGY SPHNCTR/CARDIA GERD       Family History   Problem Relation Age of Onset   ??? Diabetes Mother      Social History     Socioeconomic History   ??? Marital status: SINGLE     Spouse name: Not on file   ???  Number of children: Not on file   ??? Years of education: Not on file   ??? Highest education level: Not on file   Occupational History   ??? Not on file   Social Needs   ??? Financial resource strain: Not on file   ??? Food insecurity     Worry: Not on file     Inability: Not on file   ??? Transportation needs     Medical: Not on file     Non-medical: Not on file   Tobacco Use   ??? Smoking status: Never Smoker   ??? Smokeless tobacco: Never Used   Substance and Sexual Activity   ??? Alcohol use: No   ??? Drug use: No   ??? Sexual activity: Never   Lifestyle   ??? Physical activity     Days per week: Not on file     Minutes per session: Not on file   ??? Stress: Not on file   Relationships   ??? Social Product manager on phone: Not on file     Gets together: Not on file     Attends religious service: Not on file     Active member of club or organization: Not on file     Attends meetings of clubs or organizations: Not on file     Relationship status: Not on file   ??? Intimate partner violence     Fear of current or ex partner: Not on file     Emotionally abused: Not on file     Physically  abused: Not on file     Forced sexual activity: Not on file   Other Topics Concern   ??? Not on file   Social History Narrative   ??? Not on file       HPI      ROS:   Review of Systems   Constitutional: Negative for chills, fever and weight loss.   Eyes: Negative for blurred vision.   Respiratory: Negative for cough and shortness of breath.    Cardiovascular: Negative for chest pain, palpitations and leg swelling.   Gastrointestinal: Negative for constipation, nausea and vomiting.   Genitourinary: Negative for dysuria, frequency and urgency.   Musculoskeletal: Negative for joint pain.   Skin: Negative for rash.   Neurological: Negative for dizziness and headaches.   Psychiatric/Behavioral: Negative for substance abuse and suicidal ideas. The patient does not have insomnia.        Objective:     Visit Vitals  BP 138/87   Pulse 64   Temp 98.4 ??F (36.9 ??C) (Oral)   Resp 16   Ht '5\' 7"'$  (1.702 m)   Wt 169 lb (76.7 kg)   SpO2 100%   BMI 26.47 kg/m??         Vitals and Nurse Documentation reviewed.     Physical Exam  Exam conducted with a chaperone present.   Constitutional:       Appearance: Normal appearance.   HENT:      Head: Normocephalic and atraumatic.      Right Ear: Tympanic membrane normal.      Left Ear: Tympanic membrane normal.      Nose: Nose normal.      Mouth/Throat:      Dentition: Normal dentition.   Eyes:      General:         Right eye: No discharge.         Left eye: No  discharge.      Conjunctiva/sclera:      Right eye: Right conjunctiva is not injected.      Left eye: Left conjunctiva is not injected.      Pupils: Pupils are equal, round, and reactive to light.   Neck:      Musculoskeletal: Neck supple.      Thyroid: No thyroid mass or thyromegaly.      Vascular: No carotid bruit.   Cardiovascular:      Rate and Rhythm: Normal rate and regular rhythm.      Pulses:           Dorsalis pedis pulses are 2+ on the right side and 2+ on the left side.        Posterior tibial pulses are 2+ on the right side and  2+ on the left side.      Heart sounds: S1 normal and S2 normal. No murmur. No friction rub. No gallop.    Pulmonary:      Breath sounds: Normal breath sounds.   Chest:      Breasts:         Right: Normal. No swelling, bleeding, inverted nipple, mass, nipple discharge, skin change or tenderness.         Left: Normal. No swelling, bleeding, inverted nipple, mass, nipple discharge, skin change or tenderness.   Abdominal:      General: Bowel sounds are normal. There is no distension.      Palpations: There is no mass.      Tenderness: There is no abdominal tenderness.   Genitourinary:     Vagina: No vaginal discharge.      Cervix: No cervical motion tenderness.      Uterus: Normal.       Adnexa: Right adnexa normal and left adnexa normal.   Musculoskeletal: Normal range of motion.   Lymphadenopathy:      Cervical: No cervical adenopathy.   Skin:     General: Skin is warm and dry.      Findings: No rash.   Neurological:      Mental Status: She is alert.      Sensory: Sensation is intact.      Gait: Gait is intact. Gait normal.   Psychiatric:         Mood and Affect: Mood and affect normal.         Results for orders placed or performed during the hospital encounter of 03/20/18   LIPID PANEL   Result Value Ref Range    LIPID PROFILE          Cholesterol, total 204 (H) <200 MG/DL    Triglyceride 212 (H) <150 MG/DL    HDL Cholesterol 52 MG/DL    LDL, calculated 109.6 (H) 0 - 100 MG/DL    VLDL, calculated 42.4 MG/DL    CHOL/HDL Ratio 3.9 0.0 - 5.0     TSH 3RD GENERATION   Result Value Ref Range    TSH 4.08 (H) 0.36 - 4.25 uIU/mL   METABOLIC PANEL, COMPREHENSIVE   Result Value Ref Range    Sodium 138 136 - 145 mmol/L    Potassium 3.5 3.5 - 5.1 mmol/L    Chloride 103 97 - 108 mmol/L    CO2 30 21 - 32 mmol/L    Anion gap 5 5 - 15 mmol/L    Glucose 83 65 - 100 mg/dL    BUN 17 6 - 20 MG/DL    Creatinine 0.62 0.55 -  1.02 MG/DL    BUN/Creatinine ratio 27 (H) 12 - 20      GFR est AA >60 >60 ml/min/1.38m    GFR est non-AA >60 >60  ml/min/1.763m   Calcium 9.3 8.5 - 10.1 MG/DL    Bilirubin, total 0.8 0.2 - 1.0 MG/DL    ALT (SGPT) 34 12 - 78 U/L    AST (SGOT) 26 15 - 37 U/L    Alk. phosphatase 98 45 - 117 U/L    Protein, total 7.4 6.4 - 8.2 g/dL    Albumin 4.2 3.5 - 5.0 g/dL    Globulin 3.2 2.0 - 4.0 g/dL    A-G Ratio 1.3 1.1 - 2.2     MICROALBUMIN, UR, RAND W/ MICROALB/CREAT RATIO   Result Value Ref Range    Microalbumin,urine random 0.72 MG/DL    Creatinine, urine 90.10 mg/dL    Microalbumin/Creat ratio (mg/g creat) 8 0 - 30 mg/g   HEMOGLOBIN A1C WITH EAG   Result Value Ref Range    Hemoglobin A1c 5.8 (H) 4.0 - 5.6 %    Est. average glucose 120 mg/dL

## 2018-03-28 NOTE — Progress Notes (Signed)
 Patient stated name & dob    Chief Complaint   Patient presents with   . Complete Physical     With PAP        Health Maintenance Due   Topic   . DTaP/Tdap/Td series (1 - Tdap)   . PAP AKA CERVICAL CYTOLOGY    . Shingrix Vaccine Age 52> (1 of 2)   . FOBT Q1Y Age 101-75    . Influenza Age 27 to Adult        Wt Readings from Last 3 Encounters:   03/28/18 169 lb (76.7 kg)   03/20/18 168 lb 3.2 oz (76.3 kg)   11/13/17 176 lb (79.8 kg)     Temp Readings from Last 3 Encounters:   03/28/18 98.4 F (36.9 C) (Oral)   03/20/18 98.3 F (36.8 C) (Oral)   11/13/17 98 F (36.7 C) (Oral)     BP Readings from Last 3 Encounters:   03/28/18 138/87   03/20/18 122/84   11/13/17 119/75     Pulse Readings from Last 3 Encounters:   03/28/18 64   03/20/18 75   11/13/17 77         Learning Assessment:  :     Learning Assessment 01/13/2017   PRIMARY LEARNER Patient   PRIMARY LANGUAGE SPANISH   LEARNER PREFERENCE PRIMARY DEMONSTRATION   ANSWERED BY patient   RELATIONSHIP SELF       Depression Screening:  :     3 most recent PHQ Screens 03/28/2018   Little interest or pleasure in doing things Not at all   Feeling down, depressed, irritable, or hopeless Not at all   Total Score PHQ 2 0   Trouble falling or staying asleep, or sleeping too much -   Feeling tired or having little energy -   Poor appetite, weight loss, or overeating -   Feeling bad about yourself - or that you are a failure or have let yourself or your family down -   Trouble concentrating on things such as school, work, reading, or watching TV -   Moving or speaking so slowly that other people could have noticed; or the opposite being so fidgety that others notice -   Thoughts of being better off dead, or hurting yourself in some way -   PHQ 9 Score -   How difficult have these problems made it for you to do your work, take care of your home and get along with others -       Fall Risk Assessment:  :     No flowsheet data found.    Abuse Screening:  :     Abuse Screening  Questionnaire 01/13/2017   Do you ever feel afraid of your partner? N   Are you in a relationship with someone who physically or mentally threatens you? N   Is it safe for you to go home? Y       Coordination of Care Questionnaire:  :     1) Have you been to an emergency room, urgent care clinic since your last visit? No    Hospitalized since your last visit? No             2) Have you seen or consulted any other health care providers outside of [facility] since your last visit? No  (Include any pap smears or colon screenings in this section.)    Patient is accompanied by self I have received verbal consent from [facility]  D [place] to discuss any/all medical information while they are present in the room.

## 2018-10-03 ENCOUNTER — Other Ambulatory Visit (HOSPITAL_COMMUNITY): Payer: Self-pay | Admitting: Family Medicine

## 2018-10-03 DIAGNOSIS — Z1231 Encounter for screening mammogram for malignant neoplasm of breast: Secondary | ICD-10-CM

## 2018-10-15 ENCOUNTER — Encounter

## 2018-10-16 MED ORDER — IBUPROFEN 800 MG TAB
800 mg | ORAL_TABLET | ORAL | 0 refills | Status: DC
Start: 2018-10-16 — End: 2018-11-07

## 2018-10-28 ENCOUNTER — Encounter

## 2018-10-29 MED ORDER — LEVOTHYROXINE 112 MCG TAB
112 mcg | ORAL_TABLET | ORAL | 0 refills | Status: DC
Start: 2018-10-29 — End: 2019-01-24

## 2018-11-07 ENCOUNTER — Ambulatory Visit: Admit: 2018-11-07 | Payer: BLUE CROSS/BLUE SHIELD | Attending: Family | Primary: Family Medicine

## 2018-11-07 ENCOUNTER — Ambulatory Visit: Attending: Family | Primary: Family

## 2018-11-07 DIAGNOSIS — R5383 Other fatigue: Secondary | ICD-10-CM

## 2018-11-07 MED ORDER — LORATADINE 10 MG TAB
10 mg | ORAL_TABLET | Freq: Every day | ORAL | 1 refills | Status: DC
Start: 2018-11-07 — End: 2018-12-10

## 2018-11-07 NOTE — Progress Notes (Signed)
Please have patient make virtual apt for low Vitamin D discussion.  All other labs stable.  Copy in the mail

## 2018-11-07 NOTE — Progress Notes (Signed)
Identified pt with two pt identifiers(name and DOB). Reviewed record in preparation for visit and have obtained necessary documentation.  Chief Complaint   Patient presents with   ??? Fatigue   ??? Other     poor appetite        Health Maintenance Due   Topic   ??? DTaP/Tdap/Td series (1 - Tdap)   ??? Shingrix Vaccine Age 52> (1 of 2)   ??? Colorectal Cancer Screening Combo    ??? Flu Vaccine (1)       Visit Vitals  Blood Pressure 115/73 (BP 1 Location: Left arm, BP Patient Position: Sitting)   Pulse 72   Temperature 97.2 ??F (36.2 ??C) (Oral)   Respiration 16   Height 5' 7" (1.702 m)   Weight 170 lb 9.6 oz (77.4 kg)   Oxygen Saturation 98%   Body Mass Index 26.72 kg/m??         Coordination of Care Questionnaire:  :   1) Have you been to an emergency room, urgent care, or hospitalized since your last visit?  NO      2. Have seen or consulted any other health care provider since your last visit?   NO          Patient is accompanied by  I have received verbal consent from Jacqueline Mckinney to discuss any/all medical information while they are present in the room.

## 2018-11-07 NOTE — Progress Notes (Signed)
Assessment/Plan:     Diagnoses and all orders for this visit:    1. Fatigue, unspecified type  -     HEMOGLOBIN A1C WITH EAG; Future  -     CBC WITH AUTOMATED DIFF; Future  -     TSH 3RD GENERATION; Future  -     METABOLIC PANEL, COMPREHENSIVE; Future  -     VITAMIN D, 25 HYDROXY; Future  - Unchanged, labs today, follow up based on labs.  If normal will consider sleep study    2. History of seasonal allergies  -     loratadine (Claritin) 10 mg tablet; Take 1 Tab by mouth daily.  -     REFERRAL TO ALLERGY  - Worsening, refill today, referral to allergy            Discussed expected course/resolution/complications of diagnosis in detail with patient.    Medication risks/benefits/costs/interactions/alternatives discussed with patient.    Pt was given after visit summary which includes diagnoses, current medications & vitals.   Pt expressed understanding with the diagnosis and plan        Subjective:      Jacqueline Mckinney is a 53 y.o. female who presents for had concerns including Fatigue and Other (poor appetite).     Fatigue  Patient complains of fatigue. Symptoms began several weeks ago. Sentinal symptom the patient feels fatigue began with: none. Symptoms of her fatigue have been general malaise, change in appetite. Patient describes the following psychologic symptoms: stress in the family: mild.  Patient denies none. The course has been symptoms have progressed to a point and plateaued.. Severity has been symptoms bothersome, but easily able to carry out all usual work/school/family. Previous visits for this problem: none.  Denies snoring    Complains of itchy throat.  Has a history of allergies.  Stopped taking Claritin and flonase because insurance would not pay for it.  Has this every day.     Current Outpatient Medications   Medication Sig Dispense Refill   ??? loratadine (Claritin) 10 mg tablet Take 1 Tab by mouth daily. 90 Tab 1   ??? levothyroxine (SYNTHROID) 112 mcg tablet TAKE 1 TABLET BY MOUTH ONCE  DAILY BEFORE BREAKFAST 90 Tab 0   ??? hydroCHLOROthiazide (HYDRODIURIL) 12.5 mg tablet Take 1 Tab by mouth daily. 90 Tab 1   ??? meclizine (ANTIVERT) 25 mg tablet TK 1 TO 2 TS PO QID PRN FOR DIZZINESS  0   ??? clotrimazole-betamethasone (LOTRISONE) topical cream APPLY TO THE AFFECTED AREA TWICE DAILY  1       No Known Allergies  Past Medical History:   Diagnosis Date   ??? H/O seasonal allergies    ??? Hyperlipidemia    ??? Hypertension    ??? Hypothyroid    ??? Impaired glucose tolerance      Past Surgical History:   Procedure Laterality Date   ??? 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 HX TONSIL AND ADENOIDECTOMY     ??? PR EGD DELIVER THERMAL ENERGY SPHNCTR/CARDIA GERD       Family History   Problem Relation Age of Onset   ??? Diabetes Mother      Social History     Socioeconomic History   ??? Marital status: SINGLE     Spouse name: Not on file   ??? Number of children: Not on file   ??? Years of education: Not on file   ???  Highest education level: Not on file   Occupational History   ??? Not on file   Social Needs   ??? Financial resource strain: Not on file   ??? Food insecurity     Worry: Not on file     Inability: Not on file   ??? Transportation needs     Medical: Not on file     Non-medical: Not on file   Tobacco Use   ??? Smoking status: Never Smoker   ??? Smokeless tobacco: Never Used   Substance and Sexual Activity   ??? Alcohol use: No   ??? Drug use: No   ??? Sexual activity: Never   Lifestyle   ??? Physical activity     Days per week: Not on file     Minutes per session: Not on file   ??? Stress: Not on file   Relationships   ??? Social Product manager on phone: Not on file     Gets together: Not on file     Attends religious service: Not on file     Active member of club or organization: Not on file     Attends meetings of clubs or organizations: Not on file     Relationship status: Not on file   ??? Intimate partner violence     Fear of current or ex partner: Not on file     Emotionally abused: Not on file      Physically abused: Not on file     Forced sexual activity: Not on file   Other Topics Concern   ??? Not on file   Social History Narrative   ??? Not on file       HPI      ROS:   Review of Systems   Constitutional: Positive for malaise/fatigue. Negative for chills and fever.   HENT: Negative for congestion.    Eyes: Negative for blurred vision.   Respiratory: Negative for cough and shortness of breath.    Cardiovascular: Negative for chest pain, palpitations and leg swelling.   Gastrointestinal: Negative for abdominal pain.   Genitourinary: Negative for dysuria and urgency.   Musculoskeletal: Negative for joint pain.   Neurological: Negative for dizziness and headaches.   Endo/Heme/Allergies: Negative for polydipsia.   Psychiatric/Behavioral: Negative for depression.       Objective:     Visit Vitals  BP 115/73 (BP 1 Location: Left arm, BP Patient Position: Sitting)   Pulse 72   Temp 97.2 ??F (36.2 ??C) (Oral)   Resp 16   Ht 5' 7"  (1.702 m)   Wt 170 lb 9.6 oz (77.4 kg)   SpO2 98%   BMI 26.72 kg/m??         Vitals and Nurse Documentation reviewed.     Physical Exam  Constitutional:       General: She is not in acute distress.     Appearance: She is not diaphoretic.   HENT:      Head: Normocephalic and atraumatic.   Cardiovascular:      Rate and Rhythm: Normal rate and regular rhythm.      Heart sounds: Normal heart sounds. No murmur. No friction rub. No gallop.    Pulmonary:      Effort: Pulmonary effort is normal. No respiratory distress.      Breath sounds: Normal breath sounds. No wheezing or rales.   Lymphadenopathy:      Head:      Right side of head:  No submandibular, tonsillar or preauricular adenopathy.      Left side of head: No submandibular, tonsillar or preauricular adenopathy.   Skin:     General: Skin is warm and dry.      Coloration: Skin is not pale.   Neurological:      Mental Status: She is alert and oriented to person, place, and time.   Psychiatric:          Mood and Affect: Affect normal. Mood is not anxious or depressed.         Behavior: Behavior is not agitated.         Thought Content: Thought content does not include homicidal or suicidal ideation.         Results for orders placed or performed in visit on 11/07/18   VITAMIN D, 25 HYDROXY   Result Value Ref Range    Vitamin D 25-Hydroxy 18.7 (L) 30 - 469 ng/mL   METABOLIC PANEL, COMPREHENSIVE   Result Value Ref Range    Sodium 137 136 - 145 mmol/L    Potassium 3.8 3.5 - 5.1 mmol/L    Chloride 102 97 - 108 mmol/L    CO2 31 21 - 32 mmol/L    Anion gap 4 (L) 5 - 15 mmol/L    Glucose 95 65 - 100 mg/dL    BUN 19 6 - 20 MG/DL    Creatinine 0.63 0.55 - 1.02 MG/DL    BUN/Creatinine ratio 30 (H) 12 - 20      GFR est AA >60 >60 ml/min/1.16m    GFR est non-AA >60 >60 ml/min/1.716m   Calcium 9.5 8.5 - 10.1 MG/DL    Bilirubin, total 0.8 0.2 - 1.0 MG/DL    ALT (SGPT) 33 12 - 78 U/L    AST (SGOT) 31 15 - 37 U/L    Alk. phosphatase 102 45 - 117 U/L    Protein, total 7.3 6.4 - 8.2 g/dL    Albumin 4.1 3.5 - 5.0 g/dL    Globulin 3.2 2.0 - 4.0 g/dL    A-G Ratio 1.3 1.1 - 2.2     TSH 3RD GENERATION   Result Value Ref Range    TSH 2.82 0.36 - 3.74 uIU/mL   CBC WITH AUTOMATED DIFF   Result Value Ref Range    WBC 4.0 3.6 - 11.0 K/uL    RBC 4.66 3.80 - 5.20 M/uL    HGB 11.7 11.5 - 16.0 g/dL    HCT 38.6 35.0 - 47.0 %    MCV 82.8 80.0 - 99.0 FL    MCH 25.1 (L) 26.0 - 34.0 PG    MCHC 30.3 30.0 - 36.5 g/dL    RDW 12.8 11.5 - 14.5 %    PLATELET 211 150 - 400 K/uL    MPV 12.3 8.9 - 12.9 FL    NRBC 0.0 0 PER 100 WBC    ABSOLUTE NRBC 0.00 0.00 - 0.01 K/uL    NEUTROPHILS 55 32 - 75 %    LYMPHOCYTES 29 12 - 49 %    MONOCYTES 8 5 - 13 %    EOSINOPHILS 6 0 - 7 %    BASOPHILS 2 (H) 0 - 1 %    IMMATURE GRANULOCYTES 0 0.0 - 0.5 %    ABS. NEUTROPHILS 2.2 1.8 - 8.0 K/UL    ABS. LYMPHOCYTES 1.2 0.8 - 3.5 K/UL    ABS. MONOCYTES 0.3 0.0 - 1.0 K/UL    ABS. EOSINOPHILS 0.2 0.0 -  0.4 K/UL    ABS. BASOPHILS 0.1 0.0 - 0.1 K/UL     ABS. IMM. GRANS. 0.0 0.00 - 0.04 K/UL    DF AUTOMATED     HEMOGLOBIN A1C WITH EAG   Result Value Ref Range    Hemoglobin A1c 5.7 (H) 4.0 - 5.6 %    Est. average glucose 117 mg/dL

## 2018-11-07 NOTE — Progress Notes (Signed)
Please have patient make virtual apt for low Vitamin D discussion.  All other labs stable.  Copy in the mail

## 2018-11-07 NOTE — Progress Notes (Signed)
 Identified pt with two pt identifiers(name and DOB). Reviewed record in preparation for visit and have obtained necessary documentation.  Chief Complaint   Patient presents with   . Fatigue   . Other     poor appetite        Health Maintenance Due   Topic   . DTaP/Tdap/Td series (1 - Tdap)   . Shingrix Vaccine Age 52> (1 of 2)   . Colorectal Cancer Screening Combo    . Flu Vaccine (1)       Visit Vitals  Blood Pressure 115/73 (BP 1 Location: Left arm, BP Patient Position: Sitting)   Pulse 72   Temperature 97.2 F (36.2 C) (Oral)   Respiration 16   Height 5' 7 (1.702 m)   Weight 170 lb 9.6 oz (77.4 kg)   Oxygen Saturation 98%   Body Mass Index 26.72 kg/m         Coordination of Care Questionnaire:  :   1) Have you been to an emergency room, urgent care, or hospitalized since your last visit?  NO      2. Have seen or consulted any other health care provider since your last visit?   NO          Patient is accompanied by  I have received verbal consent from Hadassah JONETTA Stallion to discuss any/all medical information while they are present in the room.

## 2018-11-07 NOTE — Progress Notes (Signed)
Assessment/Plan:     Diagnoses and all orders for this visit:    1. Fatigue, unspecified type  -     HEMOGLOBIN A1C WITH EAG; Future  -     CBC WITH AUTOMATED DIFF; Future  -     TSH 3RD GENERATION; Future  -     METABOLIC PANEL, COMPREHENSIVE; Future  -     VITAMIN D, 25 HYDROXY; Future  - Unchanged, labs today, follow up based on labs.  If normal will consider sleep study    2. History of seasonal allergies  -     loratadine (Claritin) 10 mg tablet; Take 1 Tab by mouth daily.  -     REFERRAL TO ALLERGY  - Worsening, refill today, referral to allergy            Discussed expected course/resolution/complications of diagnosis in detail with patient.    Medication risks/benefits/costs/interactions/alternatives discussed with patient.    Pt was given after visit summary which includes diagnoses, current medications & vitals.   Pt expressed understanding with the diagnosis and plan        Subjective:      Jacqueline Mckinney is a 53 y.o. female who presents for had concerns including Fatigue and Other (poor appetite).     Fatigue  Patient complains of fatigue. Symptoms began several weeks ago. Sentinal symptom the patient feels fatigue began with: none. Symptoms of her fatigue have been general malaise, change in appetite. Patient describes the following psychologic symptoms: stress in the family: mild.  Patient denies none. The course has been symptoms have progressed to a point and plateaued.. Severity has been symptoms bothersome, but easily able to carry out all usual work/school/family. Previous visits for this problem: none.  Denies snoring    Complains of itchy throat.  Has a history of allergies.  Stopped taking Claritin and flonase because insurance would not pay for it.  Has this every day.     Current Outpatient Medications   Medication Sig Dispense Refill   ??? loratadine (Claritin) 10 mg tablet Take 1 Tab by mouth daily. 90 Tab 1   ??? levothyroxine (SYNTHROID) 112 mcg tablet TAKE 1 TABLET BY MOUTH ONCE DAILY  BEFORE BREAKFAST 90 Tab 0   ??? hydroCHLOROthiazide (HYDRODIURIL) 12.5 mg tablet Take 1 Tab by mouth daily. 90 Tab 1   ??? meclizine (ANTIVERT) 25 mg tablet TK 1 TO 2 TS PO QID PRN FOR DIZZINESS  0   ??? clotrimazole-betamethasone (LOTRISONE) topical cream APPLY TO THE AFFECTED AREA TWICE DAILY  1       No Known Allergies  Past Medical History:   Diagnosis Date   ??? H/O seasonal allergies    ??? Hyperlipidemia    ??? Hypertension    ??? Hypothyroid    ??? Impaired glucose tolerance      Past Surgical History:   Procedure Laterality Date   ??? 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 HX TONSIL AND ADENOIDECTOMY     ??? PR EGD DELIVER THERMAL ENERGY SPHNCTR/CARDIA GERD       Family History   Problem Relation Age of Onset   ??? Diabetes Mother      Social History     Socioeconomic History   ??? Marital status: SINGLE     Spouse name: Not on file   ??? Number of children: Not on file   ??? Years of education: Not on file   ???  Highest education level: Not on file   Occupational History   ??? Not on file   Social Needs   ??? Financial resource strain: Not on file   ??? Food insecurity     Worry: Not on file     Inability: Not on file   ??? Transportation needs     Medical: Not on file     Non-medical: Not on file   Tobacco Use   ??? Smoking status: Never Smoker   ??? Smokeless tobacco: Never Used   Substance and Sexual Activity   ??? Alcohol use: No   ??? Drug use: No   ??? Sexual activity: Never   Lifestyle   ??? Physical activity     Days per week: Not on file     Minutes per session: Not on file   ??? Stress: Not on file   Relationships   ??? Social Product manager on phone: Not on file     Gets together: Not on file     Attends religious service: Not on file     Active member of club or organization: Not on file     Attends meetings of clubs or organizations: Not on file     Relationship status: Not on file   ??? Intimate partner violence     Fear of current or ex partner: Not on file     Emotionally abused: Not on file      Physically abused: Not on file     Forced sexual activity: Not on file   Other Topics Concern   ??? Not on file   Social History Narrative   ??? Not on file       HPI      ROS:   Review of Systems   Constitutional: Positive for malaise/fatigue. Negative for chills and fever.   HENT: Negative for congestion.    Eyes: Negative for blurred vision.   Respiratory: Negative for cough and shortness of breath.    Cardiovascular: Negative for chest pain, palpitations and leg swelling.   Gastrointestinal: Negative for abdominal pain.   Genitourinary: Negative for dysuria and urgency.   Musculoskeletal: Negative for joint pain.   Neurological: Negative for dizziness and headaches.   Endo/Heme/Allergies: Negative for polydipsia.   Psychiatric/Behavioral: Negative for depression.       Objective:     Visit Vitals  BP 115/73 (BP 1 Location: Left arm, BP Patient Position: Sitting)   Pulse 72   Temp 97.2 ??F (36.2 ??C) (Oral)   Resp 16   Ht 5' 7"  (1.702 m)   Wt 170 lb 9.6 oz (77.4 kg)   SpO2 98%   BMI 26.72 kg/m??         Vitals and Nurse Documentation reviewed.     Physical Exam  Constitutional:       General: She is not in acute distress.     Appearance: She is not diaphoretic.   HENT:      Head: Normocephalic and atraumatic.   Cardiovascular:      Rate and Rhythm: Normal rate and regular rhythm.      Heart sounds: Normal heart sounds. No murmur. No friction rub. No gallop.    Pulmonary:      Effort: Pulmonary effort is normal. No respiratory distress.      Breath sounds: Normal breath sounds. No wheezing or rales.   Lymphadenopathy:      Head:      Right side of head:  No submandibular, tonsillar or preauricular adenopathy.      Left side of head: No submandibular, tonsillar or preauricular adenopathy.   Skin:     General: Skin is warm and dry.      Coloration: Skin is not pale.   Neurological:      Mental Status: She is alert and oriented to person, place, and time.   Psychiatric:         Mood and Affect: Affect normal. Mood is not  anxious or depressed.         Behavior: Behavior is not agitated.         Thought Content: Thought content does not include homicidal or suicidal ideation.         Results for orders placed or performed in visit on 11/07/18   VITAMIN D, 25 HYDROXY   Result Value Ref Range    Vitamin D 25-Hydroxy 18.7 (L) 30 - 284 ng/mL   METABOLIC PANEL, COMPREHENSIVE   Result Value Ref Range    Sodium 137 136 - 145 mmol/L    Potassium 3.8 3.5 - 5.1 mmol/L    Chloride 102 97 - 108 mmol/L    CO2 31 21 - 32 mmol/L    Anion gap 4 (L) 5 - 15 mmol/L    Glucose 95 65 - 100 mg/dL    BUN 19 6 - 20 MG/DL    Creatinine 0.63 0.55 - 1.02 MG/DL    BUN/Creatinine ratio 30 (H) 12 - 20      GFR est AA >60 >60 ml/min/1.9m    GFR est non-AA >60 >60 ml/min/1.725m   Calcium 9.5 8.5 - 10.1 MG/DL    Bilirubin, total 0.8 0.2 - 1.0 MG/DL    ALT (SGPT) 33 12 - 78 U/L    AST (SGOT) 31 15 - 37 U/L    Alk. phosphatase 102 45 - 117 U/L    Protein, total 7.3 6.4 - 8.2 g/dL    Albumin 4.1 3.5 - 5.0 g/dL    Globulin 3.2 2.0 - 4.0 g/dL    A-G Ratio 1.3 1.1 - 2.2     TSH 3RD GENERATION   Result Value Ref Range    TSH 2.82 0.36 - 3.74 uIU/mL   CBC WITH AUTOMATED DIFF   Result Value Ref Range    WBC 4.0 3.6 - 11.0 K/uL    RBC 4.66 3.80 - 5.20 M/uL    HGB 11.7 11.5 - 16.0 g/dL    HCT 38.6 35.0 - 47.0 %    MCV 82.8 80.0 - 99.0 FL    MCH 25.1 (L) 26.0 - 34.0 PG    MCHC 30.3 30.0 - 36.5 g/dL    RDW 12.8 11.5 - 14.5 %    PLATELET 211 150 - 400 K/uL    MPV 12.3 8.9 - 12.9 FL    NRBC 0.0 0 PER 100 WBC    ABSOLUTE NRBC 0.00 0.00 - 0.01 K/uL    NEUTROPHILS 55 32 - 75 %    LYMPHOCYTES 29 12 - 49 %    MONOCYTES 8 5 - 13 %    EOSINOPHILS 6 0 - 7 %    BASOPHILS 2 (H) 0 - 1 %    IMMATURE GRANULOCYTES 0 0.0 - 0.5 %    ABS. NEUTROPHILS 2.2 1.8 - 8.0 K/UL    ABS. LYMPHOCYTES 1.2 0.8 - 3.5 K/UL    ABS. MONOCYTES 0.3 0.0 - 1.0 K/UL    ABS. EOSINOPHILS 0.2 0.0 -  0.4 K/UL    ABS. BASOPHILS 0.1 0.0 - 0.1 K/UL    ABS. IMM. GRANS. 0.0 0.00 - 0.04 K/UL    DF AUTOMATED     HEMOGLOBIN A1C  WITH EAG   Result Value Ref Range    Hemoglobin A1c 5.7 (H) 4.0 - 5.6 %    Est. average glucose 117 mg/dL

## 2018-11-08 LAB — METABOLIC PANEL, COMPREHENSIVE
A-G Ratio: 1.3 (ref 1.1–2.2)
ALT (SGPT): 33 U/L (ref 12–78)
AST (SGOT): 31 U/L (ref 15–37)
Albumin: 4.1 g/dL (ref 3.5–5.0)
Alk. phosphatase: 102 U/L (ref 45–117)
Anion gap: 4 mmol/L — ABNORMAL LOW (ref 5–15)
BUN/Creatinine ratio: 30 — ABNORMAL HIGH (ref 12–20)
BUN: 19 MG/DL (ref 6–20)
Bilirubin, total: 0.8 MG/DL (ref 0.2–1.0)
CO2: 31 mmol/L (ref 21–32)
Calcium: 9.5 MG/DL (ref 8.5–10.1)
Chloride: 102 mmol/L (ref 97–108)
Creatinine: 0.63 MG/DL (ref 0.55–1.02)
GFR est AA: >60 mL/min/{1.73_m2} (ref >60)
GFR est non-AA: >60 mL/min/{1.73_m2} (ref >60)
Globulin: 3.2 g/dL (ref 2.0–4.0)
Glucose: 95 mg/dL (ref 65–100)
Potassium: 3.8 mmol/L (ref 3.5–5.1)
Protein, total: 7.3 g/dL (ref 6.4–8.2)
Sodium: 137 mmol/L (ref 136–145)

## 2018-11-08 LAB — CBC WITH AUTOMATED DIFF
ABS. BASOPHILS: 0.1 10*3/uL (ref 0.0–0.1)
ABS. EOSINOPHILS: 0.2 10*3/uL (ref 0.0–0.4)
ABS. IMM. GRANS.: 0 10*3/uL (ref 0.00–0.04)
ABS. LYMPHOCYTES: 1.2 10*3/uL (ref 0.8–3.5)
ABS. MONOCYTES: 0.3 10*3/uL (ref 0.0–1.0)
ABS. NEUTROPHILS: 2.2 10*3/uL (ref 1.8–8.0)
ABSOLUTE NRBC: 0 10*3/uL (ref 0.00–0.01)
BASOPHILS: 2 % — ABNORMAL HIGH (ref 0–1)
EOSINOPHILS: 6 % (ref 0–7)
HCT: 38.6 % (ref 35.0–47.0)
HGB: 11.7 g/dL (ref 11.5–16.0)
IMMATURE GRANULOCYTES: 0 % (ref 0.0–0.5)
LYMPHOCYTES: 29 % (ref 12–49)
MCH: 25.1 PG — ABNORMAL LOW (ref 26.0–34.0)
MCHC: 30.3 g/dL (ref 30.0–36.5)
MCV: 82.8 FL (ref 80.0–99.0)
MONOCYTES: 8 % (ref 5–13)
MPV: 12.3 FL (ref 8.9–12.9)
NEUTROPHILS: 55 % (ref 32–75)
NRBC: 0 PER 100 WBC
PLATELET: 211 10*3/uL (ref 150–400)
RBC: 4.66 M/uL (ref 3.80–5.20)
RDW: 12.8 % (ref 11.5–14.5)
WBC: 4 10*3/uL (ref 3.6–11.0)

## 2018-11-08 LAB — VITAMIN D, 25 HYDROXY: Vitamin D 25-Hydroxy: 18.7 ng/mL — ABNORMAL LOW (ref 30–100)

## 2018-11-08 LAB — HEMOGLOBIN A1C WITH EAG
Est. average glucose: 117 mg/dL
Hemoglobin A1c: 5.7 % — ABNORMAL HIGH (ref 4.0–5.6)

## 2018-11-08 LAB — TSH 3RD GENERATION
TSH: 2.82 u[IU]/mL (ref 0.36–3.74)
TSH: 2.82 u[IU]/mL (ref 0.36–3.74)

## 2018-11-08 LAB — COMPREHENSIVE METABOLIC PANEL
ALT: 33 U/L (ref 12–78)
AST: 31 U/L (ref 15–37)
Albumin/Globulin Ratio: 1.3 (ref 1.1–2.2)
Albumin: 4.1 g/dL (ref 3.5–5.0)
Alkaline Phosphatase: 102 U/L (ref 45–117)
Anion Gap: 4 mmol/L — ABNORMAL LOW (ref 5–15)
BUN: 19 MG/DL (ref 6–20)
Bun/Cre Ratio: 30 — ABNORMAL HIGH (ref 12–20)
CO2: 31 mmol/L (ref 21–32)
Calcium: 9.5 MG/DL (ref 8.5–10.1)
Chloride: 102 mmol/L (ref 97–108)
Creatinine: 0.63 MG/DL (ref 0.55–1.02)
EGFR IF NonAfrican American: >60 mL/min/{1.73_m2} (ref >60)
GFR African American: >60 mL/min/{1.73_m2} (ref >60)
Globulin: 3.2 g/dL (ref 2.0–4.0)
Glucose: 95 mg/dL (ref 65–100)
Potassium: 3.8 mmol/L (ref 3.5–5.1)
Sodium: 137 mmol/L (ref 136–145)
Total Bilirubin: 0.8 MG/DL (ref 0.2–1.0)
Total Protein: 7.3 g/dL (ref 6.4–8.2)

## 2018-11-08 LAB — CBC WITH AUTO DIFFERENTIAL
Basophils %: 2 % — ABNORMAL HIGH (ref 0–1)
Basophils Absolute: 0.1 10*3/uL (ref 0.0–0.1)
Eosinophils %: 6 % (ref 0–7)
Eosinophils Absolute: 0.2 10*3/uL (ref 0.0–0.4)
Granulocyte Absolute Count: 0 10*3/uL (ref 0.00–0.04)
Hematocrit: 38.6 % (ref 35.0–47.0)
Hemoglobin: 11.7 g/dL (ref 11.5–16.0)
Immature Granulocytes: 0 % (ref 0.0–0.5)
Lymphocytes %: 29 % (ref 12–49)
Lymphocytes Absolute: 1.2 10*3/uL (ref 0.8–3.5)
MCH: 25.1 PG — ABNORMAL LOW (ref 26.0–34.0)
MCHC: 30.3 g/dL (ref 30.0–36.5)
MCV: 82.8 FL (ref 80.0–99.0)
MPV: 12.3 FL (ref 8.9–12.9)
Monocytes %: 8 % (ref 5–13)
Monocytes Absolute: 0.3 10*3/uL (ref 0.0–1.0)
NRBC Absolute: 0 10*3/uL (ref 0.00–0.01)
Neutrophils %: 55 % (ref 32–75)
Neutrophils Absolute: 2.2 10*3/uL (ref 1.8–8.0)
Nucleated RBCs: 0 PER 100 WBC
Platelets: 211 10*3/uL (ref 150–400)
RBC: 4.66 M/uL (ref 3.80–5.20)
RDW: 12.8 % (ref 11.5–14.5)
WBC: 4 10*3/uL (ref 3.6–11.0)

## 2018-11-08 LAB — HEMOGLOBIN A1C W/EAG
Hemoglobin A1C: 5.7 % — ABNORMAL HIGH (ref 4.0–5.6)
eAG: 117 mg/dL

## 2018-11-08 LAB — VITAMIN D 25 HYDROXY: Vit D, 25-Hydroxy: 18.7 ng/mL — ABNORMAL LOW (ref 30–100)

## 2018-11-12 ENCOUNTER — Ambulatory Visit: Admit: 2018-11-12 | Payer: BLUE CROSS/BLUE SHIELD | Attending: Family | Primary: Family Medicine

## 2018-11-12 ENCOUNTER — Ambulatory Visit: Attending: Family | Primary: Family

## 2018-11-12 DIAGNOSIS — E559 Vitamin D deficiency, unspecified: Secondary | ICD-10-CM

## 2018-11-12 MED ORDER — CHOLECALCIFEROL (VITAMIN D3) 50,000 UNIT CAPSULE
ORAL_CAPSULE | ORAL | 0 refills | Status: DC
Start: 2018-11-12 — End: 2020-07-10

## 2018-11-12 MED ORDER — CHOLECALCIFEROL (VITAMIN D3) 1,000 UNIT (25 MCG) TAB
ORAL_TABLET | Freq: Every day | ORAL | 1 refills | Status: AC
Start: 2018-11-12 — End: ?

## 2018-11-12 NOTE — Progress Notes (Signed)
Assessment/Plan:     Diagnoses and all orders for this visit:    1. Vitamin D deficiency  -     cholecalciferol (VITAMIN D3) (1000 Units /25 mcg) tablet; Take 2 Tabs by mouth daily.  -     cholecalciferol (VITAMIN D3) (50,000 UNITS /1250 MCG) capsule; Take 1 Cap by mouth every seven (7) days.  -     VITAMIN D, 25 HYDROXY; Future  - Worsening, will replete with 50,000 unit tablet once a week for 8 weeks as discussed then a maintenance dose of 2000 units daily.  Labs in 3-4 months.      2. Elevated BUN  -     METABOLIC PANEL, BASIC; Future  - Please hydrate more            Discussed expected course/resolution/complications of diagnosis in detail with patient.    Medication risks/benefits/costs/interactions/alternatives discussed with patient.    Pt was given after visit summary which includes diagnoses, current medications & vitals.   Pt expressed understanding with the diagnosis and plan        Subjective:      Jacqueline Mckinney is a 53 y.o. female who presents for had concerns including Results.     Here today for follow up on low vitamin D.  All other labs normal.  Will repleat   Discussed repletion schedule with 50,000 unit tablets once a week for 8 weeks and then 2000 unit tablets for maintenance dose.     Current Outpatient Medications   Medication Sig Dispense Refill   ??? cholecalciferol (VITAMIN D3) (1000 Units /25 mcg) tablet Take 2 Tabs by mouth daily. 180 Tab 1   ??? cholecalciferol (VITAMIN D3) (50,000 UNITS /1250 MCG) capsule Take 1 Cap by mouth every seven (7) days. 8 Cap 0   ??? loratadine (Claritin) 10 mg tablet Take 1 Tab by mouth daily. 90 Tab 1   ??? levothyroxine (SYNTHROID) 112 mcg tablet TAKE 1 TABLET BY MOUTH ONCE DAILY BEFORE BREAKFAST 90 Tab 0   ??? hydroCHLOROthiazide (HYDRODIURIL) 12.5 mg tablet Take 1 Tab by mouth daily. 90 Tab 1       No Known Allergies  Past Medical History:   Diagnosis Date   ??? H/O seasonal allergies    ??? Hyperlipidemia    ??? Hypertension    ??? Hypothyroid     ??? Impaired glucose tolerance      Past Surgical History:   Procedure Laterality Date   ??? 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 HX TONSIL AND ADENOIDECTOMY     ??? PR EGD DELIVER THERMAL ENERGY SPHNCTR/CARDIA GERD       Family History   Problem Relation Age of Onset   ??? Diabetes Mother      Social History     Socioeconomic History   ??? Marital status: SINGLE     Spouse name: Not on file   ??? Number of children: Not on file   ??? Years of education: Not on file   ??? Highest education level: Not on file   Occupational History   ??? Not on file   Social Needs   ??? Financial resource strain: Not on file   ??? Food insecurity     Worry: Not on file     Inability: Not on file   ??? Transportation needs     Medical: Not on file     Non-medical: Not on file  Tobacco Use   ??? Smoking status: Never Smoker   ??? Smokeless tobacco: Never Used   Substance and Sexual Activity   ??? Alcohol use: No   ??? Drug use: No   ??? Sexual activity: Never   Lifestyle   ??? Physical activity     Days per week: Not on file     Minutes per session: Not on file   ??? Stress: Not on file   Relationships   ??? Social Product manager on phone: Not on file     Gets together: Not on file     Attends religious service: Not on file     Active member of club or organization: Not on file     Attends meetings of clubs or organizations: Not on file     Relationship status: Not on file   ??? Intimate partner violence     Fear of current or ex partner: Not on file     Emotionally abused: Not on file     Physically abused: Not on file     Forced sexual activity: Not on file   Other Topics Concern   ??? Not on file   Social History Narrative   ??? Not on file       HPI      ROS:   Review of Systems   Constitutional: Negative for chills, fever and malaise/fatigue.   Eyes: Negative for blurred vision.   Respiratory: Negative for cough and shortness of breath.    Cardiovascular: Negative for chest pain, palpitations and leg swelling.    Neurological: Negative for dizziness and headaches.       Objective:     Visit Vitals  BP 128/80 (BP 1 Location: Left arm, BP Patient Position: Sitting)   Pulse 86   Temp 98 ??F (36.7 ??C) (Oral)   Resp 16   Ht 5' 7"  (1.702 m)   Wt 171 lb (77.6 kg)   SpO2 100%   BMI 26.78 kg/m??         Vitals and Nurse Documentation reviewed.     Physical Exam  Constitutional:       General: She is not in acute distress.     Appearance: She is not diaphoretic.   HENT:      Head: Normocephalic and atraumatic.   Cardiovascular:      Rate and Rhythm: Normal rate and regular rhythm.      Heart sounds: Normal heart sounds. No murmur. No friction rub. No gallop.    Pulmonary:      Effort: Pulmonary effort is normal. No respiratory distress.      Breath sounds: Normal breath sounds. No wheezing or rales.   Skin:     General: Skin is warm and dry.      Coloration: Skin is not pale.   Neurological:      Mental Status: She is alert and oriented to person, place, and time.   Psychiatric:         Mood and Affect: Affect normal. Mood is not anxious or depressed.         Behavior: Behavior is not agitated.         Thought Content: Thought content does not include homicidal or suicidal ideation.         Results for orders placed or performed in visit on 11/07/18   VITAMIN D, 25 HYDROXY   Result Value Ref Range    Vitamin D 25-Hydroxy 18.7 (L) 30 -  500 ng/mL   METABOLIC PANEL, COMPREHENSIVE   Result Value Ref Range    Sodium 137 136 - 145 mmol/L    Potassium 3.8 3.5 - 5.1 mmol/L    Chloride 102 97 - 108 mmol/L    CO2 31 21 - 32 mmol/L    Anion gap 4 (L) 5 - 15 mmol/L    Glucose 95 65 - 100 mg/dL    BUN 19 6 - 20 MG/DL    Creatinine 0.63 0.55 - 1.02 MG/DL    BUN/Creatinine ratio 30 (H) 12 - 20      GFR est AA >60 >60 ml/min/1.47m    GFR est non-AA >60 >60 ml/min/1.763m   Calcium 9.5 8.5 - 10.1 MG/DL    Bilirubin, total 0.8 0.2 - 1.0 MG/DL    ALT (SGPT) 33 12 - 78 U/L    AST (SGOT) 31 15 - 37 U/L    Alk. phosphatase 102 45 - 117 U/L     Protein, total 7.3 6.4 - 8.2 g/dL    Albumin 4.1 3.5 - 5.0 g/dL    Globulin 3.2 2.0 - 4.0 g/dL    A-G Ratio 1.3 1.1 - 2.2     TSH 3RD GENERATION   Result Value Ref Range    TSH 2.82 0.36 - 3.74 uIU/mL   CBC WITH AUTOMATED DIFF   Result Value Ref Range    WBC 4.0 3.6 - 11.0 K/uL    RBC 4.66 3.80 - 5.20 M/uL    HGB 11.7 11.5 - 16.0 g/dL    HCT 38.6 35.0 - 47.0 %    MCV 82.8 80.0 - 99.0 FL    MCH 25.1 (L) 26.0 - 34.0 PG    MCHC 30.3 30.0 - 36.5 g/dL    RDW 12.8 11.5 - 14.5 %    PLATELET 211 150 - 400 K/uL    MPV 12.3 8.9 - 12.9 FL    NRBC 0.0 0 PER 100 WBC    ABSOLUTE NRBC 0.00 0.00 - 0.01 K/uL    NEUTROPHILS 55 32 - 75 %    LYMPHOCYTES 29 12 - 49 %    MONOCYTES 8 5 - 13 %    EOSINOPHILS 6 0 - 7 %    BASOPHILS 2 (H) 0 - 1 %    IMMATURE GRANULOCYTES 0 0.0 - 0.5 %    ABS. NEUTROPHILS 2.2 1.8 - 8.0 K/UL    ABS. LYMPHOCYTES 1.2 0.8 - 3.5 K/UL    ABS. MONOCYTES 0.3 0.0 - 1.0 K/UL    ABS. EOSINOPHILS 0.2 0.0 - 0.4 K/UL    ABS. BASOPHILS 0.1 0.0 - 0.1 K/UL    ABS. IMM. GRANS. 0.0 0.00 - 0.04 K/UL    DF AUTOMATED     HEMOGLOBIN A1C WITH EAG   Result Value Ref Range    Hemoglobin A1c 5.7 (H) 4.0 - 5.6 %    Est. average glucose 117 mg/dL

## 2018-11-12 NOTE — Progress Notes (Signed)
1. Have you been to the ER, urgent care clinic since your last visit?  Hospitalized since your last visit?No    2. Have you seen or consulted any other health care providers outside of the [facility] since your last visit?  Include any pap smears or colon screening. No     Chief Complaint   Patient presents with   ??? Results     Pharmacy verified.   [id number] - [place], [place] - [address]    3 most recent PHQ Screens 11/12/2018   Little interest or pleasure in doing things Not at all   Feeling down, depressed, irritable, or hopeless Not at all   Total Score PHQ 2 0   Trouble falling or staying asleep, or sleeping too much -   Feeling tired or having little energy -   Poor appetite, weight loss, or overeating -   Feeling bad about yourself - or that you are a failure or have let yourself or your family down -   Trouble concentrating on things such as school, work, reading, or watching TV -   Moving or speaking so slowly that other people could have noticed; or the opposite being so fidgety that others notice -   Thoughts of being better off dead, or hurting yourself in some way -   PHQ 9 Score -   How difficult have these problems made it for you to do your work, take care of your home and get along with others -     Visit Vitals  BP 128/80 (BP 1 Location: Left arm, BP Patient Position: Sitting)   Pulse 86   Temp 98 ??F (36.7 ??C) (Oral)   Resp 16   Ht 5' 7" (1.702 m)   Wt 171 lb (77.6 kg)   SpO2 100%   BMI 26.78 kg/m??

## 2018-11-12 NOTE — Progress Notes (Signed)
 1. Have you been to the ER, urgent care clinic since your last visit?  Hospitalized since your last visit?No    2. Have you seen or consulted any other health care providers outside of the [facility] since your last visit?  Include any pap smears or colon screening. No     Chief Complaint   Patient presents with   . Results     Pharmacy verified.   [facility] - [place], [place] - [address]    3 most recent PHQ Screens 11/12/2018   Little interest or pleasure in doing things Not at all   Feeling down, depressed, irritable, or hopeless Not at all   Total Score PHQ 2 0   Trouble falling or staying asleep, or sleeping too much -   Feeling tired or having little energy -   Poor appetite, weight loss, or overeating -   Feeling bad about yourself - or that you are a failure or have let yourself or your family down -   Trouble concentrating on things such as school, work, reading, or watching TV -   Moving or speaking so slowly that other people could have noticed; or the opposite being so fidgety that others notice -   Thoughts of being better off dead, or hurting yourself in some way -   PHQ 9 Score -   How difficult have these problems made it for you to do your work, take care of your home and get along with others -     Visit Vitals  BP 128/80 (BP 1 Location: Left arm, BP Patient Position: Sitting)   Pulse 86   Temp 98 F (36.7 C) (Oral)   Resp 16   Ht 5' 7 (1.702 m)   Wt 171 lb (77.6 kg)   SpO2 100%   BMI 26.78 kg/m

## 2018-11-12 NOTE — Progress Notes (Signed)
Assessment/Plan:     Diagnoses and all orders for this visit:    1. Vitamin D deficiency  -     cholecalciferol (VITAMIN D3) (1000 Units /25 mcg) tablet; Take 2 Tabs by mouth daily.  -     cholecalciferol (VITAMIN D3) (50,000 UNITS /1250 MCG) capsule; Take 1 Cap by mouth every seven (7) days.  -     VITAMIN D, 25 HYDROXY; Future  - Worsening, will replete with 50,000 unit tablet once a week for 8 weeks as discussed then a maintenance dose of 2000 units daily.  Labs in 3-4 months.      2. Elevated BUN  -     METABOLIC PANEL, BASIC; Future  - Please hydrate more            Discussed expected course/resolution/complications of diagnosis in detail with patient.    Medication risks/benefits/costs/interactions/alternatives discussed with patient.    Pt was given after visit summary which includes diagnoses, current medications & vitals.   Pt expressed understanding with the diagnosis and plan        Subjective:      Jacqueline Mckinney is a 53 y.o. female who presents for had concerns including Results.     Here today for follow up on low vitamin D.  All other labs normal.  Will repleat   Discussed repletion schedule with 50,000 unit tablets once a week for 8 weeks and then 2000 unit tablets for maintenance dose.     Current Outpatient Medications   Medication Sig Dispense Refill   ??? cholecalciferol (VITAMIN D3) (1000 Units /25 mcg) tablet Take 2 Tabs by mouth daily. 180 Tab 1   ??? cholecalciferol (VITAMIN D3) (50,000 UNITS /1250 MCG) capsule Take 1 Cap by mouth every seven (7) days. 8 Cap 0   ??? loratadine (Claritin) 10 mg tablet Take 1 Tab by mouth daily. 90 Tab 1   ??? levothyroxine (SYNTHROID) 112 mcg tablet TAKE 1 TABLET BY MOUTH ONCE DAILY BEFORE BREAKFAST 90 Tab 0   ??? hydroCHLOROthiazide (HYDRODIURIL) 12.5 mg tablet Take 1 Tab by mouth daily. 90 Tab 1       No Known Allergies  Past Medical History:   Diagnosis Date   ??? H/O seasonal allergies    ??? Hyperlipidemia    ??? Hypertension    ??? Hypothyroid    ??? Impaired glucose  tolerance      Past Surgical History:   Procedure Laterality Date   ??? 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 HX TONSIL AND ADENOIDECTOMY     ??? PR EGD DELIVER THERMAL ENERGY SPHNCTR/CARDIA GERD       Family History   Problem Relation Age of Onset   ??? Diabetes Mother      Social History     Socioeconomic History   ??? Marital status: SINGLE     Spouse name: Not on file   ??? Number of children: Not on file   ??? Years of education: Not on file   ??? Highest education level: Not on file   Occupational History   ??? Not on file   Social Needs   ??? Financial resource strain: Not on file   ??? Food insecurity     Worry: Not on file     Inability: Not on file   ??? Transportation needs     Medical: Not on file     Non-medical: Not on file  Tobacco Use   ??? Smoking status: Never Smoker   ??? Smokeless tobacco: Never Used   Substance and Sexual Activity   ??? Alcohol use: No   ??? Drug use: No   ??? Sexual activity: Never   Lifestyle   ??? Physical activity     Days per week: Not on file     Minutes per session: Not on file   ??? Stress: Not on file   Relationships   ??? Social Product manager on phone: Not on file     Gets together: Not on file     Attends religious service: Not on file     Active member of club or organization: Not on file     Attends meetings of clubs or organizations: Not on file     Relationship status: Not on file   ??? Intimate partner violence     Fear of current or ex partner: Not on file     Emotionally abused: Not on file     Physically abused: Not on file     Forced sexual activity: Not on file   Other Topics Concern   ??? Not on file   Social History Narrative   ??? Not on file       HPI      ROS:   Review of Systems   Constitutional: Negative for chills, fever and malaise/fatigue.   Eyes: Negative for blurred vision.   Respiratory: Negative for cough and shortness of breath.    Cardiovascular: Negative for chest pain, palpitations and leg swelling.   Neurological: Negative for dizziness  and headaches.       Objective:     Visit Vitals  BP 128/80 (BP 1 Location: Left arm, BP Patient Position: Sitting)   Pulse 86   Temp 98 ??F (36.7 ??C) (Oral)   Resp 16   Ht _0  (1.702 m)   Wt 171 lb (77.6 kg)   SpO2 100%   BMI 26.78 kg/m??         Vitals and Nurse Documentation reviewed.     Physical Exam  Constitutional:       General: She is not in acute distress.     Appearance: She is not diaphoretic.   HENT:      Head: Normocephalic and atraumatic.   Cardiovascular:      Rate and Rhythm: Normal rate and regular rhythm.      Heart sounds: Normal heart sounds. No murmur. No friction rub. No gallop.    Pulmonary:      Effort: Pulmonary effort is normal. No respiratory distress.      Breath sounds: Normal breath sounds. No wheezing or rales.   Skin:     General: Skin is warm and dry.      Coloration: Skin is not pale.   Neurological:      Mental Status: She is alert and oriented to person, place, and time.   Psychiatric:         Mood and Affect: Affect normal. Mood is not anxious or depressed.         Behavior: Behavior is not agitated.         Thought Content: Thought content does not include homicidal or suicidal ideation.         Results for orders placed or performed in visit on 11/07/18   VITAMIN D, 25 HYDROXY   Result Value Ref Range    Vitamin D 25-Hydroxy 18.7 (L) 30 -  841 ng/mL   METABOLIC PANEL, COMPREHENSIVE   Result Value Ref Range    Sodium 137 136 - 145 mmol/L    Potassium 3.8 3.5 - 5.1 mmol/L    Chloride 102 97 - 108 mmol/L    CO2 31 21 - 32 mmol/L    Anion gap 4 (L) 5 - 15 mmol/L    Glucose 95 65 - 100 mg/dL    BUN 19 6 - 20 MG/DL    Creatinine 0.63 0.55 - 1.02 MG/DL    BUN/Creatinine ratio 30 (H) 12 - 20      GFR est AA >60 >60 ml/min/1.57m    GFR est non-AA >60 >60 ml/min/1.7100m   Calcium 9.5 8.5 - 10.1 MG/DL    Bilirubin, total 0.8 0.2 - 1.0 MG/DL    ALT (SGPT) 33 12 - 78 U/L    AST (SGOT) 31 15 - 37 U/L    Alk. phosphatase 102 45 - 117 U/L    Protein, total 7.3 6.4 - 8.2 g/dL    Albumin 4.1  3.5 - 5.0 g/dL    Globulin 3.2 2.0 - 4.0 g/dL    A-G Ratio 1.3 1.1 - 2.2     TSH 3RD GENERATION   Result Value Ref Range    TSH 2.82 0.36 - 3.74 uIU/mL   CBC WITH AUTOMATED DIFF   Result Value Ref Range    WBC 4.0 3.6 - 11.0 K/uL    RBC 4.66 3.80 - 5.20 M/uL    HGB 11.7 11.5 - 16.0 g/dL    HCT 38.6 35.0 - 47.0 %    MCV 82.8 80.0 - 99.0 FL    MCH 25.1 (L) 26.0 - 34.0 PG    MCHC 30.3 30.0 - 36.5 g/dL    RDW 12.8 11.5 - 14.5 %    PLATELET 211 150 - 400 K/uL    MPV 12.3 8.9 - 12.9 FL    NRBC 0.0 0 PER 100 WBC    ABSOLUTE NRBC 0.00 0.00 - 0.01 K/uL    NEUTROPHILS 55 32 - 75 %    LYMPHOCYTES 29 12 - 49 %    MONOCYTES 8 5 - 13 %    EOSINOPHILS 6 0 - 7 %    BASOPHILS 2 (H) 0 - 1 %    IMMATURE GRANULOCYTES 0 0.0 - 0.5 %    ABS. NEUTROPHILS 2.2 1.8 - 8.0 K/UL    ABS. LYMPHOCYTES 1.2 0.8 - 3.5 K/UL    ABS. MONOCYTES 0.3 0.0 - 1.0 K/UL    ABS. EOSINOPHILS 0.2 0.0 - 0.4 K/UL    ABS. BASOPHILS 0.1 0.0 - 0.1 K/UL    ABS. IMM. GRANS. 0.0 0.00 - 0.04 K/UL    DF AUTOMATED     HEMOGLOBIN A1C WITH EAG   Result Value Ref Range    Hemoglobin A1c 5.7 (H) 4.0 - 5.6 %    Est. average glucose 117 mg/dL

## 2018-12-10 ENCOUNTER — Ambulatory Visit: Admit: 2018-12-10 | Discharge: 2018-12-10 | Payer: BLUE CROSS/BLUE SHIELD | Attending: Family Medicine | Primary: Family

## 2018-12-10 ENCOUNTER — Ambulatory Visit: Attending: Family Medicine | Primary: Family

## 2018-12-10 DIAGNOSIS — I10 Essential (primary) hypertension: Secondary | ICD-10-CM

## 2018-12-10 MED ORDER — HYDROCHLOROTHIAZIDE 25 MG TAB
25 mg | ORAL_TABLET | Freq: Every day | ORAL | 1 refills | Status: DC
Start: 2018-12-10 — End: 2019-05-22

## 2018-12-10 NOTE — Progress Notes (Signed)
Identified pt with two pt identifiers(name and DOB). Reviewed record in preparation for visit and have obtained necessary documentation.  Chief Complaint   Patient presents with   ??? Dizziness     started this morning        Health Maintenance Due   Topic   ??? DTaP/Tdap/Td series (1 - Tdap)   ??? Shingrix Vaccine Age 52> (1 of 2)   ??? Colorectal Cancer Screening Combo    ??? Flu Vaccine (1)       Visit Vitals  Blood Pressure (Abnormal) 150/85 (BP 1 Location: Left arm, BP Patient Position: Sitting)   Pulse 81   Temperature 97.8 ??F (36.6 ??C) (Oral)   Respiration 16   Height 5' 7" (1.702 m)   Weight 173 lb (78.5 kg)   Oxygen Saturation 99%   Body Mass Index 27.10 kg/m??         Coordination of Care Questionnaire:  :   1) Have you been to an emergency room, urgent care, or hospitalized since your last visit?  NO      2. Have seen or consulted any other health care provider since your last visit?   NO        Patient is accompanied by  I have received verbal consent from Emmeline D Doo to discuss any/all medical information while they are present in the room.

## 2018-12-10 NOTE — Progress Notes (Signed)
[facility]  [address], [address]  [place], [place]  [address]  Dr. Winn Jock. Dareen Piano  Phone:  [phone]  Fax:  [phone]    Progress Note    Name:  Jacqueline Mckinney  Age:  53 y.o.  DOB:  [birth year]  Encounter Date:  12/10/2018    Primary Care Provider:  Amalia Greenhouse, MD    Chief Complaint   Patient presents with   ??? Dizziness     started this morning     HPI:  Patient here to follow-up BP and dizzy episode.  Pt had a BP reading of 161/94 at her employment when dizzy so they sent her her for evaluation. Recent lab work was good. Pt has no Chest pain or dyspnea.     Past Medical History:   Diagnosis Date   ??? H/O seasonal allergies    ??? Hyperlipidemia    ??? Hypertension    ??? Hypothyroid    ??? Impaired glucose tolerance      Past Surgical History:   Procedure Laterality Date   ??? 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 HX TONSIL AND ADENOIDECTOMY     ??? PR EGD DELIVER THERMAL ENERGY SPHNCTR/CARDIA GERD       Family History   Problem Relation Age of Onset   ??? Diabetes Mother      Social History     Socioeconomic History   ??? Marital status: SINGLE     Spouse name: Not on file   ??? Number of children: Not on file   ??? Years of education: Not on file   ??? Highest education level: Not on file   Tobacco Use   ??? Smoking status: Never Smoker   ??? Smokeless tobacco: Never Used   Substance and Sexual Activity   ??? Alcohol use: No   ??? Drug use: No   ??? Sexual activity: Never       Current Outpatient Medications   Medication Sig Dispense Refill   ??? hydroCHLOROthiazide (HYDRODIURIL) 25 mg tablet Take 1 Tab by mouth daily. 90 Tab 1   ??? cholecalciferol (VITAMIN D3) (1000 Units /25 mcg) tablet Take 2 Tabs by mouth daily. 180 Tab 1   ??? cholecalciferol (VITAMIN D3) (50,000 UNITS /1250 MCG) capsule Take 1 Cap by mouth every seven (7) days. 8 Cap 0   ??? levothyroxine (SYNTHROID) 112 mcg tablet TAKE 1 TABLET BY MOUTH ONCE DAILY BEFORE BREAKFAST 90 Tab 0     No Known Allergies   Patient Active Problem List   Diagnosis Code   ??? Essential hypertension I10   ??? Impaired glucose tolerance R73.02   ??? Primary hypothyroidism E03.9   ??? Rash R21   ??? Unsatisfactory vaginal cytology smear R87.625       Review of Systems   Constitutional: Negative for fever.   Respiratory: Negative for shortness of breath.    Cardiovascular: Negative for chest pain, orthopnea and PND.   Gastrointestinal: Negative for abdominal pain, nausea and vomiting.       Visit Vitals  BP (!) 150/85 (BP 1 Location: Left arm, BP Patient Position: Sitting)   Pulse 81   Temp 97.8 ??F (36.6 ??C) (Oral)   Resp 16   Ht 5\' 7"  (1.702 m)   Wt 173 lb (78.5 kg)   SpO2 99%   BMI 27.10 kg/m??     Physical Exam  Cardiovascular:      Rate  and Rhythm: Normal rate and regular rhythm.      Heart sounds: Normal heart sounds.   Pulmonary:      Effort: Pulmonary effort is normal. No respiratory distress.      Breath sounds: Normal breath sounds. No wheezing.   Skin:     General: Skin is warm and dry.   Neurological:      General: No focal deficit present.      Mental Status: She is alert and oriented to person, place, and time.      Cranial Nerves: Cranial nerves are intact.      Motor: Motor function is intact.      Coordination: Coordination is intact.         Labs/Radiology Reviewed    Assessment/Plan:    ICD-10-CM ICD-9-CM    1. Essential hypertension  I10 401.9 hydroCHLOROthiazide (HYDRODIURIL) 25 mg tablet   2. History of seasonal allergies  Z88.9 V15.09        Orders Placed This Encounter   ??? hydroCHLOROthiazide (HYDRODIURIL) 25 mg tablet             Rayvon Char, MD  12/10/2018

## 2018-12-10 NOTE — Progress Notes (Signed)
Identified pt with two pt identifiers(name and DOB). Reviewed record in preparation for visit and have obtained necessary documentation.  Chief Complaint   Patient presents with   . Dizziness     started this morning        Health Maintenance Due   Topic   . DTaP/Tdap/Td series (1 - Tdap)   . Shingrix Vaccine Age 52> (1 of 2)   . Colorectal Cancer Screening Combo    . Flu Vaccine (1)       Visit Vitals  Blood Pressure (Abnormal) 150/85 (BP 1 Location: Left arm, BP Patient Position: Sitting)   Pulse 81   Temperature 97.8 F (36.6 C) (Oral)   Respiration 16   Height 5\' 7"  (1.702 m)   Weight 173 lb (78.5 kg)   Oxygen Saturation 99%   Body Mass Index 27.10 kg/m         Coordination of Care Questionnaire:  :   1) Have you been to an emergency room, urgent care, or hospitalized since your last visit?  NO      2. Have seen or consulted any other health care provider since your last visit?   NO        Patient is accompanied by  I have received verbal consent from Jae Dire to discuss any/all medical information while they are present in the room.

## 2018-12-10 NOTE — Progress Notes (Signed)
[facility]  [address]  [place], [place]  [address]  Dr. Winn Jock. Dareen Piano  Phone:  [phone]  Fax:  [phone]    Progress Note    Name:  Jacqueline Mckinney  Age:  53 y.o.  DOB:  1965/07/15  Encounter Date:  12/10/2018    Primary Care Provider:  Amalia Greenhouse, MD    Chief Complaint   Patient presents with   ??? Dizziness     started this morning     HPI:  Patient here to follow-up BP and dizzy episode.  Pt had a BP reading of 161/94 at her employment when dizzy so they sent her her for evaluation. Recent lab work was good. Pt has no Chest pain or dyspnea.     Past Medical History:   Diagnosis Date   ??? H/O seasonal allergies    ??? Hyperlipidemia    ??? Hypertension    ??? Hypothyroid    ??? Impaired glucose tolerance      Past Surgical History:   Procedure Laterality Date   ??? 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 HX TONSIL AND ADENOIDECTOMY     ??? PR EGD DELIVER THERMAL ENERGY SPHNCTR/CARDIA GERD       Family History   Problem Relation Age of Onset   ??? Diabetes Mother      Social History     Socioeconomic History   ??? Marital status: SINGLE     Spouse name: Not on file   ??? Number of children: Not on file   ??? Years of education: Not on file   ??? Highest education level: Not on file   Tobacco Use   ??? Smoking status: Never Smoker   ??? Smokeless tobacco: Never Used   Substance and Sexual Activity   ??? Alcohol use: No   ??? Drug use: No   ??? Sexual activity: Never       Current Outpatient Medications   Medication Sig Dispense Refill   ??? hydroCHLOROthiazide (HYDRODIURIL) 25 mg tablet Take 1 Tab by mouth daily. 90 Tab 1   ??? cholecalciferol (VITAMIN D3) (1000 Units /25 mcg) tablet Take 2 Tabs by mouth daily. 180 Tab 1   ??? cholecalciferol (VITAMIN D3) (50,000 UNITS /1250 MCG) capsule Take 1 Cap by mouth every seven (7) days. 8 Cap 0   ??? levothyroxine (SYNTHROID) 112 mcg tablet TAKE 1 TABLET BY MOUTH ONCE DAILY BEFORE BREAKFAST 90 Tab 0     No Known Allergies  Patient  Active Problem List   Diagnosis Code   ??? Essential hypertension I10   ??? Impaired glucose tolerance R73.02   ??? Primary hypothyroidism E03.9   ??? Rash R21   ??? Unsatisfactory vaginal cytology smear R87.625       Review of Systems   Constitutional: Negative for fever.   Respiratory: Negative for shortness of breath.    Cardiovascular: Negative for chest pain, orthopnea and PND.   Gastrointestinal: Negative for abdominal pain, nausea and vomiting.       Visit Vitals  BP (!) 150/85 (BP 1 Location: Left arm, BP Patient Position: Sitting)   Pulse 81   Temp 97.8 ??F (36.6 ??C) (Oral)   Resp 16   Ht 5\' 7"  (1.702 m)   Wt 173 lb (78.5 kg)   SpO2 99%   BMI 27.10 kg/m??     Physical Exam  Cardiovascular:      Rate  and Rhythm: Normal rate and regular rhythm.      Heart sounds: Normal heart sounds.   Pulmonary:      Effort: Pulmonary effort is normal. No respiratory distress.      Breath sounds: Normal breath sounds. No wheezing.   Skin:     General: Skin is warm and dry.   Neurological:      General: No focal deficit present.      Mental Status: She is alert and oriented to person, place, and time.      Cranial Nerves: Cranial nerves are intact.      Motor: Motor function is intact.      Coordination: Coordination is intact.         Labs/Radiology Reviewed    Assessment/Plan:    ICD-10-CM ICD-9-CM    1. Essential hypertension  I10 401.9 hydroCHLOROthiazide (HYDRODIURIL) 25 mg tablet   2. History of seasonal allergies  Z88.9 V15.09        Orders Placed This Encounter   ??? hydroCHLOROthiazide (HYDRODIURIL) 25 mg tablet             Rayvon Char, MD  12/10/2018

## 2019-01-24 ENCOUNTER — Ambulatory Visit: Admit: 2019-01-24 | Payer: BLUE CROSS/BLUE SHIELD | Attending: Family Medicine | Primary: Family Medicine

## 2019-01-24 ENCOUNTER — Ambulatory Visit: Attending: Family Medicine | Primary: Family

## 2019-01-24 DIAGNOSIS — U071 COVID-19: Secondary | ICD-10-CM

## 2019-01-24 MED ORDER — NEBULIZER ACCESSORIES KIT
PACK | 0 refills | Status: AC
Start: 2019-01-24 — End: 2021-05-14

## 2019-01-24 MED ORDER — LEVOTHYROXINE 112 MCG TAB
112 mcg | ORAL_TABLET | Freq: Every day | ORAL | 1 refills | Status: DC
Start: 2019-01-24 — End: 2019-05-22

## 2019-01-24 MED ORDER — ALBUTEROL SULFATE HFA 90 MCG/ACTUATION AEROSOL INHALER
90 mcg/actuation | RESPIRATORY_TRACT | 5 refills | Status: AC | PRN
Start: 2019-01-24 — End: 2021-05-14

## 2019-01-24 MED ORDER — ALBUTEROL SULFATE 0.083 % (0.83 MG/ML) SOLN FOR INHALATION
2.5 mg /3 mL (0.083 %) | INHALATION_SOLUTION | RESPIRATORY_TRACT | 5 refills | Status: DC | PRN
Start: 2019-01-24 — End: 2021-05-14

## 2019-01-24 MED ORDER — NEBULIZER & COMPRESSOR
0 refills | Status: AC
Start: 2019-01-24 — End: 2021-05-14

## 2019-01-24 NOTE — Progress Notes (Signed)
Jacqueline Mckinney is a 54 y.o. female evaluated via telephone on 01/24/2019.      Consent:  She and/or health care decision maker is aware that that she may receive a bill for this telephone service, depending on her insurance coverage, and has provided verbal consent to proceed: Yes      Documentation:  I communicated with the patient and/or health care decision maker about see below.   Details of this discussion including any medical advice provided:      Assessment & Plan:   Diagnoses and all orders for this visit:    1. COVID-19 virus infection  -     Oncologist; Use as needed for shortness of breath  -     Nebulizer & Compressor machine; Use as needed for shortness of breath  -     albuterol (PROVENTIL HFA, VENTOLIN HFA, PROAIR HFA) 90 mcg/actuation inhaler; Take 1 Puff by inhalation every four (4) hours as needed for Wheezing or Shortness of Breath.  -     albuterol (PROVENTIL VENTOLIN) 2.5 mg /3 mL (0.083 %) nebu; 1.5 mL by Nebulization route every four (4) hours as needed for Shortness of Breath.    2. Acquired hypothyroidism  -     levothyroxine (SYNTHROID) 112 mcg tablet; Take 1 Tab by mouth Daily (before breakfast).    3. Shortness of breath  -     Nebulizer Accessories kit; Use as needed for shortness of breath  -     Nebulizer & Compressor machine; Use as needed for shortness of breath  -     albuterol (PROVENTIL HFA, VENTOLIN HFA, PROAIR HFA) 90 mcg/actuation inhaler; Take 1 Puff by inhalation every four (4) hours as needed for Wheezing or Shortness of Breath.  -     albuterol (PROVENTIL VENTOLIN) 2.5 mg /3 mL (0.083 %) nebu; 1.5 mL by Nebulization route every four (4) hours as needed for Shortness of Breath.          Follow-up and Dispositions    ?? Return if symptoms worsen or fail to improve.           I ADVISED PATIENT TO GO TO ER IF SYMPTOMS WORSEN , CHANGE OR FAILS TO IMPROVE.  712   Jacqueline Mckinney is a 54 y.o. female who was seen for Concern For COVID-19 (Coronavirus) (Follow up [place] fatigue, still having a hard time breathing    11 days ago)      1. Acquired hypothyroidism  Requests refills of levothyroxine.    2. COVID-19 virus infection  Patient tested positive for COVID-19 on 01/14/2019.  Test was performed at her job.  Patient works at [employer].  She was sent home to be returned in 10 days by her job.  On 01/18/2019 patient symptoms got worse and her son called EMS and was taken to [facility].  X-rays and blood tests were performed.  Patient was prescribed prednisone, albuterol inhaler, benzonatate.  Patient continues to have symptoms of fatigue and shortness of breath.  She is currently taking Advil.  We discussed that since she is currently symptomatic with her COVID-19 symptoms she should not go back to work.  I will give her a work note.    3. Shortness of breath  Patient continues to have symptoms of shortness of breath and cough.  She was prescribed albuterol inhaler and Tessalon Perles from the ER.  She was also given prednisone taper.  She is  currently taking prednisone and benzonatate.  She reports albuterol inhalers that she received from the pharmacy does not work.  She is requesting refills of inhaler and a nebulizer machine.        Prior to Admission medications    Medication Sig Start Date End Date Taking? Authorizing Provider   levothyroxine (SYNTHROID) 112 mcg tablet Take 1 Tab by mouth Daily (before breakfast). 01/24/19  Yes Lubertha Basque, MD   Nebulizer Accessories kit Use as needed for shortness of breath 01/24/19  Yes Lubertha Basque, MD   Nebulizer & Compressor machine Use as needed for shortness of breath 01/24/19  Yes Dyshon Philbin, MD    albuterol (PROVENTIL HFA, VENTOLIN HFA, PROAIR HFA) 90 mcg/actuation inhaler Take 1 Puff by inhalation every four (4) hours as needed for Wheezing or Shortness of Breath. 01/24/19  Yes Eston Heslin, MD   albuterol (PROVENTIL VENTOLIN) 2.5 mg /3 mL (0.083 %) nebu 1.5 mL by Nebulization route every four (4) hours as needed for Shortness of Breath. 01/24/19  Yes Judaea Burgoon, MD   hydroCHLOROthiazide (HYDRODIURIL) 25 mg tablet Take 1 Tab by mouth daily. 12/10/18  Yes Rayvon Char, MD   cholecalciferol (VITAMIN D3) (1000 Units /25 mcg) tablet Take 2 Tabs by mouth daily. 11/12/18  Yes Combs, Dani Gobble, NP   cholecalciferol (VITAMIN D3) (50,000 UNITS /1250 MCG) capsule Take 1 Cap by mouth every seven (7) days. 11/12/18  Yes Combs, Dani Gobble, NP   levothyroxine (SYNTHROID) 112 mcg tablet TAKE 1 TABLET BY MOUTH ONCE DAILY BEFORE BREAKFAST 10/29/18 01/24/19  Marguarite Arbour, NP     No Known Allergies    Patient Active Problem List   Diagnosis Code   ??? Essential hypertension I10   ??? Impaired glucose tolerance R73.02   ??? Primary hypothyroidism E03.9   ??? Rash R21   ??? Unsatisfactory vaginal cytology smear R87.625     Patient Active Problem List    Diagnosis Date Noted   ??? Essential hypertension 09/22/2016   ??? Impaired glucose tolerance 09/22/2016   ??? Primary hypothyroidism 09/22/2016   ??? Rash 09/22/2016   ??? Unsatisfactory vaginal cytology smear 09/22/2016     Current Outpatient Medications   Medication Sig Dispense Refill   ??? levothyroxine (SYNTHROID) 112 mcg tablet Take 1 Tab by mouth Daily (before breakfast). 90 Tab 1   ??? Nebulizer Accessories kit Use as needed for shortness of breath 1 Kit 0   ??? Nebulizer & Compressor machine Use as needed for shortness of breath 1 Each 0   ??? albuterol (PROVENTIL HFA, VENTOLIN HFA, PROAIR HFA) 90 mcg/actuation inhaler Take 1 Puff by inhalation every four (4) hours as needed for Wheezing or Shortness of Breath. 1 Inhaler 5    ??? albuterol (PROVENTIL VENTOLIN) 2.5 mg /3 mL (0.083 %) nebu 1.5 mL by Nebulization route every four (4) hours as needed for Shortness of Breath. 30 Nebule 5   ??? hydroCHLOROthiazide (HYDRODIURIL) 25 mg tablet Take 1 Tab by mouth daily. 90 Tab 1   ??? cholecalciferol (VITAMIN D3) (1000 Units /25 mcg) tablet Take 2 Tabs by mouth daily. 180 Tab 1   ??? cholecalciferol (VITAMIN D3) (50,000 UNITS /1250 MCG) capsule Take 1 Cap by mouth every seven (7) days. 8 Cap 0     No Known Allergies  Past Medical History:   Diagnosis Date   ??? H/O seasonal allergies    ??? Hyperlipidemia    ??? Hypertension    ??? Hypothyroid    ??? Impaired glucose  tolerance      Past Surgical History:   Procedure Laterality Date   ??? 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 HX TONSIL AND ADENOIDECTOMY     ??? PR EGD DELIVER THERMAL ENERGY SPHNCTR/CARDIA GERD       Family History   Problem Relation Age of Onset   ??? Diabetes Mother      Social History     Tobacco Use   ??? Smoking status: Never Smoker   ??? Smokeless tobacco: Never Used   Substance Use Topics   ??? Alcohol use: No       Review of Systems   Constitutional: Positive for malaise/fatigue.   HENT: Negative.    Eyes: Negative.    Respiratory: Positive for cough and shortness of breath.    Cardiovascular: Negative.    Gastrointestinal: Negative.    Genitourinary: Negative.    Musculoskeletal: Negative.    Skin: Negative.    Neurological: Negative.    Endo/Heme/Allergies: Negative.    Psychiatric/Behavioral: Negative.            Patient was located at her home. I was at office while conducting this encounter.     I affirm this is a Patient Initiated Episode with an Established Patient who has not had a related appointment within my department in the past 7 days or scheduled within the next 24 hours.    Total Time: minutes: 21-30 minutes    Note: not billable if this call serves to triage the patient into an appointment for the relevant concern      Lubertha Basque, MD

## 2019-01-24 NOTE — Progress Notes (Signed)
Patient stated name & dob    Chief Complaint   Patient presents with   ??? Concern For COVID-19 (Coronavirus)     Follow up [facility]   Still fatigue, still having a hard time breathing    11 days ago        Health Maintenance Due   Topic   ??? DTaP/Tdap/Td series (1 - Tdap)   ??? Shingrix Vaccine Age 53> (1 of 2)   ??? Colorectal Cancer Screening Combo    ??? Flu Vaccine (1)       Wt Readings from Last 3 Encounters:   12/10/18 173 lb (78.5 kg)   11/12/18 171 lb (77.6 kg)   11/07/18 170 lb 9.6 oz (77.4 kg)     Temp Readings from Last 3 Encounters:   12/10/18 97.8 ??F (36.6 ??C) (Oral)   11/12/18 98 ??F (36.7 ??C) (Oral)   11/07/18 97.2 ??F (36.2 ??C) (Oral)     BP Readings from Last 3 Encounters:   12/10/18 (!) 150/85   11/12/18 128/80   11/07/18 115/73     Pulse Readings from Last 3 Encounters:   12/10/18 81   11/12/18 86   11/07/18 72         Learning Assessment:  :     Learning Assessment 01/13/2017   PRIMARY LEARNER Patient   PRIMARY LANGUAGE SPANISH   LEARNER PREFERENCE PRIMARY DEMONSTRATION   ANSWERED BY patient   RELATIONSHIP SELF       Depression Screening:  :     3 most recent PHQ Screens 01/24/2019   Little interest or pleasure in doing things Not at all   Feeling down, depressed, irritable, or hopeless Not at all   Total Score PHQ 2 0   Trouble falling or staying asleep, or sleeping too much -   Feeling tired or having little energy -   Poor appetite, weight loss, or overeating -   Feeling bad about yourself - or that you are a failure or have let yourself or your family down -   Trouble concentrating on things such as school, work, reading, or watching TV -   Moving or speaking so slowly that other people could have noticed; or the opposite being so fidgety that others notice -   Thoughts of being better off dead, or hurting yourself in some way -   PHQ 9 Score -   How difficult have these problems made it for you to do your work, take care of your home and get along with others -       Fall Risk Assessment:  :      No flowsheet data found.    Abuse Screening:  :     Abuse Screening Questionnaire 01/13/2017   Do you ever feel afraid of your partner? N   Are you in a relationship with someone who physically or mentally threatens you? N   Is it safe for you to go home? Y       Coordination of Care Questionnaire:  :     1) Have you been to an emergency room, urgent care clinic since your last visit? Yes [facility] 11 days ago  Covid    Hospitalized since your last visit?   No           2) Have you seen or consulted any other health care providers outside of [facility] since your last visit? See above  (Include any pap smears or colon screenings  in this section.)    Patient is accompanied by VV I have received verbal consent from Fayetta D Guimond to discuss any/all medical information while they are present in the room.

## 2019-01-24 NOTE — Progress Notes (Signed)
Jacqueline Mckinney is a 54 y.o. female evaluated via telephone on 01/24/2019.      Consent:  She and/or health care decision maker is aware that that she may receive a bill for this telephone service, depending on her insurance coverage, and has provided verbal consent to proceed: Yes      Documentation:  I communicated with the patient and/or health care decision maker about see below.   Details of this discussion including any medical advice provided:      Assessment & Plan:   Diagnoses and all orders for this visit:    1. COVID-19 virus infection  -     Oncologist; Use as needed for shortness of breath  -     Nebulizer & Compressor machine; Use as needed for shortness of breath  -     albuterol (PROVENTIL HFA, VENTOLIN HFA, PROAIR HFA) 90 mcg/actuation inhaler; Take 1 Puff by inhalation every four (4) hours as needed for Wheezing or Shortness of Breath.  -     albuterol (PROVENTIL VENTOLIN) 2.5 mg /3 mL (0.083 %) nebu; 1.5 mL by Nebulization route every four (4) hours as needed for Shortness of Breath.    2. Acquired hypothyroidism  -     levothyroxine (SYNTHROID) 112 mcg tablet; Take 1 Tab by mouth Daily (before breakfast).    3. Shortness of breath  -     Nebulizer Accessories kit; Use as needed for shortness of breath  -     Nebulizer & Compressor machine; Use as needed for shortness of breath  -     albuterol (PROVENTIL HFA, VENTOLIN HFA, PROAIR HFA) 90 mcg/actuation inhaler; Take 1 Puff by inhalation every four (4) hours as needed for Wheezing or Shortness of Breath.  -     albuterol (PROVENTIL VENTOLIN) 2.5 mg /3 mL (0.083 %) nebu; 1.5 mL by Nebulization route every four (4) hours as needed for Shortness of Breath.          Follow-up and Dispositions    ?? Return if symptoms worsen or fail to improve.           I ADVISED PATIENT TO GO TO ER IF SYMPTOMS WORSEN , CHANGE OR FAILS TO IMPROVE.  712  Jacqueline Mckinney is a 54 y.o. female who was seen for Concern For COVID-19 (Coronavirus) (Follow up  [place] fatigue, still having a hard time breathing    11 days ago)      1. Acquired hypothyroidism  Requests refills of levothyroxine.    2. COVID-19 virus infection  Patient tested positive for COVID-19 on 01/14/2019.  Test was performed at her job.  Patient works at [employer].  She was sent home to be returned in 10 days by her job.  On 01/18/2019 patient symptoms got worse and her son called EMS and was taken to [facility].  X-rays and blood tests were performed.  Patient was prescribed prednisone, albuterol inhaler, benzonatate.  Patient continues to have symptoms of fatigue and shortness of breath.  She is currently taking Advil.  We discussed that since she is currently symptomatic with her COVID-19 symptoms she should not go back to work.  I will give her a work note.    3. Shortness of breath  Patient continues to have symptoms of shortness of breath and cough.  She was prescribed albuterol inhaler and Tessalon Perles from the ER.  She was also given prednisone taper.  She is  currently taking prednisone and benzonatate.  She reports albuterol inhalers that she received from the pharmacy does not work.  She is requesting refills of inhaler and a nebulizer machine.        Prior to Admission medications    Medication Sig Start Date End Date Taking? Authorizing Provider   levothyroxine (SYNTHROID) 112 mcg tablet Take 1 Tab by mouth Daily (before breakfast). 01/24/19  Yes Lubertha Basque, MD   Nebulizer Accessories kit Use as needed for shortness of breath 01/24/19  Yes Lubertha Basque, MD   Nebulizer & Compressor machine Use as needed for shortness of breath 01/24/19  Yes Romilda Proby, MD   albuterol (PROVENTIL HFA, VENTOLIN HFA, PROAIR HFA) 90 mcg/actuation inhaler Take 1 Puff by inhalation every four (4) hours as needed for Wheezing or Shortness of Breath. 01/24/19  Yes Collie Kittel, MD   albuterol (PROVENTIL VENTOLIN) 2.5 mg /3 mL (0.083 %) nebu 1.5 mL by Nebulization route  every four (4) hours as needed for Shortness of Breath. 01/24/19  Yes Lonya Johannesen, MD   hydroCHLOROthiazide (HYDRODIURIL) 25 mg tablet Take 1 Tab by mouth daily. 12/10/18  Yes Rayvon Char, MD   cholecalciferol (VITAMIN D3) (1000 Units /25 mcg) tablet Take 2 Tabs by mouth daily. 11/12/18  Yes Combs, Dani Gobble, NP   cholecalciferol (VITAMIN D3) (50,000 UNITS /1250 MCG) capsule Take 1 Cap by mouth every seven (7) days. 11/12/18  Yes Combs, Dani Gobble, NP   levothyroxine (SYNTHROID) 112 mcg tablet TAKE 1 TABLET BY MOUTH ONCE DAILY BEFORE BREAKFAST 10/29/18 01/24/19  Marguarite Arbour, NP     No Known Allergies    Patient Active Problem List   Diagnosis Code   ??? Essential hypertension I10   ??? Impaired glucose tolerance R73.02   ??? Primary hypothyroidism E03.9   ??? Rash R21   ??? Unsatisfactory vaginal cytology smear R87.625     Patient Active Problem List    Diagnosis Date Noted   ??? Essential hypertension 09/22/2016   ??? Impaired glucose tolerance 09/22/2016   ??? Primary hypothyroidism 09/22/2016   ??? Rash 09/22/2016   ??? Unsatisfactory vaginal cytology smear 09/22/2016     Current Outpatient Medications   Medication Sig Dispense Refill   ??? levothyroxine (SYNTHROID) 112 mcg tablet Take 1 Tab by mouth Daily (before breakfast). 90 Tab 1   ??? Nebulizer Accessories kit Use as needed for shortness of breath 1 Kit 0   ??? Nebulizer & Compressor machine Use as needed for shortness of breath 1 Each 0   ??? albuterol (PROVENTIL HFA, VENTOLIN HFA, PROAIR HFA) 90 mcg/actuation inhaler Take 1 Puff by inhalation every four (4) hours as needed for Wheezing or Shortness of Breath. 1 Inhaler 5   ??? albuterol (PROVENTIL VENTOLIN) 2.5 mg /3 mL (0.083 %) nebu 1.5 mL by Nebulization route every four (4) hours as needed for Shortness of Breath. 30 Nebule 5   ??? hydroCHLOROthiazide (HYDRODIURIL) 25 mg tablet Take 1 Tab by mouth daily. 90 Tab 1   ??? cholecalciferol (VITAMIN D3) (1000 Units /25 mcg) tablet Take 2 Tabs by mouth daily. 180 Tab 1   ???  cholecalciferol (VITAMIN D3) (50,000 UNITS /1250 MCG) capsule Take 1 Cap by mouth every seven (7) days. 8 Cap 0     No Known Allergies  Past Medical History:   Diagnosis Date   ??? H/O seasonal allergies    ??? Hyperlipidemia    ??? Hypertension    ??? Hypothyroid    ??? Impaired glucose  tolerance      Past Surgical History:   Procedure Laterality Date   ??? 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 HX TONSIL AND ADENOIDECTOMY     ??? PR EGD DELIVER THERMAL ENERGY SPHNCTR/CARDIA GERD       Family History   Problem Relation Age of Onset   ??? Diabetes Mother      Social History     Tobacco Use   ??? Smoking status: Never Smoker   ??? Smokeless tobacco: Never Used   Substance Use Topics   ??? Alcohol use: No       Review of Systems   Constitutional: Positive for malaise/fatigue.   HENT: Negative.    Eyes: Negative.    Respiratory: Positive for cough and shortness of breath.    Cardiovascular: Negative.    Gastrointestinal: Negative.    Genitourinary: Negative.    Musculoskeletal: Negative.    Skin: Negative.    Neurological: Negative.    Endo/Heme/Allergies: Negative.    Psychiatric/Behavioral: Negative.            Patient was located at her home. I was at office while conducting this encounter.     I affirm this is a Patient Initiated Episode with an Established Patient who has not had a related appointment within my department in the past 7 days or scheduled within the next 24 hours.    Total Time: minutes: 21-30 minutes    Note: not billable if this call serves to triage the patient into an appointment for the relevant concern      Lubertha Basque, MD

## 2019-01-24 NOTE — Progress Notes (Signed)
 Patient stated name & dob    Chief Complaint   Patient presents with   . Concern For COVID-19 (Coronavirus)     Follow up [facility]   Still fatigue, still having a hard time breathing    11 days ago        Health Maintenance Due   Topic   . DTaP/Tdap/Td series (1 - Tdap)   . Shingrix Vaccine Age 53> (1 of 2)   . Colorectal Cancer Screening Combo    . Flu Vaccine (1)       Wt Readings from Last 3 Encounters:   12/10/18 173 lb (78.5 kg)   11/12/18 171 lb (77.6 kg)   11/07/18 170 lb 9.6 oz (77.4 kg)     Temp Readings from Last 3 Encounters:   12/10/18 97.8 F (36.6 C) (Oral)   11/12/18 98 F (36.7 C) (Oral)   11/07/18 97.2 F (36.2 C) (Oral)     BP Readings from Last 3 Encounters:   12/10/18 (!) 150/85   11/12/18 128/80   11/07/18 115/73     Pulse Readings from Last 3 Encounters:   12/10/18 81   11/12/18 86   11/07/18 72         Learning Assessment:  :     Learning Assessment 01/13/2017   PRIMARY LEARNER Patient   PRIMARY LANGUAGE SPANISH   LEARNER PREFERENCE PRIMARY DEMONSTRATION   ANSWERED BY patient   RELATIONSHIP SELF       Depression Screening:  :     3 most recent PHQ Screens 01/24/2019   Little interest or pleasure in doing things Not at all   Feeling down, depressed, irritable, or hopeless Not at all   Total Score PHQ 2 0   Trouble falling or staying asleep, or sleeping too much -   Feeling tired or having little energy -   Poor appetite, weight loss, or overeating -   Feeling bad about yourself - or that you are a failure or have let yourself or your family down -   Trouble concentrating on things such as school, work, reading, or watching TV -   Moving or speaking so slowly that other people could have noticed; or the opposite being so fidgety that others notice -   Thoughts of being better off dead, or hurting yourself in some way -   PHQ 9 Score -   How difficult have these problems made it for you to do your work, take care of your home and get along with others -       Fall Risk Assessment:  :      No flowsheet data found.    Abuse Screening:  :     Abuse Screening Questionnaire 01/13/2017   Do you ever feel afraid of your partner? N   Are you in a relationship with someone who physically or mentally threatens you? N   Is it safe for you to go home? Y       Coordination of Care Questionnaire:  :     1) Have you been to an emergency room, urgent care clinic since your last visit? Yes [facility] 11 days ago  Covid    Hospitalized since your last visit?   No           2) Have you seen or consulted any other health care providers outside of [facility] since your last visit? See above  (Include any pap smears or colon screenings  in this section.)    Patient is accompanied by VV I have received verbal consent from Hadassah JONETTA Stallion to discuss any/all medical information while they are present in the room.

## 2019-04-24 NOTE — Telephone Encounter (Signed)
Reason for Disposition  ??? Requesting regular office appointment    Answer Assessment - Initial Assessment Questions  1. REASON FOR CALL or QUESTION: "What is your reason for calling today?" or "How can I best help you?" or "What question do you have that I can help answer?"      Caller wants to schedule physical.  Warm transfer to Tammy at [facility] to schedule.    Protocols used: INFORMATION ONLY CALL - NO TRIAGE-ADULT-AH

## 2019-04-24 NOTE — Telephone Encounter (Signed)
-----   Message from Maryann Conners sent at 04/24/2019 11:58 AM EDT -----  Regarding: Dr. Ladona Mow General Message/Vendor Calls    Caller's first and last name:      Reason for call: Requesting call back concerning having allergies.        Call back required yes/no and why: Yes      Best contact number(s): [phone]      Details to clarify the request:      Tammy Jori Moll

## 2019-05-22 ENCOUNTER — Ambulatory Visit: Admit: 2019-05-22 | Payer: BLUE CROSS/BLUE SHIELD | Attending: Family | Primary: Family Medicine

## 2019-05-22 ENCOUNTER — Ambulatory Visit: Attending: Family | Primary: Family

## 2019-05-22 DIAGNOSIS — Z0000 Encounter for general adult medical examination without abnormal findings: Secondary | ICD-10-CM

## 2019-05-22 MED ORDER — HYDROCHLOROTHIAZIDE 25 MG TAB
25 mg | ORAL_TABLET | Freq: Every day | ORAL | 1 refills | Status: DC
Start: 2019-05-22 — End: 2019-07-18

## 2019-05-22 MED ORDER — DICLOFENAC 1 % TOPICAL GEL
1 % | CUTANEOUS | 1 refills | Status: DC
Start: 2019-05-22 — End: 2020-10-05

## 2019-05-22 MED ORDER — LEVOTHYROXINE 112 MCG TAB
112 mcg | ORAL_TABLET | Freq: Every day | ORAL | 1 refills | Status: DC
Start: 2019-05-22 — End: 2020-01-20

## 2019-05-22 NOTE — Progress Notes (Addendum)
Assessment/Plan:     Diagnoses and all orders for this visit:    1. Well woman exam (no gynecological exam)  -     URINALYSIS W/ RFLX MICROSCOPIC; Future    2. Essential hypertension  -     METABOLIC PANEL, COMPREHENSIVE; Future  -     MICROALBUMIN, UR, RAND W/ MICROALB/CREAT RATIO; Future  -     hydroCHLOROthiazide (HYDRODIURIL) 25 mg tablet; Take 1 Tab by mouth daily.  - Stable, labs today, refill today    3. Bunion of great toe    4. Acute pain of right knee  -     REFERRAL TO ORTHOPEDICS  - Referral to orthopedics for further evaluation    5. Screening for colon cancer  -     REFERRAL TO GASTROENTEROLOGY    6. Pain in both feet  -     diclofenac (VOLTAREN) 1 % gel; Apply 1 g to affected area four times a day  -     REFERRAL TO PODIATRY  - Worsening, ref to podiatry for further evaluation, voltaren gel to area as directed    7. Impaired glucose tolerance    8. Primary hypothyroidism  -     LIPID PANEL; Future  -     TSH 3RD GENERATION; Future  -     levothyroxine (SYNTHROID) 112 mcg tablet; Take 1 Tab by mouth Daily (before breakfast).  - Presumed stable, labs today    9. Prediabetes  -     HEMOGLOBIN A1C WITH EAG; Future  -     CBC WITH AUTOMATED DIFF; Future  -     LIPID PANEL; Future  - Presumed stable, labs today    10. Vitamin D deficiency  -     VITAMIN D, 25 HYDROXY; Future  -     Presumed stable, labs today            Discussed expected course/resolution/complications of diagnosis in detail with patient.    Medication risks/benefits/costs/interactions/alternatives discussed with patient.    Pt was given after visit summary which includes diagnoses, current medications & vitals.   Pt expressed understanding with the diagnosis and plan        Subjective:      Jacqueline Mckinney is a 54 y.o. female who presents for had concerns including Complete Physical (For x1 mos; experienced left arm pain, bilateral leg pain ) and Labs.     Here today for CPE.  Mammogram up to date.  Pap smear due in 2023.  Unsure when  colonoscopy due.  Sees dentist every 6 months.      Complaining of bilateral foot pain related to bunions on both small toes. Referral to podiatrist requested.     Bilateral knee pain started 1 month ago and is a dull aching pain. 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 Denies taking tylenol or ibuprofen to relieve pain.    Hypertension  Patient is here for follow-up of hypertension.  She indicates that she is feeling well and denies any symptoms referable to her hypertension. She is exercising and is adherent to low salt diet.  Blood pressure is well controlled at home. Use of agents associated with hypertension: none.     Current Outpatient Medications   Medication Sig Dispense Refill   ??? loratadine (CLARITIN) 10 mg tablet TAKE 1 TABLET BY MOUTH ONCE DAILY     ??? diclofenac (VOLTAREN) 1 % gel Apply 1 g to affected area four times a  day 1 Each 1   ??? levothyroxine (SYNTHROID) 112 mcg tablet Take 1 Tab by mouth Daily (before breakfast). 90 Tab 1   ??? hydroCHLOROthiazide (HYDRODIURIL) 25 mg tablet Take 1 Tab by mouth daily. 90 Tab 1   ??? Nebulizer Accessories kit Use as needed for shortness of breath 1 Kit 0   ??? Nebulizer & Compressor machine Use as needed for shortness of breath 1 Each 0   ??? albuterol (PROVENTIL HFA, VENTOLIN HFA, PROAIR HFA) 90 mcg/actuation inhaler Take 1 Puff by inhalation every four (4) hours as needed for Wheezing or Shortness of Breath. 1 Inhaler 5   ??? albuterol (PROVENTIL VENTOLIN) 2.5 mg /3 mL (0.083 %) nebu 1.5 mL by Nebulization route every four (4) hours as needed for Shortness of Breath. 30 Nebule 5   ??? cholecalciferol (VITAMIN D3) (1000 Units /25 mcg) tablet Take 2 Tabs by mouth daily. 180 Tab 1   ??? cholecalciferol (VITAMIN D3) (50,000 UNITS /1250 MCG) capsule Take 1 Cap by mouth every seven (7) days. 8 Cap 0       No Known Allergies  Past Medical History:   Diagnosis Date   ??? H/O seasonal allergies    ??? Hyperlipidemia    ??? Hypertension    ??? Hypothyroid    ???  Impaired glucose tolerance      Past Surgical History:   Procedure Laterality Date   ??? 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 HX TONSIL AND ADENOIDECTOMY     ??? PR EGD DELIVER THERMAL ENERGY SPHNCTR/CARDIA GERD       Family History   Problem Relation Age of Onset   ??? Diabetes Mother      Social History     Socioeconomic History   ??? Marital status: SINGLE     Spouse name: Not on file   ??? Number of children: Not on file   ??? Years of education: Not on file   ??? Highest education level: Not on file   Occupational History   ??? Not on file   Tobacco Use   ??? Smoking status: Never Smoker   ??? Smokeless tobacco: Never Used   Vaping Use   ??? Vaping Use: Never used   Substance and Sexual Activity   ??? Alcohol use: No   ??? Drug use: No   ??? Sexual activity: Never   Other Topics Concern   ??? Not on file   Social History Narrative   ??? Not on file     Social Determinants of Health     Financial Resource Strain:    ??? Difficulty of Paying Living Expenses:    Food Insecurity:    ??? Worried About Charity fundraiser in the Last Year:    ??? Arboriculturist in the Last Year:    Transportation Needs:    ??? Film/video editor (Medical):    ??? Lack of Transportation (Non-Medical):    Physical Activity:    ??? Days of Exercise per Week:    ??? Minutes of Exercise per Session:    Stress:    ??? Feeling of Stress :    Social Connections:    ??? Frequency of Communication with Friends and Family:    ??? Frequency of Social Gatherings with Friends and Family:    ??? Attends Religious Services:    ??? Marine scientist or Organizations:    ??? Attends Club or  Organization Meetings:    ??? Marital Status:    Intimate Partner Violence:    ??? Fear of Current or Ex-Partner:    ??? Emotionally Abused:    ??? Physically Abused:    ??? Sexually Abused:        HPI      ROS:   Review of Systems   Constitutional: Negative for chills, fever and weight loss.   Eyes: Negative for blurred vision.   Respiratory: Negative for cough.    Cardiovascular:  Negative for chest pain and palpitations.   Gastrointestinal: Negative for constipation, nausea and vomiting.   Genitourinary: Negative for dysuria.   Musculoskeletal: Positive for joint pain.   Skin: Negative for rash.   Neurological: Negative for dizziness and headaches.   Psychiatric/Behavioral: Negative for substance abuse and suicidal ideas. The patient does not have insomnia.        Objective:     Visit Vitals  BP 130/83 (BP 1 Location: Right upper arm, BP Patient Position: Sitting, BP Cuff Size: Adult long)   Pulse 78   Temp 97.9 ??F (36.6 ??C) (Temporal)   Resp 16   Ht _0  (1.702 m)   Wt 177 lb 14.4 oz (80.7 kg)   SpO2 98%   BMI 27.86 kg/m??         Vitals and Nurse Documentation reviewed.     Physical Exam  Constitutional:       General: She is not in acute distress.     Appearance: Normal appearance. She is not diaphoretic.   HENT:      Head: Normocephalic and atraumatic.      Right Ear: Tympanic membrane normal.      Left Ear: Tympanic membrane normal.      Nose: Nose normal.      Mouth/Throat:      Dentition: Normal dentition.   Eyes:      General:         Right eye: No discharge.         Left eye: No discharge.      Extraocular Movements: EOM normal.      Conjunctiva/sclera:      Right eye: Right conjunctiva is not injected.      Left eye: Left conjunctiva is not injected.      Pupils: Pupils are equal, round, and reactive to light.   Neck:      Thyroid: No thyroid mass or thyromegaly.      Vascular: No carotid bruit.   Cardiovascular:      Rate and Rhythm: Normal rate and regular rhythm.      Pulses: Normal pulses.           Dorsalis pedis pulses are 2+ on the right side and 2+ on the left side.        Posterior tibial pulses are 2+ on the right side and 2+ on the left side.      Heart sounds: S1 normal and S2 normal. No murmur heard.   No friction rub. No gallop.    Pulmonary:      Effort: Pulmonary effort is normal.      Breath sounds: Normal breath sounds.   Abdominal:      General: Bowel sounds are  normal. There is no distension.      Palpations: There is no mass.      Tenderness: There is no abdominal tenderness.   Musculoskeletal:         General: Normal range of motion.  Cervical back: Neck supple.      Right knee: No swelling or effusion. Normal range of motion. No tenderness. No patellar tendon tenderness. Normal alignment and normal meniscus.      Left knee: No swelling or erythema. Normal range of motion. No tenderness.      Comments: Bilateral knee  Normal flexion and extension  No weakness  No effusion or swelling, no erythema.  Normal patella tracking  Negative anterior and posterior drawer test  negative McMurray test  Not able to perform Ober test    Crepitus present bilateral knees   Lymphadenopathy:      Cervical: No cervical adenopathy.   Skin:     General: Skin is warm, dry and intact.      Findings: No rash.   Neurological:      Mental Status: She is alert.      Sensory: Sensation is intact.      Gait: Gait is intact. Gait normal.   Psychiatric:         Mood and Affect: Mood normal.         Results for orders placed or performed in visit on 05/22/19   TSH 3RD GENERATION   Result Value Ref Range    TSH 0.73 0.36 - 3.74 uIU/mL   VITAMIN D, 25 HYDROXY   Result Value Ref Range    Vitamin D 25-Hydroxy 16.9 (L) 30 - 379 ng/mL   METABOLIC PANEL, COMPREHENSIVE   Result Value Ref Range    Sodium 137 136 - 145 mmol/L    Potassium 4.0 3.5 - 5.1 mmol/L    Chloride 103 97 - 108 mmol/L    CO2 29 21 - 32 mmol/L    Anion gap 5 5 - 15 mmol/L    Glucose 88 65 - 100 mg/dL    BUN 18 6 - 20 MG/DL    Creatinine 0.56 0.55 - 1.02 MG/DL    BUN/Creatinine ratio 32 (H) 12 - 20      GFR est AA >60 >60 ml/min/1.44m    GFR est non-AA >60 >60 ml/min/1.786m   Calcium 9.5 8.5 - 10.1 MG/DL    Bilirubin, total 0.8 0.2 - 1.0 MG/DL    ALT (SGPT) 38 12 - 78 U/L    AST (SGOT) 30 15 - 37 U/L    Alk. phosphatase 134 (H) 45 - 117 U/L    Protein, total 8.0 6.4 - 8.2 g/dL    Albumin 4.4 3.5 - 5.0 g/dL    Globulin 3.6 2.0 - 4.0  g/dL    A-G Ratio 1.2 1.1 - 2.2     LIPID PANEL   Result Value Ref Range    LIPID PROFILE          Cholesterol, total 215 (H) <200 MG/DL    Triglyceride 203 (H) <150 MG/DL    HDL Cholesterol 55 MG/DL    LDL, calculated 119.4 (H) 0 - 100 MG/DL    VLDL, calculated 40.6 MG/DL    CHOL/HDL Ratio 3.9 0.0 - 5.0     CBC WITH AUTOMATED DIFF   Result Value Ref Range    WBC 4.6 3.6 - 11.0 K/uL    RBC 5.22 (H) 3.80 - 5.20 M/uL    HGB 12.9 11.5 - 16.0 g/dL    HCT 43.0 35.0 - 47.0 %    MCV 82.4 80.0 - 99.0 FL    MCH 24.7 (L) 26.0 - 34.0 PG    MCHC 30.0 30.0 - 36.5 g/dL  RDW 12.6 11.5 - 14.5 %    PLATELET 233 150 - 400 K/uL    MPV 12.5 8.9 - 12.9 FL    NRBC 0.0 0 PER 100 WBC    ABSOLUTE NRBC 0.00 0.00 - 0.01 K/uL    NEUTROPHILS 49 32 - 75 %    LYMPHOCYTES 31 12 - 49 %    MONOCYTES 10 5 - 13 %    EOSINOPHILS 9 (H) 0 - 7 %    BASOPHILS 1 0 - 1 %    IMMATURE GRANULOCYTES 0 0.0 - 0.5 %    ABS. NEUTROPHILS 2.3 1.8 - 8.0 K/UL    ABS. LYMPHOCYTES 1.4 0.8 - 3.5 K/UL    ABS. MONOCYTES 0.5 0.0 - 1.0 K/UL    ABS. EOSINOPHILS 0.4 0.0 - 0.4 K/UL    ABS. BASOPHILS 0.1 0.0 - 0.1 K/UL    ABS. IMM. GRANS. 0.0 0.00 - 0.04 K/UL    DF AUTOMATED     URINALYSIS W/ RFLX MICROSCOPIC   Result Value Ref Range    Color YELLOW/STRAW      Appearance CLEAR CLEAR      Specific gravity 1.019 1.003 - 1.030      pH (UA) 5.0 5.0 - 8.0      Protein Negative Negative mg/dL    Glucose Negative Negative mg/dL    Ketone Negative Negative mg/dL    Bilirubin Negative Negative      Blood Negative Negative      Urobilinogen 0.2 0.2 - 1.0 EU/dL    Nitrites Negative Negative      Leukocyte Esterase Negative Negative     MICROALBUMIN, UR, RAND W/ MICROALB/CREAT RATIO   Result Value Ref Range    Microalbumin,urine random 0.78 MG/DL    Creatinine, urine 87.60 mg/dL    Microalbumin/Creat ratio (mg/g creat) 9 0 - 30 mg/g   HEMOGLOBIN A1C WITH EAG   Result Value Ref Range    Hemoglobin A1c 5.7 (H) 4.0 - 5.6 %    Est. average glucose 117 mg/dL

## 2019-05-22 NOTE — Progress Notes (Signed)
Identified pt with two pt identifiers(name and DOB). Reviewed record in preparation for visit and have obtained necessary documentation.  Chief Complaint   Patient presents with   ??? Complete Physical     For x1 mos; experienced left arm pain, bilateral leg pain    ??? Labs        Health Maintenance Due   Topic   ??? DTaP/Tdap/Td series (1 - Tdap)   ??? Shingrix Vaccine Age 53> (1 of 2)   ??? Colorectal Cancer Screening Combo        Coordination of Care Questionnaire:  :   1) Have you been to an emergency room, urgent care, or hospitalized since your last visit?  If yes, where when, and reason for visit? no      2. Have seen or consulted any other health care provider since your last visit?   If yes, where when, and reason for visit?  no        Patient is accompanied by self I have received verbal consent from Jacqueline Mckinney to discuss any/all medical information while they are present in the room.

## 2019-05-22 NOTE — Progress Notes (Signed)
Assessment/Plan:     Diagnoses and all orders for this visit:    1. Well woman exam (no gynecological exam)  -     URINALYSIS W/ RFLX MICROSCOPIC; Future    2. Essential hypertension  -     METABOLIC PANEL, COMPREHENSIVE; Future  -     MICROALBUMIN, UR, RAND W/ MICROALB/CREAT RATIO; Future  -     hydroCHLOROthiazide (HYDRODIURIL) 25 mg tablet; Take 1 Tab by mouth daily.  - Stable, labs today, refill today    3. Bunion of great toe    4. Acute pain of right knee  -     REFERRAL TO ORTHOPEDICS  - Referral to orthopedics for further evaluation    5. Screening for colon cancer  -     REFERRAL TO GASTROENTEROLOGY    6. Pain in both feet  -     diclofenac (VOLTAREN) 1 % gel; Apply 1 g to affected area four times a day  -     REFERRAL TO PODIATRY  - Worsening, ref to podiatry for further evaluation, voltaren gel to area as directed    7. Impaired glucose tolerance    8. Primary hypothyroidism  -     LIPID PANEL; Future  -     TSH 3RD GENERATION; Future  -     levothyroxine (SYNTHROID) 112 mcg tablet; Take 1 Tab by mouth Daily (before breakfast).  - Presumed stable, labs today    9. Prediabetes  -     HEMOGLOBIN A1C WITH EAG; Future  -     CBC WITH AUTOMATED DIFF; Future  -     LIPID PANEL; Future  - Presumed stable, labs today    10. Vitamin D deficiency  -     VITAMIN D, 25 HYDROXY; Future  -     Presumed stable, labs today            Discussed expected course/resolution/complications of diagnosis in detail with patient.    Medication risks/benefits/costs/interactions/alternatives discussed with patient.    Pt was given after visit summary which includes diagnoses, current medications & vitals.   Pt expressed understanding with the diagnosis and plan        Subjective:      Jacqueline Mckinney is a 54 y.o. female who presents for had concerns including Complete Physical (For x1 mos; experienced left arm pain, bilateral leg pain ) and Labs.     Here today for CPE.  Mammogram up to date.  Pap smear due in 2023.  Unsure when  colonoscopy due.  Sees dentist every 6 months.      Complaining of bilateral foot pain related to bunions on both small toes. Referral to podiatrist requested.     Bilateral knee pain started 1 month ago and is a dull aching pain. 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 Denies taking tylenol or ibuprofen to relieve pain.    Hypertension  Patient is here for follow-up of hypertension.  She indicates that she is feeling well and denies any symptoms referable to her hypertension. She is exercising and is adherent to low salt diet.  Blood pressure is well controlled at home. Use of agents associated with hypertension: none.     Current Outpatient Medications   Medication Sig Dispense Refill   ??? loratadine (CLARITIN) 10 mg tablet TAKE 1 TABLET BY MOUTH ONCE DAILY     ??? diclofenac (VOLTAREN) 1 % gel Apply 1 g to affected area four times a  day 1 Each 1   ??? levothyroxine (SYNTHROID) 112 mcg tablet Take 1 Tab by mouth Daily (before breakfast). 90 Tab 1   ??? hydroCHLOROthiazide (HYDRODIURIL) 25 mg tablet Take 1 Tab by mouth daily. 90 Tab 1   ??? Nebulizer Accessories kit Use as needed for shortness of breath 1 Kit 0   ??? Nebulizer & Compressor machine Use as needed for shortness of breath 1 Each 0   ??? albuterol (PROVENTIL HFA, VENTOLIN HFA, PROAIR HFA) 90 mcg/actuation inhaler Take 1 Puff by inhalation every four (4) hours as needed for Wheezing or Shortness of Breath. 1 Inhaler 5   ??? albuterol (PROVENTIL VENTOLIN) 2.5 mg /3 mL (0.083 %) nebu 1.5 mL by Nebulization route every four (4) hours as needed for Shortness of Breath. 30 Nebule 5   ??? cholecalciferol (VITAMIN D3) (1000 Units /25 mcg) tablet Take 2 Tabs by mouth daily. 180 Tab 1   ??? cholecalciferol (VITAMIN D3) (50,000 UNITS /1250 MCG) capsule Take 1 Cap by mouth every seven (7) days. 8 Cap 0       No Known Allergies  Past Medical History:   Diagnosis Date   ??? H/O seasonal allergies    ??? Hyperlipidemia    ??? Hypertension    ??? Hypothyroid    ???  Impaired glucose tolerance      Past Surgical History:   Procedure Laterality Date   ??? 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 HX TONSIL AND ADENOIDECTOMY     ??? PR EGD DELIVER THERMAL ENERGY SPHNCTR/CARDIA GERD       Family History   Problem Relation Age of Onset   ??? Diabetes Mother      Social History     Socioeconomic History   ??? Marital status: SINGLE     Spouse name: Not on file   ??? Number of children: Not on file   ??? Years of education: Not on file   ??? Highest education level: Not on file   Occupational History   ??? Not on file   Tobacco Use   ??? Smoking status: Never Smoker   ??? Smokeless tobacco: Never Used   Vaping Use   ??? Vaping Use: Never used   Substance and Sexual Activity   ??? Alcohol use: No   ??? Drug use: No   ??? Sexual activity: Never   Other Topics Concern   ??? Not on file   Social History Narrative   ??? Not on file     Social Determinants of Health     Financial Resource Strain:    ??? Difficulty of Paying Living Expenses:    Food Insecurity:    ??? Worried About Charity fundraiser in the Last Year:    ??? Arboriculturist in the Last Year:    Transportation Needs:    ??? Film/video editor (Medical):    ??? Lack of Transportation (Non-Medical):    Physical Activity:    ??? Days of Exercise per Week:    ??? Minutes of Exercise per Session:    Stress:    ??? Feeling of Stress :    Social Connections:    ??? Frequency of Communication with Friends and Family:    ??? Frequency of Social Gatherings with Friends and Family:    ??? Attends Religious Services:    ??? Marine scientist or Organizations:    ??? Attends Club or  Organization Meetings:    ??? Marital Status:    Intimate Partner Violence:    ??? Fear of Current or Ex-Partner:    ??? Emotionally Abused:    ??? Physically Abused:    ??? Sexually Abused:        HPI      ROS:   Review of Systems   Constitutional: Negative for chills, fever and weight loss.   Eyes: Negative for blurred vision.   Respiratory: Negative for cough.    Cardiovascular:  Negative for chest pain and palpitations.   Gastrointestinal: Negative for constipation, nausea and vomiting.   Genitourinary: Negative for dysuria.   Musculoskeletal: Positive for joint pain.   Skin: Negative for rash.   Neurological: Negative for dizziness and headaches.   Psychiatric/Behavioral: Negative for substance abuse and suicidal ideas. The patient does not have insomnia.        Objective:     Visit Vitals  BP 130/83 (BP 1 Location: Right upper arm, BP Patient Position: Sitting, BP Cuff Size: Adult long)   Pulse 78   Temp 97.9 ??F (36.6 ??C) (Temporal)   Resp 16   Ht _0  (1.702 m)   Wt 177 lb 14.4 oz (80.7 kg)   SpO2 98%   BMI 27.86 kg/m??         Vitals and Nurse Documentation reviewed.     Physical Exam  Constitutional:       General: She is not in acute distress.     Appearance: Normal appearance. She is not diaphoretic.   HENT:      Head: Normocephalic and atraumatic.      Right Ear: Tympanic membrane normal.      Left Ear: Tympanic membrane normal.      Nose: Nose normal.      Mouth/Throat:      Dentition: Normal dentition.   Eyes:      General:         Right eye: No discharge.         Left eye: No discharge.      Extraocular Movements: EOM normal.      Conjunctiva/sclera:      Right eye: Right conjunctiva is not injected.      Left eye: Left conjunctiva is not injected.      Pupils: Pupils are equal, round, and reactive to light.   Neck:      Thyroid: No thyroid mass or thyromegaly.      Vascular: No carotid bruit.   Cardiovascular:      Rate and Rhythm: Normal rate and regular rhythm.      Pulses: Normal pulses.           Dorsalis pedis pulses are 2+ on the right side and 2+ on the left side.        Posterior tibial pulses are 2+ on the right side and 2+ on the left side.      Heart sounds: S1 normal and S2 normal. No murmur heard.   No friction rub. No gallop.    Pulmonary:      Effort: Pulmonary effort is normal.      Breath sounds: Normal breath sounds.   Abdominal:      General: Bowel sounds are  normal. There is no distension.      Palpations: There is no mass.      Tenderness: There is no abdominal tenderness.   Musculoskeletal:         General: Normal range of motion.  Cervical back: Neck supple.      Right knee: No swelling or effusion. Normal range of motion. No tenderness. No patellar tendon tenderness. Normal alignment and normal meniscus.      Left knee: No swelling or erythema. Normal range of motion. No tenderness.      Comments: Bilateral knee  Normal flexion and extension  No weakness  No effusion or swelling, no erythema.  Normal patella tracking  Negative anterior and posterior drawer test  negative McMurray test  Not able to perform Ober test    Crepitus present bilateral knees   Lymphadenopathy:      Cervical: No cervical adenopathy.   Skin:     General: Skin is warm, dry and intact.      Findings: No rash.   Neurological:      Mental Status: She is alert.      Sensory: Sensation is intact.      Gait: Gait is intact. Gait normal.   Psychiatric:         Mood and Affect: Mood normal.         Results for orders placed or performed in visit on 05/22/19   TSH 3RD GENERATION   Result Value Ref Range    TSH 0.73 0.36 - 3.74 uIU/mL   VITAMIN D, 25 HYDROXY   Result Value Ref Range    Vitamin D 25-Hydroxy 16.9 (L) 30 - 379 ng/mL   METABOLIC PANEL, COMPREHENSIVE   Result Value Ref Range    Sodium 137 136 - 145 mmol/L    Potassium 4.0 3.5 - 5.1 mmol/L    Chloride 103 97 - 108 mmol/L    CO2 29 21 - 32 mmol/L    Anion gap 5 5 - 15 mmol/L    Glucose 88 65 - 100 mg/dL    BUN 18 6 - 20 MG/DL    Creatinine 0.56 0.55 - 1.02 MG/DL    BUN/Creatinine ratio 32 (H) 12 - 20      GFR est AA >60 >60 ml/min/1.44m    GFR est non-AA >60 >60 ml/min/1.786m   Calcium 9.5 8.5 - 10.1 MG/DL    Bilirubin, total 0.8 0.2 - 1.0 MG/DL    ALT (SGPT) 38 12 - 78 U/L    AST (SGOT) 30 15 - 37 U/L    Alk. phosphatase 134 (H) 45 - 117 U/L    Protein, total 8.0 6.4 - 8.2 g/dL    Albumin 4.4 3.5 - 5.0 g/dL    Globulin 3.6 2.0 - 4.0  g/dL    A-G Ratio 1.2 1.1 - 2.2     LIPID PANEL   Result Value Ref Range    LIPID PROFILE          Cholesterol, total 215 (H) <200 MG/DL    Triglyceride 203 (H) <150 MG/DL    HDL Cholesterol 55 MG/DL    LDL, calculated 119.4 (H) 0 - 100 MG/DL    VLDL, calculated 40.6 MG/DL    CHOL/HDL Ratio 3.9 0.0 - 5.0     CBC WITH AUTOMATED DIFF   Result Value Ref Range    WBC 4.6 3.6 - 11.0 K/uL    RBC 5.22 (H) 3.80 - 5.20 M/uL    HGB 12.9 11.5 - 16.0 g/dL    HCT 43.0 35.0 - 47.0 %    MCV 82.4 80.0 - 99.0 FL    MCH 24.7 (L) 26.0 - 34.0 PG    MCHC 30.0 30.0 - 36.5 g/dL  RDW 12.6 11.5 - 14.5 %    PLATELET 233 150 - 400 K/uL    MPV 12.5 8.9 - 12.9 FL    NRBC 0.0 0 PER 100 WBC    ABSOLUTE NRBC 0.00 0.00 - 0.01 K/uL    NEUTROPHILS 49 32 - 75 %    LYMPHOCYTES 31 12 - 49 %    MONOCYTES 10 5 - 13 %    EOSINOPHILS 9 (H) 0 - 7 %    BASOPHILS 1 0 - 1 %    IMMATURE GRANULOCYTES 0 0.0 - 0.5 %    ABS. NEUTROPHILS 2.3 1.8 - 8.0 K/UL    ABS. LYMPHOCYTES 1.4 0.8 - 3.5 K/UL    ABS. MONOCYTES 0.5 0.0 - 1.0 K/UL    ABS. EOSINOPHILS 0.4 0.0 - 0.4 K/UL    ABS. BASOPHILS 0.1 0.0 - 0.1 K/UL    ABS. IMM. GRANS. 0.0 0.00 - 0.04 K/UL    DF AUTOMATED     URINALYSIS W/ RFLX MICROSCOPIC   Result Value Ref Range    Color YELLOW/STRAW      Appearance CLEAR CLEAR      Specific gravity 1.019 1.003 - 1.030      pH (UA) 5.0 5.0 - 8.0      Protein Negative Negative mg/dL    Glucose Negative Negative mg/dL    Ketone Negative Negative mg/dL    Bilirubin Negative Negative      Blood Negative Negative      Urobilinogen 0.2 0.2 - 1.0 EU/dL    Nitrites Negative Negative      Leukocyte Esterase Negative Negative     MICROALBUMIN, UR, RAND W/ MICROALB/CREAT RATIO   Result Value Ref Range    Microalbumin,urine random 0.78 MG/DL    Creatinine, urine 87.60 mg/dL    Microalbumin/Creat ratio (mg/g creat) 9 0 - 30 mg/g   HEMOGLOBIN A1C WITH EAG   Result Value Ref Range    Hemoglobin A1c 5.7 (H) 4.0 - 5.6 %    Est. average glucose 117 mg/dL

## 2019-05-22 NOTE — Progress Notes (Signed)
 Identified pt with two pt identifiers(name and DOB). Reviewed record in preparation for visit and have obtained necessary documentation.  Chief Complaint   Patient presents with   . Complete Physical     For x1 mos; experienced left arm pain, bilateral leg pain    . Labs        Health Maintenance Due   Topic   . DTaP/Tdap/Td series (1 - Tdap)   . Shingrix Vaccine Age 53> (1 of 2)   . Colorectal Cancer Screening Combo        Coordination of Care Questionnaire:  :   1) Have you been to an emergency room, urgent care, or hospitalized since your last visit?  If yes, where when, and reason for visit? no      2. Have seen or consulted any other health care provider since your last visit?   If yes, where when, and reason for visit?  no        Patient is accompanied by self I have received verbal consent from Jacqueline Mckinney to discuss any/all medical information while they are present in the room.

## 2019-05-23 LAB — CBC WITH AUTOMATED DIFF
ABS. BASOPHILS: 0.1 10*3/uL (ref 0.0–0.1)
ABS. EOSINOPHILS: 0.4 10*3/uL (ref 0.0–0.4)
ABS. IMM. GRANS.: 0 10*3/uL (ref 0.00–0.04)
ABS. LYMPHOCYTES: 1.4 10*3/uL (ref 0.8–3.5)
ABS. MONOCYTES: 0.5 10*3/uL (ref 0.0–1.0)
ABS. NEUTROPHILS: 2.3 10*3/uL (ref 1.8–8.0)
ABSOLUTE NRBC: 0 10*3/uL (ref 0.00–0.01)
BASOPHILS: 1 % (ref 0–1)
EOSINOPHILS: 9 % — ABNORMAL HIGH (ref 0–7)
HCT: 43 % (ref 35.0–47.0)
HGB: 12.9 g/dL (ref 11.5–16.0)
IMMATURE GRANULOCYTES: 0 % (ref 0.0–0.5)
LYMPHOCYTES: 31 % (ref 12–49)
MCH: 24.7 PG — ABNORMAL LOW (ref 26.0–34.0)
MCHC: 30 g/dL (ref 30.0–36.5)
MCV: 82.4 FL (ref 80.0–99.0)
MONOCYTES: 10 % (ref 5–13)
MPV: 12.5 FL (ref 8.9–12.9)
NEUTROPHILS: 49 % (ref 32–75)
NRBC: 0 PER 100 WBC
PLATELET: 233 10*3/uL (ref 150–400)
RBC: 5.22 M/uL — ABNORMAL HIGH (ref 3.80–5.20)
RDW: 12.6 % (ref 11.5–14.5)
WBC: 4.6 10*3/uL (ref 3.6–11.0)

## 2019-05-23 LAB — URINALYSIS W/ RFLX MICROSCOPIC
Bilirubin, Urine: NEGATIVE
Bilirubin: NEGATIVE
Blood, Urine: NEGATIVE
Blood: NEGATIVE
Glucose, Ur: NEGATIVE mg/dL
Glucose: NEGATIVE mg/dL
Ketone: NEGATIVE mg/dL
Ketones, Urine: NEGATIVE mg/dL
Leukocyte Esterase, Urine: NEGATIVE
Leukocyte Esterase: NEGATIVE
Nitrite, Urine: NEGATIVE
Nitrites: NEGATIVE
Protein, UA: NEGATIVE mg/dL
Protein: NEGATIVE mg/dL
Specific Gravity, UA: 1.019 (ref 1.003–1.030)
Specific gravity: 1.019 (ref 1.003–1.030)
Urobilinogen, UA, POCT: 0.2 EU/dL (ref 0.2–1.0)
Urobilinogen: 0.2 EU/dL (ref 0.2–1.0)
pH (UA): 5 (ref 5.0–8.0)
pH, UA: 5 (ref 5.0–8.0)

## 2019-05-23 LAB — METABOLIC PANEL, COMPREHENSIVE
A-G Ratio: 1.2 (ref 1.1–2.2)
ALT (SGPT): 38 U/L (ref 12–78)
AST (SGOT): 30 U/L (ref 15–37)
Albumin: 4.4 g/dL (ref 3.5–5.0)
Alk. phosphatase: 134 U/L — ABNORMAL HIGH (ref 45–117)
Anion gap: 5 mmol/L (ref 5–15)
BUN/Creatinine ratio: 32 — ABNORMAL HIGH (ref 12–20)
BUN: 18 MG/DL (ref 6–20)
Bilirubin, total: 0.8 MG/DL (ref 0.2–1.0)
CO2: 29 mmol/L (ref 21–32)
Calcium: 9.5 MG/DL (ref 8.5–10.1)
Chloride: 103 mmol/L (ref 97–108)
Creatinine: 0.56 MG/DL (ref 0.55–1.02)
GFR est AA: >60 mL/min/{1.73_m2} (ref >60)
GFR est non-AA: >60 mL/min/{1.73_m2} (ref >60)
Globulin: 3.6 g/dL (ref 2.0–4.0)
Glucose: 88 mg/dL (ref 65–100)
Potassium: 4 mmol/L (ref 3.5–5.1)
Protein, total: 8 g/dL (ref 6.4–8.2)
Sodium: 137 mmol/L (ref 136–145)

## 2019-05-23 LAB — LIPID PANEL
CHOL/HDL Ratio: 3.9 (ref 0.0–5.0)
Chol/HDL Ratio: 3.9 (ref 0.0–5.0)
Cholesterol, Total: 215 MG/DL — ABNORMAL HIGH (ref <200)
Cholesterol, total: 215 MG/DL — ABNORMAL HIGH (ref <200)
HDL Cholesterol: 55 MG/DL
HDL: 55 MG/DL
LDL Calculated: 119.4 MG/DL — ABNORMAL HIGH (ref 0–100)
LDL, calculated: 119.4 MG/DL — ABNORMAL HIGH (ref 0–100)
Triglyceride: 203 MG/DL — ABNORMAL HIGH (ref <150)
Triglycerides: 203 MG/DL — ABNORMAL HIGH (ref <150)
VLDL Cholesterol Calculated: 40.6 MG/DL
VLDL, calculated: 40.6 MG/DL

## 2019-05-23 LAB — TSH 3RD GENERATION
TSH: 0.73 u[IU]/mL (ref 0.36–3.74)
TSH: 0.73 u[IU]/mL (ref 0.36–3.74)

## 2019-05-23 LAB — VITAMIN D, 25 HYDROXY: Vitamin D 25-Hydroxy: 16.9 ng/mL — ABNORMAL LOW (ref 30–100)

## 2019-05-23 LAB — MICROALBUMIN, UR, RAND W/ MICROALB/CREAT RATIO
Creatinine, urine random: 87.6 mg/dL
Microalbumin,urine random: 0.78 MG/DL
Microalbumin/Creat ratio (mg/g creat): 9 mg/g (ref 0–30)

## 2019-05-23 LAB — HEMOGLOBIN A1C WITH EAG
Est. average glucose: 117 mg/dL
Hemoglobin A1c: 5.7 % — ABNORMAL HIGH (ref 4.0–5.6)

## 2019-05-23 LAB — COMPREHENSIVE METABOLIC PANEL
ALT: 38 U/L (ref 12–78)
AST: 30 U/L (ref 15–37)
Albumin/Globulin Ratio: 1.2 (ref 1.1–2.2)
Albumin: 4.4 g/dL (ref 3.5–5.0)
Alkaline Phosphatase: 134 U/L — ABNORMAL HIGH (ref 45–117)
Anion Gap: 5 mmol/L (ref 5–15)
BUN: 18 MG/DL (ref 6–20)
Bun/Cre Ratio: 32 — ABNORMAL HIGH (ref 12–20)
CO2: 29 mmol/L (ref 21–32)
Calcium: 9.5 MG/DL (ref 8.5–10.1)
Chloride: 103 mmol/L (ref 97–108)
Creatinine: 0.56 MG/DL (ref 0.55–1.02)
EGFR IF NonAfrican American: >60 mL/min/{1.73_m2} (ref >60)
GFR African American: >60 mL/min/{1.73_m2} (ref >60)
Globulin: 3.6 g/dL (ref 2.0–4.0)
Glucose: 88 mg/dL (ref 65–100)
Potassium: 4 mmol/L (ref 3.5–5.1)
Sodium: 137 mmol/L (ref 136–145)
Total Bilirubin: 0.8 MG/DL (ref 0.2–1.0)
Total Protein: 8 g/dL (ref 6.4–8.2)

## 2019-05-23 LAB — CBC WITH AUTO DIFFERENTIAL
Basophils %: 1 % (ref 0–1)
Basophils Absolute: 0.1 10*3/uL (ref 0.0–0.1)
Eosinophils %: 9 % — ABNORMAL HIGH (ref 0–7)
Eosinophils Absolute: 0.4 10*3/uL (ref 0.0–0.4)
Granulocyte Absolute Count: 0 10*3/uL (ref 0.00–0.04)
Hematocrit: 43 % (ref 35.0–47.0)
Hemoglobin: 12.9 g/dL (ref 11.5–16.0)
Immature Granulocytes: 0 % (ref 0.0–0.5)
Lymphocytes %: 31 % (ref 12–49)
Lymphocytes Absolute: 1.4 10*3/uL (ref 0.8–3.5)
MCH: 24.7 PG — ABNORMAL LOW (ref 26.0–34.0)
MCHC: 30 g/dL (ref 30.0–36.5)
MCV: 82.4 FL (ref 80.0–99.0)
MPV: 12.5 FL (ref 8.9–12.9)
Monocytes %: 10 % (ref 5–13)
Monocytes Absolute: 0.5 10*3/uL (ref 0.0–1.0)
NRBC Absolute: 0 10*3/uL (ref 0.00–0.01)
Neutrophils %: 49 % (ref 32–75)
Neutrophils Absolute: 2.3 10*3/uL (ref 1.8–8.0)
Nucleated RBCs: 0 PER 100 WBC
Platelets: 233 10*3/uL (ref 150–400)
RBC: 5.22 M/uL — ABNORMAL HIGH (ref 3.80–5.20)
RDW: 12.6 % (ref 11.5–14.5)
WBC: 4.6 10*3/uL (ref 3.6–11.0)

## 2019-05-23 LAB — HEMOGLOBIN A1C W/EAG
Hemoglobin A1C: 5.7 % — ABNORMAL HIGH (ref 4.0–5.6)
eAG: 117 mg/dL

## 2019-05-23 LAB — MICROALBUMIN / CREATININE URINE RATIO
Creatinine, Ur: 87.6 mg/dL
Microalb, Ur: 0.78 MG/DL
Microalbumin Creatinine Ratio: 9 mg/g (ref 0–30)

## 2019-05-23 LAB — VITAMIN D 25 HYDROXY: Vit D, 25-Hydroxy: 16.9 ng/mL — ABNORMAL LOW (ref 30–100)

## 2019-06-05 NOTE — Telephone Encounter (Signed)
-----   Message from Margaret Budkiewicz sent at 06/05/2019  1:35 PM EDT -----  Regarding: NP Combs/Telephone  Contact: [phone]  General Message/Vendor Calls    Caller's first and last name: Pt.       Reason for call: Has been waiting 5 days for call back to discuss CPE. Has not heard back.       Callback required yes/no and why: Yes, needs to speak with someone in office.       Best contact number(s): [phone]      Details to clarify the request: N/a.      Margaret Budkiewicz

## 2019-06-05 NOTE — Telephone Encounter (Signed)
-----   Message from Park Breed sent at 06/05/2019  1:35 PM EDT -----  Regarding: NP Combs/Telephone  Contact: [phone]  General Message/Vendor Calls    Caller's first and last name: Pt.       Reason for call: Has been waiting 5 days for call back to discuss CPE. Has not heard back.       Callback required yes/no and why: Yes, needs to speak with someone in office.       Best contact number(s): [phone]      Details to clarify the request: N/a.      Park Breed

## 2019-06-06 NOTE — Telephone Encounter (Signed)
Called patient, left verbal message to give office a call back.

## 2019-06-06 NOTE — Telephone Encounter (Signed)
Called patient, left verbal message to give office a call back.

## 2019-06-24 LAB — AMB EXT HGBA1C
Hemoglobin A1C, External: 5.9 %
Hemoglobin A1c, External: 5.9 %

## 2019-07-16 ENCOUNTER — Encounter

## 2019-07-18 ENCOUNTER — Encounter

## 2019-07-18 MED ORDER — HYDROCHLOROTHIAZIDE 25 MG TAB
25 mg | ORAL_TABLET | ORAL | 1 refills | Status: DC
Start: 2019-07-18 — End: 2019-07-22

## 2019-07-22 MED ORDER — HYDROCHLOROTHIAZIDE 25 MG TAB
25 mg | ORAL_TABLET | ORAL | 0 refills | Status: DC
Start: 2019-07-22 — End: 2020-04-17

## 2019-10-04 NOTE — Telephone Encounter (Signed)
Pt states that she had her bs checked at work it was 130, she just had a pear to eat feeling a little fatigue.  No to her symptoms, looks like she was supposed to f/u few months ago.     Pt states that she has been trying to get in touch with our office and not been able to get through. Pt was transferred to the front desk to be seen first of next week. We have no appointments today

## 2019-10-04 NOTE — Telephone Encounter (Signed)
Pt experiencing  sugar 130 feeling tired. Need to speak with Dr or nurse

## 2019-10-18 ENCOUNTER — Ambulatory Visit: Admit: 2019-10-18 | Payer: BLUE CROSS/BLUE SHIELD | Attending: Family | Primary: Family

## 2019-10-18 ENCOUNTER — Ambulatory Visit: Attending: Family | Primary: Family

## 2019-10-18 DIAGNOSIS — R252 Cramp and spasm: Secondary | ICD-10-CM

## 2019-10-18 LAB — AMB POC URINALYSIS DIP STICK AUTO W/O MICRO
Bilirubin (UA POC): NEGATIVE
Bilirubin, Urine, POC: NEGATIVE
Blood (UA POC): NEGATIVE
Blood (UA POC): NEGATIVE
Glucose (UA POC): NEGATIVE
Glucose, Urine, POC: NEGATIVE
Ketones (UA POC): NEGATIVE
Ketones, Urine, POC: NEGATIVE
Nitrite, Urine, POC: NEGATIVE
Nitrites (UA POC): NEGATIVE
Protein (UA POC): NEGATIVE
Protein, Urine, POC: NEGATIVE
Specific Gravity, Urine, POC: 1.02 NA (ref 1.001–1.035)
Specific gravity (UA POC): 1.02 (ref 1.001–1.035)
Urobilinogen (UA POC): 0.2 (ref 0.2–1)
Urobilinogen, POC: 0.2 (ref 0.2–1)
pH (UA POC): 7.5 (ref 4.6–8.0)
pH, Urine, POC: 7.5 NA (ref 4.6–8.0)

## 2019-10-18 MED ORDER — CYCLOBENZAPRINE 5 MG TAB
5 mg | ORAL_TABLET | Freq: Every evening | ORAL | 1 refills | Status: DC
Start: 2019-10-18 — End: 2020-04-17

## 2019-10-18 MED ORDER — LOPERAMIDE 2 MG TAB
2 mg | ORAL_TABLET | Freq: Four times a day (QID) | ORAL | 0 refills | Status: AC | PRN
Start: 2019-10-18 — End: 2019-10-28

## 2019-10-18 MED ORDER — LORATADINE 10 MG TAB
10 mg | ORAL_TABLET | Freq: Every day | ORAL | 1 refills | Status: DC
Start: 2019-10-18 — End: 2020-04-17

## 2019-10-18 NOTE — Progress Notes (Signed)
Identified pt with two pt identifiers(name and DOB). Reviewed record in preparation for visit and have obtained necessary documentation.  Chief Complaint   Patient presents with   . Abdominal Pain     Discuss sharp pain in abdominal; w/ gas, bloated    . Leg Pain     Discuss bilateral leg pain w/ sharp pain   . Medication Refill        Health Maintenance Due   Topic   . DTaP/Tdap/Td series (1 - Tdap)   . Colorectal Cancer Screening Combo    . Shingrix Vaccine Age 75> (1 of 2)   . Flu Vaccine (1)       Coordination of Care Questionnaire:  :   1) Have you been to an emergency room, urgent care, or hospitalized since your last visit?  If yes, where when, and reason for visit? no      2. Have seen or consulted any other health care provider since your last visit?   If yes, where when, and reason for visit?  no        Patient is accompanied by self I have received verbal consent from Jae Dire to discuss any/all medical information while they are present in the room.

## 2019-10-18 NOTE — Progress Notes (Signed)
Assessment/Plan:     Diagnoses and all orders for this visit:    1. Muscle cramp, nocturnal  -     MAGNESIUM; Future  -     METABOLIC PANEL, BASIC; Future  -     CK; Future  -     SED RATE (ESR); Future  -     C REACTIVE PROTEIN, QT; Future  -     cyclobenzaprine (FLEXERIL) 5 mg tablet; Take 1 Tablet by mouth nightly.  - Worsening, labs today. Advised to take low dose magnesium supplement at night.  Start cyclobenzaprine as directed, side effects discussed.  Follow up based on results    2. Muscle weakness  -     METABOLIC PANEL, BASIC; Future  -     CK; Future  -     SED RATE (ESR); Future  -     C REACTIVE PROTEIN, QT; Future  - As above, if labs normal will send to neurology for further work up    3. Vitamin D deficiency  -     VITAMIN D, 25 HYDROXY; Future  - Presumed stable, labs today    4. Prediabetes  -     HEMOGLOBIN A1C WITH EAG; Future  -     CBC WITH AUTOMATED DIFF; Future  - Presumed stable, labs today    5. Urinary frequency  -     AMB POC URINALYSIS DIP STICK AUTO W/O MICRO  - Normal UA, referred to Uro-GYN. Declines oxybutynin at this time    6. Allergic rhinitis, unspecified seasonality, unspecified trigger  -     loratadine (CLARITIN) 10 mg tablet; Take 1 Tablet by mouth daily.  - Stable, continue current therapy, refill today    7. Frequent stools  -     loperamide (Imodium A-D) 2 mg tablet; Take 1 Tablet by mouth four (4) times daily as needed for Diarrhea for up to 10 days.  - Unchanged, trial of imodium and continue follow up with GI    8. Primary hypothyroidism  -     TSH 3RD GENERATION; Future  - Presumed stable, labs today, referred to [facility] due to hair loss    9. Other urinary incontinence  -     REFERRAL TO UROGYNECOLOGY  -  as above    10. Hair loss  -     REFERRAL TO ENDOCRINOLOGY  -  as above            Discussed expected course/resolution/complications of diagnosis in detail with patient.    Medication risks/benefits/costs/interactions/alternatives discussed with patient.    Pt was  given after visit summary which includes diagnoses, current medications & vitals.   Pt expressed understanding with the diagnosis and plan        Subjective:      Jacqueline Mckinney is a 54 y.o. female who presents for had concerns including Abdominal Pain (Discuss sharp pain in abdominal; w/ gas, bloated ), Leg Pain (Discuss bilateral leg pain w/ sharp pain), and Medication Refill.     Here today for urinary frequency for 6 months.  Denies fevers, flanks pain, dysuria.    Has frequent stools, has seen GI and getting endoscopy and colonoscopy in a week. Denies bloody stools    States neurology tested her Thyroid and it was elevated.  Taking 184mg as directed does not miss a dose     Pain in both calfs, started 6 months ago, when working. Works as hSecretary/administrator  Pain described as cramping  when sleeping and has to get up and stretch.  States    Hair loss for a year.  Hair is all over her pillow.  States one doctor checked her thyroid and it was high.  She does not remember who and we do not have a copy.    Current Outpatient Medications   Medication Sig Dispense Refill   ??? loperamide (Imodium A-D) 2 mg tablet Take 1 Tablet by mouth four (4) times daily as needed for Diarrhea for up to 10 days. 30 Tablet 0   ??? loratadine (CLARITIN) 10 mg tablet Take 1 Tablet by mouth daily. 90 Tablet 1   ??? cyclobenzaprine (FLEXERIL) 5 mg tablet Take 1 Tablet by mouth nightly. 30 Tablet 1   ??? hydroCHLOROthiazide (HYDRODIURIL) 25 mg tablet Take 1 tablet by mouth once daily 90 Tablet 0   ??? diclofenac (VOLTAREN) 1 % gel Apply 1 g to affected area four times a day 1 Each 1   ??? levothyroxine (SYNTHROID) 112 mcg tablet Take 1 Tab by mouth Daily (before breakfast). 90 Tab 1   ??? Nebulizer Accessories kit Use as needed for shortness of breath 1 Kit 0   ??? Nebulizer & Compressor machine Use as needed for shortness of breath 1 Each 0   ??? albuterol (PROVENTIL HFA, VENTOLIN HFA, PROAIR HFA) 90 mcg/actuation inhaler Take 1 Puff by inhalation every four  (4) hours as needed for Wheezing or Shortness of Breath. 1 Inhaler 5   ??? albuterol (PROVENTIL VENTOLIN) 2.5 mg /3 mL (0.083 %) nebu 1.5 mL by Nebulization route every four (4) hours as needed for Shortness of Breath. 30 Nebule 5   ??? cholecalciferol (VITAMIN D3) (1000 Units /25 mcg) tablet Take 2 Tabs by mouth daily. 180 Tab 1   ??? cholecalciferol (VITAMIN D3) (50,000 UNITS /1250 MCG) capsule Take 1 Cap by mouth every seven (7) days. 8 Cap 0       No Known Allergies  Past Medical History:   Diagnosis Date   ??? H/O seasonal allergies    ??? Hyperlipidemia    ??? Hypertension    ??? Hypothyroid    ??? Impaired glucose tolerance      Past Surgical History:   Procedure Laterality Date   ??? 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 HX TONSIL AND ADENOIDECTOMY     ??? PR EGD DELIVER THERMAL ENERGY SPHNCTR/CARDIA GERD       Family History   Problem Relation Age of Onset   ??? Diabetes Mother      Social History     Socioeconomic History   ??? Marital status: SINGLE     Spouse name: Not on file   ??? Number of children: Not on file   ??? Years of education: Not on file   ??? Highest education level: Not on file   Occupational History   ??? Not on file   Tobacco Use   ??? Smoking status: Never Smoker   ??? Smokeless tobacco: Never Used   Vaping Use   ??? Vaping Use: Never used   Substance and Sexual Activity   ??? Alcohol use: No   ??? Drug use: No   ??? Sexual activity: Never   Other Topics Concern   ??? Not on file   Social History Narrative   ??? Not on file     Social Determinants of Health     Financial Resource Strain:    ???  Difficulty of Paying Living Expenses:    Food Insecurity:    ??? Worried About Charity fundraiser in the Last Year:    ??? Arboriculturist in the Last Year:    Transportation Needs:    ??? Film/video editor (Medical):    ??? Lack of Transportation (Non-Medical):    Physical Activity:    ??? Days of Exercise per Week:    ??? Minutes of Exercise per Session:    Stress:    ??? Feeling of Stress :    Social Connections:     ??? Frequency of Communication with Friends and Family:    ??? Frequency of Social Gatherings with Friends and Family:    ??? Attends Religious Services:    ??? Marine scientist or Organizations:    ??? Attends Music therapist:    ??? Marital Status:    Intimate Production manager Violence:    ??? Fear of Current or Ex-Partner:    ??? Emotionally Abused:    ??? Physically Abused:    ??? Sexually Abused:        HPI      ROS:   Review of Systems   Constitutional: Negative for chills, fever and malaise/fatigue.   Eyes: Negative for blurred vision.   Respiratory: Negative for cough and shortness of breath.    Cardiovascular: Negative for chest pain, palpitations and leg swelling.   Gastrointestinal:        Frequent stools   Genitourinary: Positive for frequency. Negative for dysuria, flank pain, hematuria and urgency.   Musculoskeletal: Positive for myalgias.   Skin:        Hair loss   Neurological: Negative for dizziness and headaches.       Objective:     Visit Vitals  BP (!) 148/93 (BP 1 Location: Left upper arm, BP Patient Position: Sitting, BP Cuff Size: Adult long)   Pulse 70   Temp 97.5 ??F (36.4 ??C) (Temporal)   Resp 16   Ht '5\' 7"'  (1.702 m)   Wt 168 lb (76.2 kg)   SpO2 100%   BMI 26.31 kg/m??         Vitals and Nurse Documentation reviewed.     Physical Exam  Constitutional:       General: She is not in acute distress.     Appearance: She is not diaphoretic.   HENT:      Head: Normocephalic and atraumatic.   Cardiovascular:      Rate and Rhythm: Normal rate and regular rhythm.      Heart sounds: Normal heart sounds. No murmur heard.   No friction rub. No gallop.    Pulmonary:      Effort: Pulmonary effort is normal. No respiratory distress.      Breath sounds: Normal breath sounds. No wheezing or rales.   Musculoskeletal:      Right lower leg: No swelling or tenderness. No edema.      Left lower leg: No swelling or tenderness. No edema.      Right ankle: No tenderness. Normal range of motion.      Left ankle: No  tenderness. Normal range of motion.      Comments: homan's sign negative, bilateral, no redness or swelling, no weakness noted.    Skin:     General: Skin is warm and dry.      Coloration: Skin is not pale.      Comments: Notable hair loss   Neurological:  Mental Status: She is alert and oriented to person, place, and time.   Psychiatric:         Mood and Affect: Affect normal. Mood is not anxious or depressed.         Behavior: Behavior is not agitated.         Thought Content: Thought content does not include homicidal or suicidal ideation.         Results for orders placed or performed in visit on 10/18/19   AMB POC URINALYSIS DIP STICK AUTO W/O MICRO   Result Value Ref Range    Color (UA POC) Yellow     Clarity (UA POC) Clear     Glucose (UA POC) Negative Negative    Bilirubin (UA POC) Negative Negative    Ketones (UA POC) Negative Negative    Specific gravity (UA POC) 1.020 1.001 - 1.035    Blood (UA POC) Negative Negative    pH (UA POC) 7.5 4.6 - 8.0    Protein (UA POC) Negative Negative    Urobilinogen (UA POC) 0.2 mg/dL 0.2 - 1    Nitrites (UA POC) Negative Negative    Leukocyte esterase (UA POC) Trace Negative

## 2019-10-19 LAB — SED RATE (ESR): Sed rate, automated: 16 mm/hr (ref 0–30)

## 2019-10-19 LAB — METABOLIC PANEL, BASIC
Anion gap: 4 mmol/L — ABNORMAL LOW (ref 5–15)
BUN/Creatinine ratio: 34 — ABNORMAL HIGH (ref 12–20)
BUN: 23 MG/DL — ABNORMAL HIGH (ref 6–20)
CO2: 31 mmol/L (ref 21–32)
Calcium: 9.4 MG/DL (ref 8.5–10.1)
Chloride: 104 mmol/L (ref 97–108)
Creatinine: 0.67 MG/DL (ref 0.55–1.02)
GFR est AA: >60 mL/min/{1.73_m2} (ref >60)
GFR est non-AA: >60 mL/min/{1.73_m2} (ref >60)
Glucose: 88 mg/dL (ref 65–100)
Potassium: 3.8 mmol/L (ref 3.5–5.1)
Sodium: 139 mmol/L (ref 136–145)

## 2019-10-19 LAB — CBC WITH AUTOMATED DIFF
ABS. BASOPHILS: 0 10*3/uL (ref 0.0–0.1)
ABS. EOSINOPHILS: 0.2 10*3/uL (ref 0.0–0.4)
ABS. IMM. GRANS.: 0 10*3/uL (ref 0.00–0.04)
ABS. LYMPHOCYTES: 1.2 10*3/uL (ref 0.8–3.5)
ABS. MONOCYTES: 0.3 10*3/uL (ref 0.0–1.0)
ABS. NEUTROPHILS: 2.1 10*3/uL (ref 1.8–8.0)
ABSOLUTE NRBC: 0 10*3/uL (ref 0.00–0.01)
BASOPHILS: 1 % (ref 0–1)
EOSINOPHILS: 4 % (ref 0–7)
HCT: 43.8 % (ref 35.0–47.0)
HGB: 13.5 g/dL (ref 11.5–16.0)
IMMATURE GRANULOCYTES: 0 % (ref 0.0–0.5)
LYMPHOCYTES: 31 % (ref 12–49)
MCH: 25 PG — ABNORMAL LOW (ref 26.0–34.0)
MCHC: 30.8 g/dL (ref 30.0–36.5)
MCV: 81.1 FL (ref 80.0–99.0)
MONOCYTES: 7 % (ref 5–13)
NEUTROPHILS: 57 % (ref 32–75)
NRBC: 0 PER 100 WBC
PLATELET: 197 10*3/uL (ref 150–400)
RBC: 5.4 M/uL — ABNORMAL HIGH (ref 3.80–5.20)
RDW: 12.8 % (ref 11.5–14.5)
WBC: 3.8 10*3/uL (ref 3.6–11.0)

## 2019-10-19 LAB — CK
CK: 141 U/L (ref 26–192)
Total CK: 141 U/L (ref 26–192)

## 2019-10-19 LAB — VITAMIN D, 25 HYDROXY: Vitamin D 25-Hydroxy: 14.8 ng/mL — ABNORMAL LOW (ref 30–100)

## 2019-10-19 LAB — HEMOGLOBIN A1C WITH EAG
Est. average glucose: 114 mg/dL
Hemoglobin A1c: 5.6 % (ref 4.0–5.6)

## 2019-10-19 LAB — TSH 3RD GENERATION
TSH: 1.85 u[IU]/mL (ref 0.36–3.74)
TSH: 1.85 u[IU]/mL (ref 0.36–3.74)

## 2019-10-19 LAB — MAGNESIUM
Magnesium: 2.1 mg/dL (ref 1.6–2.4)
Magnesium: 2.1 mg/dL (ref 1.6–2.4)

## 2019-10-19 LAB — C REACTIVE PROTEIN, QT: C-Reactive protein: <0.29 mg/dL (ref 0.00–0.60)

## 2019-10-19 LAB — CBC WITH AUTO DIFFERENTIAL
Basophils %: 1 % (ref 0–1)
Basophils Absolute: 0 10*3/uL (ref 0.0–0.1)
Eosinophils %: 4 % (ref 0–7)
Eosinophils Absolute: 0.2 10*3/uL (ref 0.0–0.4)
Granulocyte Absolute Count: 0 10*3/uL (ref 0.00–0.04)
Hematocrit: 43.8 % (ref 35.0–47.0)
Hemoglobin: 13.5 g/dL (ref 11.5–16.0)
Immature Granulocytes: 0 % (ref 0.0–0.5)
Lymphocytes %: 31 % (ref 12–49)
Lymphocytes Absolute: 1.2 10*3/uL (ref 0.8–3.5)
MCH: 25 PG — ABNORMAL LOW (ref 26.0–34.0)
MCHC: 30.8 g/dL (ref 30.0–36.5)
MCV: 81.1 FL (ref 80.0–99.0)
Monocytes %: 7 % (ref 5–13)
Monocytes Absolute: 0.3 10*3/uL (ref 0.0–1.0)
NRBC Absolute: 0 10*3/uL (ref 0.00–0.01)
Neutrophils %: 57 % (ref 32–75)
Neutrophils Absolute: 2.1 10*3/uL (ref 1.8–8.0)
Nucleated RBCs: 0 PER 100 WBC
Platelets: 197 10*3/uL (ref 150–400)
RBC: 5.4 M/uL — ABNORMAL HIGH (ref 3.80–5.20)
RDW: 12.8 % (ref 11.5–14.5)
WBC: 3.8 10*3/uL (ref 3.6–11.0)

## 2019-10-19 LAB — BASIC METABOLIC PANEL
Anion Gap: 4 mmol/L — ABNORMAL LOW (ref 5–15)
BUN: 23 MG/DL — ABNORMAL HIGH (ref 6–20)
Bun/Cre Ratio: 34 — ABNORMAL HIGH (ref 12–20)
CO2: 31 mmol/L (ref 21–32)
Calcium: 9.4 MG/DL (ref 8.5–10.1)
Chloride: 104 mmol/L (ref 97–108)
Creatinine: 0.67 MG/DL (ref 0.55–1.02)
EGFR IF NonAfrican American: >60 mL/min/{1.73_m2} (ref >60)
GFR African American: >60 mL/min/{1.73_m2} (ref >60)
Glucose: 88 mg/dL (ref 65–100)
Potassium: 3.8 mmol/L (ref 3.5–5.1)
Sodium: 139 mmol/L (ref 136–145)

## 2019-10-19 LAB — HEMOGLOBIN A1C W/EAG
Hemoglobin A1C: 5.6 % (ref 4.0–5.6)
eAG: 114 mg/dL

## 2019-10-19 LAB — VITAMIN D 25 HYDROXY: Vit D, 25-Hydroxy: 14.8 ng/mL — ABNORMAL LOW (ref 30–100)

## 2019-10-19 LAB — SEDIMENTATION RATE: Sed Rate: 16 mm/hr (ref 0–30)

## 2019-10-19 LAB — C-REACTIVE PROTEIN: CRP: <0.29 mg/dL (ref 0.00–0.60)

## 2019-11-01 ENCOUNTER — Encounter

## 2019-11-01 ENCOUNTER — Inpatient Hospital Stay: Admit: 2019-11-01 | Payer: BLUE CROSS/BLUE SHIELD | Primary: Family

## 2019-11-01 DIAGNOSIS — Z01812 Encounter for preprocedural laboratory examination: Secondary | ICD-10-CM

## 2019-11-01 NOTE — Interval H&P Note (Signed)
Patient was contacted and made aware of COVID test requirements, date and location. Given options and times for [facility] and [facility]. Patient upset and hung up on me. She states she wasn't aware of testing requirements prior to today.

## 2019-11-03 LAB — SARS-COV-2: SARS-CoV-2: NOT DETECTED

## 2019-11-03 LAB — COVID-19: SARS-CoV-2: NOT DETECTED

## 2019-11-05 NOTE — Progress Notes (Signed)
 [facility]  Endoscopy Preprocedure Instructions      1. On the day of your procedure, please report to registration located on the 2nd floor of the  the [facility]. yes    2. You must have a responsible adult to drive you to the hospital, stay at the hospital during your procedure and drive you home. If they leave your procedure will not be started (no exceptions). yes    3. Do not have anything to eat or drink (including water, gum, mints, coffee, and juice) after midnight. This does not apply to the medications you were instructed to take by your physician.yes  If you are currently taking Plavix, Coumadin, Aspirin, or other blood-thinning agents, contact your physician for special instructions. yes    4. If you are having a procedure that requires bowel prep: We recommend that you have only clear liquids the day before your procedure and begin your bowel prep by 5:00 pm.  You may continue to drink clear liquids until midnight.  If for any reason you are not able to complete your prep please notify your physician immediately. yes    5. Have a list of all current medications, including vitamins, herbal supplements and any other over the counter medications. yes    6. If you wear glasses, contacts, dentures and/or hearing aids, they may be removed prior to procedure, please bring a case to store them in. yes    7. You should understand that if you do not follow these instructions your procedure may be cancelled.  If your physical condition changes (I.e. fever, cold or flu) please contact your doctor as soon as possible.    8. It is important that you be on time.  If for any reason you are unable to keep your appointment please call [phone] the day of or your physician's office prior to your scheduled procedure    9. Have you received your COVID Vaccine? yes If no, you will need to receive a COVID test/swab here at [facility] the MOB parking lot Monday - Friday 8a - 11am. There are no  Saturday or Sunday swabbing at any [facility] facility. (patient verbalizes understanding) yes  Covid-19 results are negative on file and patient will bring her Covid-19 vaccination card.

## 2019-11-06 ENCOUNTER — Inpatient Hospital Stay: Payer: BLUE CROSS/BLUE SHIELD

## 2019-11-06 LAB — POC H. PYLORI, TISSUE
H pylori from tissue: NEGATIVE
H. pylori from tissue: NEGATIVE
Kit lot no.: 171021 NA
Lot no.: 171021
NEGATIVE CONTROL: NEGATIVE
Negative control: NEGATIVE
POSITIVE CONTROL: POSITIVE
Positive control: POSITIVE

## 2019-11-06 MED ORDER — MIDAZOLAM 1 MG/ML IJ SOLN
1 mg/mL | INTRAMUSCULAR | Status: DC | PRN
Start: 2019-11-06 — End: 2019-11-06

## 2019-11-06 MED ORDER — NALOXONE 0.4 MG/ML INJECTION
0.4 mg/mL | INTRAMUSCULAR | Status: DC | PRN
Start: 2019-11-06 — End: 2019-11-06

## 2019-11-06 MED ORDER — PROPOFOL 10 MG/ML IV EMUL
10 mg/mL | INTRAVENOUS | Status: DC | PRN
Start: 2019-11-06 — End: 2019-11-06
  Administered 2019-11-06: 17:00:00 via INTRAVENOUS

## 2019-11-06 MED ORDER — FENTANYL CITRATE (PF) 50 MCG/ML IJ SOLN
50 mcg/mL | INTRAMUSCULAR | Status: DC | PRN
Start: 2019-11-06 — End: 2019-11-06

## 2019-11-06 MED ORDER — SIMETHICONE 40 MG/0.6 ML ORAL DROPS, SUSP
40 mg/0.6 mL | ORAL | Status: DC | PRN
Start: 2019-11-06 — End: 2019-11-06

## 2019-11-06 MED ORDER — EPINEPHRINE 0.1 MG/ML SYRINGE
0.1 mg/mL | Freq: Once | INTRAMUSCULAR | Status: DC | PRN
Start: 2019-11-06 — End: 2019-11-06

## 2019-11-06 MED ORDER — SODIUM CHLORIDE 0.9 % IV
INTRAVENOUS | Status: DC
Start: 2019-11-06 — End: 2019-11-06
  Administered 2019-11-06: 17:00:00 via INTRAVENOUS

## 2019-11-06 MED ORDER — FLUMAZENIL 0.1 MG/ML IV SOLN
0.1 mg/mL | INTRAVENOUS | Status: DC | PRN
Start: 2019-11-06 — End: 2019-11-06

## 2019-11-06 MED ORDER — ATROPINE 0.1 MG/ML SYRINGE
0.1 mg/mL | Freq: Once | INTRAMUSCULAR | Status: DC | PRN
Start: 2019-11-06 — End: 2019-11-06

## 2019-11-06 MED ORDER — LIDOCAINE (PF) 20 MG/ML (2 %) IJ SOLN
20 mg/mL (2 %) | INTRAMUSCULAR | Status: DC | PRN
Start: 2019-11-06 — End: 2019-11-06
  Administered 2019-11-06: 17:00:00 via INTRAVENOUS

## 2019-11-06 MED FILL — SODIUM CHLORIDE 0.9 % IV: INTRAVENOUS | Qty: 250

## 2019-11-06 NOTE — Interval H&P Note (Signed)
Initial RN admission and assessment performed and documented in Endoscopy navigator.     Patient evaluated by anesthesia in pre-procedure holding.    All procedural vital signs, airway assessment, and level of consciousness information monitored and recorded by anesthesia staff on the anesthesia record.

## 2019-11-06 NOTE — Progress Notes (Signed)
 Endoscopy discharge instructions have been reviewed and given to patient.  The patient verbalized understanding and acceptance of instructions.      Dr. Nedra Hai discussed with patient procedure findings and next steps.

## 2019-11-06 NOTE — Procedures (Signed)
Procedures by Ian Malkin, MD at 11/06/19 1332                Author: Ian Malkin, MD  Service: Gastroenterology  Author Type: Physician       Filed: 11/06/19 1335  Date of Service: 11/06/19 1332  Status: Signed          Editor: Ian Malkin, MD (Physician)            Pre-procedure Diagnoses        1. Upper abdominal pain [R10.10]                           Post-procedure Diagnoses        1. Gastroesophageal reflux disease without esophagitis [K21.9]                           Procedures        1. UPPER GI ENDOSCOPY,BIOPSY [[id number]]                                                          [facility]   [place] [facility]   Ian Malkin M.D.     November 06, 2019      Esophagogastroduodenoscopy (EGD) Procedure Note   Jacqueline Mckinney   DOB: 14-May-1965   Sondra Barges Medical Record Number: [id number]         Indications:    Abdominal pain, epigastric   Referring Physician:  [name], MD   Anesthesia/Sedation: see nursing notes   Endoscopist:  Dr. Ian Malkin   Assistants: None   Permit:   The indications, risks, benefits and alternatives were reviewed with the patient or their decision maker who was provided an opportunity to ask questions and all questions were answered.  The specific  risks of esophagogastroduodenoscopy with conscious sedation were reviewed, including but not limited to anesthetic complication, bleeding, adverse drug reaction, missed lesion, infection, IV site reactions, and intestinal perforation which would lead  to the need for surgical repair.  Alternatives to EGD including radiographic imaging, observation without testing, or laboratory testing were reviewed as well as the limitations of those alternatives discussed.  After considering the options and having  all their questions answered, the patient or their decision maker provided both verbal and written consent to proceed.         Procedure in Detail:   After obtaining informed consent,  positioning of the patient in the left lateral decubitus position, and conduction of a pre-procedure pause or "time out" the endoscope was introduced into the mouth  and advanced to the duodenum.  A careful inspection was made, and findings or interventions are described below.      Findings:    Esophagus:normal   Stomach: less than one cm hiatal hernia and no mucosal lesion appreciated   Duodenum/jejunum: normal      Complications/estimated blood loss: none      Therapies:  biopsy of esophagus   Clo test biopsy      Specimens: CLO test and EG junction biopsies      Implants:none               Recommendations:   -daily Linzess 290 mcg  Change omeprazole to 40 mg daily pantoprazole; if treatment successful retain medications permanently   If treatment not successful follow-up in office in 8 weeks   Thank you for entrusting me with this patient's care.  Please do not hesitate to contact me with any questions or if I can be of assistance with any of your other patients' GI needs.   Signed By: Ian Malkin, MD                         November 06, 2019

## 2019-11-06 NOTE — H&P (Signed)
Patient Name: Jacqueline Mckinney. Decoster   11/06/2019   Gender: Female   DOB (age): 30-Aug-1965 (53)           Referring Physician:     Marlane Hatcher  [address] [place], [place] [address]  [phone] (phone)  [phone] (fax)     Micah Noel  [address], [place], [place] [address]  [phone] (phone)  [phone] (fax)        Chief Complaint: abdominal pain, diarrhea, blood in stool           History of Present Illness:       Has 2 sets of complaints. First is epigastric burning provoked by meals associated with regurgitation, excessive burping. Second days of meal provoked lower abdominal cramps, urgent bowel movements, bright red blood and rectal bleeding. Has not had fever, melena, hematemesis.    Has had blood testing obtained; was advised of an abnormal finding.? ?        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/2003  Tonsillectomy   Dx Studies: Colonoscopy, 12/07/2016, Normal mucosa in the appendiceal orifice, Normal mucosa in the terminal ileum and Normal mucosa in the rectum and whole colon  EGD, 06/08/2012   Medications: hydrochlorothiazide 25 mg Take 1 tablet by mouth once a day as directed  levothyroxine 112 mcg Take 1 tablet by mouth once a day as directed   Allergies: Patient has no known allergies or drug allergies   Immunizations: Influenza vaccination, 01/09/2019  Influenza, seasonal, injectable, 01/08/2018  COVID Vaccine, 01/12/2019; 02/02/2019      Social History      Alcohol: None   Tobacco: Never smoker   Drugs: None   Exercise: Exercise 3 or more times a week. Walking/ Running 2 times a week.   Caffeine: Weekly.     Family History No history of Colon Cancer, Colon Polyps, Esophogeal Cancer, GI Cancers, IBD (Crohn's or UC)        Review of Systems:   Cardiovascular: Presents suffers from  chest pain. Denies irregular heart beat, palpitations, peripheral edema, syncope, Sweats.   Constitutional: Presents suffers from fatigue, weight gain. Denies fever, loss of appetite, weight loss.   ENMT: Denies nose bleeds, sore throat, hearing loss.   Endocrine: Denies excessive thirst, heat intolerance.   Eyes: Denies loss of vision.   Gastrointestinal: Presents suffers from abdominal pain. Denies abdominal swelling, change in bowel habits, constipation, diarrhea, Bloating/gas, heartburn, jaundice, nausea, rectal bleeding, stomach cramps, vomiting, dysphagia, rectal pain, Stool incontinence, hematemesis.   Genitourinary: Denies dark urine, dysuria, frequent urination, hematuria, incontinence.   Hematologic/Lymphatic: Denies easy bruising, prolonged bleeding.   Integumentary: Denies itching, rashes, sun sensitivity.   Musculoskeletal: Denies arthritis, back pain, gout, joint pain, muscle weakness, stiffness.   Neurological: Denies dizziness, fainting, frequent headaches, memory loss.   Psychiatric: Denies anxiety, depression, difficulty sleeping, hallucinations, nervousness, panic attacks, paranoia.   Respiratory: Denies cough, dyspnea, wheezing.      Vital Signs: see RN notes   Physical Exam:   Constitutional:      Appearance: No distress, appears comfortable.   Skin:      Inspection: No rash, no jaundice.   Head/face:      Inspection: Normacephalic, atraumatic.   Eyes:      Conjunctivae/lids: Normal.  ENMT:      External: Normal.   Neck:      Neck: Normal appearance, trachea midline.   Respiratory:      Effort: Normal respiratory effort, comfortable, speaks in complete sentences.   Auscultation: normal breath sounds, no rubs, wheezes or rhonchi.   Cardiovascular:      Auscultation: normal, S1 and S2, no gallops , no rubs or murmurs .   Gastrointestinal/Abdomen:      Abdomen: non-distended, nontender.   Liver/Spleen: normal, normal size, Liver size and consistency normal, spleen is non-palpable.    Musculoskeletal:      Gait/station: normal.   Muscles: normal strength and tone, no atrophy or abnormal movements.   Psychiatric:      Judgment/insight: Normal, normal judgement, normal insight.   Mood and affect: Normal mood, affect full, no evidence of depression, anxiety or agitation.   Lymphatic:      Neck: No lymphadenopathy in the cervical or supraclavicular chain.      Impressions: Epigastric pain  Change in bowel habit  Lower abdominal pain, unspecified  Rectal hemorrhage? ?      Plan: I have discussed EGD biopsy, dilation, alternatives complications including but not limited to pain, cardiopulmonary event, bleeding, perforation; all questions answered.

## 2019-11-06 NOTE — Interval H&P Note (Signed)
Received report from Jacqueline Hillock, CRNA. See anesthesia record. Patient taken to post-recovery. Post-recovery report given to Wannetta Sender, RN.   Patient's ABD remains soft and non-tender post procedure. Pt has no complaints at this time and tolerated the procedure well.  Endoscope was pre-cleaned at bedside immediately following procedure by Rosalita Chessman.

## 2019-11-06 NOTE — Progress Notes (Signed)
JAYLEAH GARBERS  [birth year]  [id number]    Situation:  Verbal report received from: Johnetta RN   Procedure: Procedure(s):  ESOPHAGOGASTRODUODENOSCOPY (EGD)  ESOPHAGOGASTRODUODENAL (EGD) BIOPSY    Background:    Preoperative diagnosis: Epigastric pain [R10.13]  Postoperative diagnosis: 1-     Operator:  Dr. Nedra Hai  Assistant(s): Endoscopy RN-1: Shelby Dubin    Specimens:   ID Type Source Tests Collected by Time Destination   1 : EG Junction Biopsy Preservative   Ian Malkin, MD 11/06/2019 1327 Pathology     H. Pylori  yes    Assessment:    Anesthesia gave intra-procedure sedation and medications, see anesthesia flow sheet no    Intravenous fluids: NS@ KVO     Vital signs stable     Abdominal assessment: round and soft     Recommendation:  Discharge patient per MD order.  Return to floor  Family or Friend   Permission to share finding with family or friend yes

## 2019-11-06 NOTE — Anesthesia Pre-Procedure Evaluation (Signed)
Relevant Problems   CARDIOVASCULAR   (+) Essential hypertension      ENDOCRINE   (+) Primary hypothyroidism       Anesthetic History   No history of anesthetic complications            Review of Systems / Medical History  Patient summary reviewed, nursing notes reviewed and pertinent labs reviewed    Pulmonary            Asthma (after covid)        Neuro/Psych   Within defined limits           Cardiovascular    Hypertension              Exercise tolerance: >4 METS     GI/Hepatic/Renal  Within defined limits              Endo/Other      Hypothyroidism       Other Findings   Comments: Covid 01/2019  Sinus congestion            Physical Exam    Airway  Mallampati: I  TM Distance: 4 - 6 cm  Neck ROM: normal range of motion   Mouth opening: Normal     Cardiovascular  Regular rate and rhythm,  S1 and S2 normal,  no murmur, click, rub, or gallop  Rhythm: regular  Rate: normal         Dental  No notable dental hx       Pulmonary  Breath sounds clear to auscultation               Abdominal  GI exam deferred       Other Findings            Anesthetic Plan    ASA: 2  Anesthesia type: MAC          Induction: Intravenous  Anesthetic plan and risks discussed with: Patient

## 2019-11-06 NOTE — Anesthesia Post-Procedure Evaluation (Signed)
Procedure(s):  ESOPHAGOGASTRODUODENOSCOPY (EGD)  ESOPHAGOGASTRODUODENAL (EGD) BIOPSY.    MAC    Anesthesia Post Evaluation        Patient location during evaluation: PACU  Patient participation: complete - patient participated  Level of consciousness: awake  Pain management: adequate  Airway patency: patent  Anesthetic complications: no  Cardiovascular status: acceptable  Respiratory status: acceptable  Hydration status: acceptable  Post anesthesia nausea and vomiting:  none  Final Post Anesthesia Temperature Assessment:  Normothermia (36.0-37.5 degrees C)      INITIAL Post-op Vital signs:   Vitals Value Taken Time   BP 149/95 11/06/19 1348   Temp 36.6 ??C (97.8 ??F) 11/06/19 1333   Pulse 64 11/06/19 1353   Resp 16 11/06/19 1353   SpO2 97 % 11/06/19 1353   Vitals shown include unvalidated device data.

## 2019-11-27 ENCOUNTER — Other Ambulatory Visit (HOSPITAL_COMMUNITY): Payer: Self-pay | Admitting: Family Medicine

## 2019-11-27 DIAGNOSIS — Z1231 Encounter for screening mammogram for malignant neoplasm of breast: Secondary | ICD-10-CM

## 2019-11-29 ENCOUNTER — Other Ambulatory Visit: Payer: Self-pay

## 2019-11-29 ENCOUNTER — Ambulatory Visit (HOSPITAL_COMMUNITY)
Admission: RE | Admit: 2019-11-29 | Discharge: 2019-11-29 | Disposition: A | Discharge status: Home / Self Care | Payer: BC Managed Care – PPO | Source: Ambulatory Visit | Attending: Family Medicine | Admitting: Family Medicine

## 2019-11-29 DIAGNOSIS — Z1231 Encounter for screening mammogram for malignant neoplasm of breast: Secondary | ICD-10-CM | POA: Diagnosis not present

## 2020-01-20 ENCOUNTER — Encounter

## 2020-01-20 MED ORDER — EUTHYROX 112 MCG TABLET
112 mcg | ORAL_TABLET | ORAL | 1 refills | Status: DC
Start: 2020-01-20 — End: 2020-04-17

## 2020-04-17 ENCOUNTER — Ambulatory Visit: Admit: 2020-04-17 | Payer: BLUE CROSS/BLUE SHIELD | Attending: Family | Primary: Family

## 2020-04-17 ENCOUNTER — Ambulatory Visit: Attending: Family | Primary: Family

## 2020-04-17 DIAGNOSIS — Z0000 Encounter for general adult medical examination without abnormal findings: Secondary | ICD-10-CM

## 2020-04-17 LAB — VITAMIN D, 25 HYDROXY: Vitamin D 25-Hydroxy: 13 ng/mL — ABNORMAL LOW (ref 30–100)

## 2020-04-17 LAB — URINALYSIS W/MICROSCOPIC
Bacteria: NEGATIVE /hpf
Bilirubin: NEGATIVE
Blood: NEGATIVE
Glucose: NEGATIVE mg/dL
Ketone: NEGATIVE mg/dL
Leukocyte Esterase: NEGATIVE
Nitrites: NEGATIVE
Protein: NEGATIVE mg/dL
Specific gravity: 1.014 (ref 1.003–1.030)
Urobilinogen: 0.2 EU/dL (ref 0.2–1.0)
pH (UA): 7.5 (ref 5.0–8.0)

## 2020-04-17 LAB — CBC WITH AUTOMATED DIFF
ABS. BASOPHILS: 0.1 10*3/uL (ref 0.0–0.1)
ABS. EOSINOPHILS: 0.3 10*3/uL (ref 0.0–0.4)
ABS. IMM. GRANS.: 0 10*3/uL (ref 0.00–0.04)
ABS. LYMPHOCYTES: 1.2 10*3/uL (ref 0.8–3.5)
ABS. MONOCYTES: 0.4 10*3/uL (ref 0.0–1.0)
ABS. NEUTROPHILS: 3 10*3/uL (ref 1.8–8.0)
ABSOLUTE NRBC: 0 10*3/uL (ref 0.00–0.01)
BASOPHILS: 1 % (ref 0–1)
EOSINOPHILS: 7 % (ref 0–7)
HCT: 42.6 % (ref 35.0–47.0)
HGB: 13.1 g/dL (ref 11.5–16.0)
IMMATURE GRANULOCYTES: 0 % (ref 0.0–0.5)
LYMPHOCYTES: 23 % (ref 12–49)
MCH: 25.9 PG — ABNORMAL LOW (ref 26.0–34.0)
MCHC: 30.8 g/dL (ref 30.0–36.5)
MCV: 84.2 FL (ref 80.0–99.0)
MONOCYTES: 8 % (ref 5–13)
MPV: 12.3 FL (ref 8.9–12.9)
NEUTROPHILS: 61 % (ref 32–75)
NRBC: 0 PER 100 WBC
PLATELET: 274 10*3/uL (ref 150–400)
RBC: 5.06 M/uL (ref 3.80–5.20)
RDW: 12.5 % (ref 11.5–14.5)
WBC: 4.9 10*3/uL (ref 3.6–11.0)

## 2020-04-17 LAB — HEMOGLOBIN A1C WITH EAG
Est. average glucose: 117 mg/dL
Hemoglobin A1c: 5.7 % — ABNORMAL HIGH (ref 4.0–5.6)

## 2020-04-17 LAB — METABOLIC PANEL, COMPREHENSIVE
A-G Ratio: 1.1 (ref 1.1–2.2)
ALT (SGPT): 68 U/L (ref 12–78)
AST (SGOT): 46 U/L — ABNORMAL HIGH (ref 15–37)
Albumin: 4.4 g/dL (ref 3.5–5.0)
Alk. phosphatase: 104 U/L (ref 45–117)
Anion gap: 2 mmol/L — ABNORMAL LOW (ref 5–15)
BUN/Creatinine ratio: 25 — ABNORMAL HIGH (ref 12–20)
BUN: 18 MG/DL (ref 6–20)
Bilirubin, total: 1.5 MG/DL — ABNORMAL HIGH (ref 0.2–1.0)
CO2: 32 mmol/L (ref 21–32)
Calcium: 9.7 MG/DL (ref 8.5–10.1)
Chloride: 102 mmol/L (ref 97–108)
Creatinine: 0.72 MG/DL (ref 0.55–1.02)
GFR est AA: >60 mL/min/{1.73_m2} (ref >60)
GFR est non-AA: >60 mL/min/{1.73_m2} (ref >60)
Globulin: 4 g/dL (ref 2.0–4.0)
Glucose: 85 mg/dL (ref 65–100)
Potassium: 4.1 mmol/L (ref 3.5–5.1)
Protein, total: 8.4 g/dL — ABNORMAL HIGH (ref 6.4–8.2)
Sodium: 136 mmol/L (ref 136–145)

## 2020-04-17 LAB — LIPID PANEL
CHOL/HDL Ratio: 5.1 — ABNORMAL HIGH (ref 0.0–5.0)
Chol/HDL Ratio: 5.1 — ABNORMAL HIGH (ref 0.0–5.0)
Cholesterol, Total: 302 MG/DL — ABNORMAL HIGH (ref <200)
Cholesterol, total: 302 MG/DL — ABNORMAL HIGH (ref <200)
HDL Cholesterol: 59 MG/DL
HDL: 59 MG/DL
LDL Calculated: 206.6 MG/DL — ABNORMAL HIGH (ref 0–100)
LDL, calculated: 206.6 MG/DL — ABNORMAL HIGH (ref 0–100)
Triglyceride: 182 MG/DL — ABNORMAL HIGH (ref <150)
Triglycerides: 182 MG/DL — ABNORMAL HIGH (ref <150)
VLDL Cholesterol Calculated: 36.4 MG/DL
VLDL, calculated: 36.4 MG/DL

## 2020-04-17 LAB — MICROALBUMIN, UR, RAND W/ MICROALB/CREAT RATIO
Creatinine, urine random: 71.4 mg/dL
Microalbumin,urine random: 0.69 MG/DL
Microalbumin/Creat ratio (mg/g creat): 10 mg/g (ref 0–30)

## 2020-04-17 LAB — TSH 3RD GENERATION
TSH: 15.2 u[IU]/mL — ABNORMAL HIGH (ref 0.36–3.74)
TSH: 15.2 u[IU]/mL — ABNORMAL HIGH (ref 0.36–3.74)

## 2020-04-17 LAB — CBC WITH AUTO DIFFERENTIAL
Basophils %: 1 % (ref 0–1)
Basophils Absolute: 0.1 10*3/uL (ref 0.0–0.1)
Eosinophils %: 7 % (ref 0–7)
Eosinophils Absolute: 0.3 10*3/uL (ref 0.0–0.4)
Granulocyte Absolute Count: 0 10*3/uL (ref 0.00–0.04)
Hematocrit: 42.6 % (ref 35.0–47.0)
Hemoglobin: 13.1 g/dL (ref 11.5–16.0)
Immature Granulocytes: 0 % (ref 0.0–0.5)
Lymphocytes %: 23 % (ref 12–49)
Lymphocytes Absolute: 1.2 10*3/uL (ref 0.8–3.5)
MCH: 25.9 PG — ABNORMAL LOW (ref 26.0–34.0)
MCHC: 30.8 g/dL (ref 30.0–36.5)
MCV: 84.2 FL (ref 80.0–99.0)
MPV: 12.3 FL (ref 8.9–12.9)
Monocytes %: 8 % (ref 5–13)
Monocytes Absolute: 0.4 10*3/uL (ref 0.0–1.0)
NRBC Absolute: 0 10*3/uL (ref 0.00–0.01)
Neutrophils %: 61 % (ref 32–75)
Neutrophils Absolute: 3 10*3/uL (ref 1.8–8.0)
Nucleated RBCs: 0 PER 100 WBC
Platelets: 274 10*3/uL (ref 150–400)
RBC: 5.06 M/uL (ref 3.80–5.20)
RDW: 12.5 % (ref 11.5–14.5)
WBC: 4.9 10*3/uL (ref 3.6–11.0)

## 2020-04-17 LAB — URINALYSIS WITH MICROSCOPIC
BACTERIA, URINE: NEGATIVE /hpf
Bilirubin, Urine: NEGATIVE
Blood, Urine: NEGATIVE
Glucose, Ur: NEGATIVE mg/dL
Ketones, Urine: NEGATIVE mg/dL
Leukocyte Esterase, Urine: NEGATIVE
Nitrite, Urine: NEGATIVE
Protein, UA: NEGATIVE mg/dL
Specific Gravity, UA: 1.014 (ref 1.003–1.030)
Urobilinogen, UA, POCT: 0.2 EU/dL (ref 0.2–1.0)
pH, UA: 7.5 (ref 5.0–8.0)

## 2020-04-17 LAB — COMPREHENSIVE METABOLIC PANEL
ALT: 68 U/L (ref 12–78)
AST: 46 U/L — ABNORMAL HIGH (ref 15–37)
Albumin/Globulin Ratio: 1.1 (ref 1.1–2.2)
Albumin: 4.4 g/dL (ref 3.5–5.0)
Alkaline Phosphatase: 104 U/L (ref 45–117)
Anion Gap: 2 mmol/L — ABNORMAL LOW (ref 5–15)
BUN: 18 MG/DL (ref 6–20)
Bun/Cre Ratio: 25 — ABNORMAL HIGH (ref 12–20)
CO2: 32 mmol/L (ref 21–32)
Calcium: 9.7 MG/DL (ref 8.5–10.1)
Chloride: 102 mmol/L (ref 97–108)
Creatinine: 0.72 MG/DL (ref 0.55–1.02)
EGFR IF NonAfrican American: >60 mL/min/{1.73_m2} (ref >60)
GFR African American: >60 mL/min/{1.73_m2} (ref >60)
Globulin: 4 g/dL (ref 2.0–4.0)
Glucose: 85 mg/dL (ref 65–100)
Potassium: 4.1 mmol/L (ref 3.5–5.1)
Sodium: 136 mmol/L (ref 136–145)
Total Bilirubin: 1.5 MG/DL — ABNORMAL HIGH (ref 0.2–1.0)
Total Protein: 8.4 g/dL — ABNORMAL HIGH (ref 6.4–8.2)

## 2020-04-17 LAB — HEMOGLOBIN A1C W/EAG
Hemoglobin A1C: 5.7 % — ABNORMAL HIGH (ref 4.0–5.6)
eAG: 117 mg/dL

## 2020-04-17 LAB — MICROALBUMIN / CREATININE URINE RATIO
Creatinine, Ur: 71.4 mg/dL
Microalb, Ur: 0.69 MG/DL
Microalbumin Creatinine Ratio: 10 mg/g (ref 0–30)

## 2020-04-17 LAB — VITAMIN D 25 HYDROXY: Vit D, 25-Hydroxy: 13 ng/mL — ABNORMAL LOW (ref 30–100)

## 2020-04-17 MED ORDER — LORATADINE 10 MG TAB
10 mg | ORAL_TABLET | Freq: Every day | ORAL | 1 refills | Status: DC
Start: 2020-04-17 — End: 2021-04-21

## 2020-04-17 MED ORDER — FLUTICASONE 50 MCG/ACTUATION NASAL SPRAY, SUSP
50 mcg/actuation | Freq: Every day | NASAL | 2 refills | Status: DC
Start: 2020-04-17 — End: 2020-12-28

## 2020-04-17 MED ORDER — LEVOTHYROXINE 112 MCG TAB
112 mcg | ORAL_TABLET | Freq: Every day | ORAL | 1 refills | Status: DC
Start: 2020-04-17 — End: 2020-12-01

## 2020-04-17 MED ORDER — CYCLOBENZAPRINE 5 MG TAB
5 mg | ORAL_TABLET | Freq: Every evening | ORAL | 1 refills | Status: DC
Start: 2020-04-17 — End: 2020-07-10

## 2020-04-17 MED ORDER — HYDROCHLOROTHIAZIDE 25 MG TAB
25 mg | ORAL_TABLET | Freq: Every day | ORAL | 0 refills | Status: DC
Start: 2020-04-17 — End: 2020-07-30

## 2020-04-17 NOTE — Progress Notes (Signed)
Assessment/Plan:     Diagnoses and all orders for this visit:    1. Annual physical exam  -     MAM MAMMO BI SCREENING INCL CAD; Future  -     URINALYSIS W/MICROSCOPIC; Future  -     CBC WITH AUTOMATED DIFF; Future  - Up to date on preventative screenings. Labs today.  Extensive discussion on seeing specialists when referred.  Patient had already established with this neurologist and advised to make an apt.  Labs today.     2. Encounter for screening mammogram for malignant neoplasm of breast  -     MAM MAMMO BI SCREENING INCL CAD; Future    3. Muscle cramp, nocturnal  -     cyclobenzaprine (FLEXERIL) 5 mg tablet; Take 1 Tablet by mouth nightly.  - Patient has been seen by neurology in November 2021 and MRI was not covered.  Advised she must follow up with neurology and we discussed that she would make an apt.  Refill of cyclobenzaprine and continue magnesium    4. Allergic rhinitis, unspecified seasonality, unspecified trigger  -     fluticasone propionate (FLONASE) 50 mcg/actuation nasal spray; 2 Sprays by Both Nostrils route daily.  -     loratadine (CLARITIN) 10 mg tablet; Take 1 Tablet by mouth daily.  - Worsening, suggested to see allergist, patient declined, refill today    5. Primary hypothyroidism  -     levothyroxine (Euthyrox) 112 mcg tablet; Take 1 Tablet by mouth Daily (before breakfast).  -     TSH 3RD GENERATION; Future  - Presumed stable, labs today, refill today    6. Essential hypertension  -     hydroCHLOROthiazide (HYDRODIURIL) 25 mg tablet; Take 1 Tablet by mouth daily.  -     METABOLIC PANEL, COMPREHENSIVE; Future  -     MICROALBUMIN, UR, RAND W/ MICROALB/CREAT RATIO; Future  - Mild elevation today, check BP at home, refill today and labs today, follow up if home BP is elevated    7. Vitamin D deficiency  -     VITAMIN D, 25 HYDROXY; Future  - Presumed stable, labs today    8. Prediabetes  -     HEMOGLOBIN A1C WITH EAG; Future  - Presumed stable, labs today    9. Screening, lipid  -     LIPID  PANEL; Future  - Presumed stable, labs today    10. Hair loss  -     REFERRAL TO DERMATOLOGY  - Worsening, previous work up unremarkable, referred to endocrinology in the past, patient was never able to see.  Ref to derm today.  Advised patient she must be seen by derm for further evaluation and treatment            Discussed expected course/resolution/complications of diagnosis in detail with patient.    Medication risks/benefits/costs/interactions/alternatives discussed with patient.    Pt was given after visit summary which includes diagnoses, current medications & vitals.   Pt expressed understanding with the diagnosis and plan        Subjective:      Jacqueline Mckinney is a 55 y.o. female who presents for had concerns including Physical and Leg Pain (x1 months; bilateral leg pain ).     Here today for CPE. Mammogram due 06/2020.  Colonoscopy due at age 32.  Sees dentist every 6 months  Vision exam up to date  Pap done in 2020    Bilateral leg pain  For  1 month, "feels very bad"  Legs hurt worse at night" described as cramping. Occurs about once a week.   Saw her for this in October and she started a muscle relaxer and magnesium.  States she has had a couple of unexplained fall.  Had brain tumor surgery in 2005 at [facility].  She states it has not gotten any better.   Has seen neurology and insurance would not cover an MRI  Dr. Iran Ouch    Hair loss, ongoing, has not seen endocrinology as referred to in the past.     Current Outpatient Medications   Medication Sig Dispense Refill   ??? cyclobenzaprine (FLEXERIL) 5 mg tablet Take 1 Tablet by mouth nightly. 30 Tablet 1   ??? fluticasone propionate (FLONASE) 50 mcg/actuation nasal spray 2 Sprays by Both Nostrils route daily. 1 Each 2   ??? levothyroxine (Euthyrox) 112 mcg tablet Take 1 Tablet by mouth Daily (before breakfast). 90 Tablet 1   ??? loratadine (CLARITIN) 10 mg tablet Take 1 Tablet by mouth daily. 90 Tablet 1   ??? hydroCHLOROthiazide (HYDRODIURIL) 25 mg  tablet Take 1 Tablet by mouth daily. 90 Tablet 0   ??? omeprazole (PRILOSEC) 20 mg capsule Take 20 mg by mouth daily.     ??? linaCLOtide (Linzess) 145 mcg cap capsule Take 145 mcg by mouth Daily (before breakfast).     ??? Nebulizer Accessories kit Use as needed for shortness of breath 1 Kit 0   ??? Nebulizer & Compressor machine Use as needed for shortness of breath 1 Each 0   ??? albuterol (PROVENTIL HFA, VENTOLIN HFA, PROAIR HFA) 90 mcg/actuation inhaler Take 1 Puff by inhalation every four (4) hours as needed for Wheezing or Shortness of Breath. 1 Inhaler 5   ??? albuterol (PROVENTIL VENTOLIN) 2.5 mg /3 mL (0.083 %) nebu 1.5 mL by Nebulization route every four (4) hours as needed for Shortness of Breath. 30 Nebule 5   ??? cholecalciferol (VITAMIN D3) (1000 Units /25 mcg) tablet Take 2 Tabs by mouth daily. 180 Tab 1   ??? cholecalciferol (VITAMIN D3) (50,000 UNITS /1250 MCG) capsule Take 1 Cap by mouth every seven (7) days. 8 Cap 0   ??? diclofenac (VOLTAREN) 1 % gel Apply 1 g to affected area four times a day (Patient not taking: Reported on 11/06/2019) 1 Each 1       No Known Allergies  Past Medical History:   Diagnosis Date   ??? H/O seasonal allergies    ??? Hyperlipidemia    ??? Hypertension    ??? Hypothyroid    ??? 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 Impaired glucose tolerance      Past Surgical History:   Procedure Laterality Date   ??? 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 HX TONSIL AND ADENOIDECTOMY     ??? PR EGD DELIVER THERMAL ENERGY SPHNCTR/CARDIA GERD       Family History   Problem Relation Age of Onset   ??? Diabetes Mother      Social History     Socioeconomic History   ??? Marital status: SINGLE     Spouse name: Not on file   ??? Number of children: Not on file   ??? Years of education: Not on file   ??? Highest education level: Not on file   Occupational History   ??? Not on file  Tobacco Use   ??? Smoking status: Never Smoker   ??? Smokeless tobacco: Never  Used   Vaping Use   ??? Vaping Use: Never used   Substance and Sexual Activity   ??? Alcohol use: No   ??? Drug use: No   ??? Sexual activity: Never   Other Topics Concern   ??? Not on file   Social History Narrative   ??? Not on file     Social Determinants of Health     Financial Resource Strain:    ??? Difficulty of Paying Living Expenses: Not on file   Food Insecurity:    ??? Worried About Running Out of Food in the Last Year: Not on file   ??? Ran Out of Food in the Last Year: Not on file   Transportation Needs:    ??? Lack of Transportation (Medical): Not on file   ??? Lack of Transportation (Non-Medical): Not on file   Physical Activity:    ??? Days of Exercise per Week: Not on file   ??? Minutes of Exercise per Session: Not on file   Stress:    ??? Feeling of Stress : Not on file   Social Connections:    ??? Frequency of Communication with Friends and Family: Not on file   ??? Frequency of Social Gatherings with Friends and Family: Not on file   ??? Attends Religious Services: Not on file   ??? Active Member of Clubs or Organizations: Not on file   ??? Attends Archivist Meetings: Not on file   ??? Marital Status: Not on file   Intimate Partner Violence:    ??? Fear of Current or Ex-Partner: Not on file   ??? Emotionally Abused: Not on file   ??? Physically Abused: Not on file   ??? Sexually Abused: Not on file   Housing Stability:    ??? Unable to Pay for Housing in the Last Year: Not on file   ??? Number of Places Lived in the Last Year: Not on file   ??? Unstable Housing in the Last Year: Not on file       HPI      ROS:   Review of Systems   Constitutional: Negative for chills, fever and weight loss.   HENT: Positive for congestion.    Eyes: Negative for blurred vision.   Respiratory: Negative for cough and shortness of breath.    Cardiovascular: Negative for chest pain and palpitations.   Gastrointestinal: Negative for constipation, nausea and vomiting.   Genitourinary: Negative for dysuria.   Musculoskeletal: Positive for myalgias. Negative for  joint pain.   Skin: Negative for rash.   Neurological: Negative for dizziness and headaches.   Endo/Heme/Allergies:        Hair loss   Psychiatric/Behavioral: Negative for substance abuse and suicidal ideas. The patient does not have insomnia.        Objective:     Visit Vitals  BP (!) 132/91 (BP 1 Location: Left upper arm, BP Patient Position: Sitting, BP Cuff Size: Adult)   Pulse 74   Temp 97.6 ??F (36.4 ??C) (Temporal)   Resp 16   Ht '5\' 7"'  (1.702 m)   Wt 170 lb 6.4 oz (77.3 kg)   SpO2 99%   BMI 26.69 kg/m??         Vitals and Nurse Documentation reviewed.     Physical Exam  Constitutional:       Appearance: Normal appearance.   HENT:  Head: Normocephalic and atraumatic.      Right Ear: Tympanic membrane normal.      Left Ear: Tympanic membrane normal.      Nose: Nose normal.      Mouth/Throat:      Dentition: Normal dentition.   Eyes:      General:         Right eye: No discharge.         Left eye: No discharge.      Conjunctiva/sclera:      Right eye: Right conjunctiva is not injected.      Left eye: Left conjunctiva is not injected.      Pupils: Pupils are equal, round, and reactive to light.   Neck:      Thyroid: No thyroid mass or thyromegaly.      Vascular: No carotid bruit.   Cardiovascular:      Rate and Rhythm: Normal rate and regular rhythm.      Pulses:           Dorsalis pedis pulses are 2+ on the right side and 2+ on the left side.        Posterior tibial pulses are 2+ on the right side and 2+ on the left side.      Heart sounds: S1 normal and S2 normal. No murmur heard.  No friction rub. No gallop.    Pulmonary:      Breath sounds: Normal breath sounds.   Abdominal:      General: Bowel sounds are normal. There is no distension.      Palpations: There is no mass.      Tenderness: There is no abdominal tenderness.   Musculoskeletal:         General: Normal range of motion.      Cervical back: Neck supple.   Lymphadenopathy:      Cervical: No cervical adenopathy.   Skin:     General: Skin is warm and  dry.      Findings: No rash.      Comments: Hair loss   Neurological:      Mental Status: She is alert.      Cranial Nerves: Cranial nerves are intact.      Sensory: Sensation is intact.      Motor: Motor function is intact. No weakness.      Coordination: Coordination is intact.      Gait: Gait is intact. Gait normal.      Deep Tendon Reflexes:      Reflex Scores:       Patellar reflexes are 2+ on the right side and 2+ on the left side.  Psychiatric:         Mood and Affect: Mood and affect normal.         Results for orders placed or performed in visit on 11/29/19   AMB EXT HGBA1C   Result Value Ref Range    Hemoglobin A1c, External 5.9 %

## 2020-04-17 NOTE — Progress Notes (Signed)
Please have patient make a follow-up appointment for significantly elevated thyroid lab and high cholesterol and very low vitamin D which could all be contributing to her symptoms

## 2020-04-17 NOTE — Progress Notes (Signed)
Identified pt with two pt identifiers(name and DOB). Reviewed record in preparation for visit and have obtained necessary documentation.  Chief Complaint   Patient presents with   . Physical   . Leg Pain     x1 months; bilateral leg pain         Health Maintenance Due   Topic   . DTaP/Tdap/Td series (1 - Tdap)   . Colorectal Cancer Screening Combo    . Shingrix Vaccine Age 54> (1 of 2)   . COVID-19 Vaccine (3 - Booster for [employer] series)   . Depression Screen        Coordination of Care Questionnaire:  :   1) Have you been to an emergency room, urgent care, or hospitalized since your last visit?  If yes, where when, and reason for visit? no      2. Have seen or consulted any other health care provider since your last visit?   If yes, where when, and reason for visit?  no        Patient is accompanied by self I have received verbal consent from Jae Dire to discuss any/all medical information while they are present in the room.

## 2020-04-23 NOTE — Telephone Encounter (Signed)
-----   Message from Vinnie Langton, NP sent at 04/22/2020 10:31 AM EDT -----  Please have patient make a follow-up appointment for significantly elevated thyroid lab and high cholesterol and very low vitamin D which could all be contributing to her symptoms

## 2020-05-01 ENCOUNTER — Ambulatory Visit: Admit: 2020-05-01 | Payer: BLUE CROSS/BLUE SHIELD | Attending: Family | Primary: Family

## 2020-05-01 ENCOUNTER — Ambulatory Visit: Attending: Family | Primary: Family

## 2020-05-01 DIAGNOSIS — E039 Hypothyroidism, unspecified: Secondary | ICD-10-CM

## 2020-05-01 NOTE — Progress Notes (Signed)
Assessment/Plan:     Diagnoses and all orders for this visit:    1. Primary hypothyroidism  -     TSH 3RD GENERATION; Future  - Presumed improving, take medication as prescribe and do not miss doses, repeat labs in 2-3 months    2. Vitamin D deficiency  -     VITAMIN D, 25 HYDROXY; Future  - Presumed improving, repeat labs in 3 months    3. Elevated LDL cholesterol level  -     LIPID PANEL; Future  - Likely related to high TSH, repeat labs in 3 months.             Discussed expected course/resolution/complications of diagnosis in detail with patient.    Medication risks/benefits/costs/interactions/alternatives discussed with patient.    Pt was given after visit summary which includes diagnoses, current medications & vitals.   Pt expressed understanding with the diagnosis and plan        Subjective:      Jacqueline Mckinney is a 55 y.o. female who presents for had concerns including Labs (Discuss results).     Here today for lab follow up.  States she has not been taking her thyroid medication but now she is taking everyday now. Feels better  Here LDL was over 200.     Vitamin D was 13.     Current Outpatient Medications   Medication Sig Dispense Refill   ??? azelastine (ASTELIN) 137 mcg (0.1 %) nasal spray USE 1 TO 2 SPRAY(S) IN EACH NOSTRIL EVERY 12 HOURS AS NEEDED     ??? cyclobenzaprine (FLEXERIL) 5 mg tablet Take 1 Tablet by mouth nightly. 30 Tablet 1   ??? albuterol (PROVENTIL HFA, VENTOLIN HFA, PROAIR HFA) 90 mcg/actuation inhaler Take 1 Puff by inhalation every four (4) hours as needed for Wheezing or Shortness of Breath. 1 Inhaler 5   ??? albuterol (PROVENTIL VENTOLIN) 2.5 mg /3 mL (0.083 %) nebu 1.5 mL by Nebulization route every four (4) hours as needed for Shortness of Breath. 30 Nebule 5   ??? cholecalciferol (VITAMIN D3) (1000 Units /25 mcg) tablet Take 2 Tabs by mouth daily. 180 Tab 1   ??? pantoprazole (PROTONIX) 40 mg tablet TAKE 1 TABLET BY MOUTH ONCE DAILY BEFORE BREAKFAST FOR 60 DAYS     ??? fluticasone  propionate (FLONASE) 50 mcg/actuation nasal spray 2 Sprays by Both Nostrils route daily. 1 Each 2   ??? levothyroxine (Euthyrox) 112 mcg tablet Take 1 Tablet by mouth Daily (before breakfast). 90 Tablet 1   ??? loratadine (CLARITIN) 10 mg tablet Take 1 Tablet by mouth daily. 90 Tablet 1   ??? hydroCHLOROthiazide (HYDRODIURIL) 25 mg tablet Take 1 Tablet by mouth daily. 90 Tablet 0   ??? omeprazole (PRILOSEC) 20 mg capsule Take 20 mg by mouth daily.     ??? linaCLOtide (Linzess) 145 mcg cap capsule Take 145 mcg by mouth Daily (before breakfast).     ??? diclofenac (VOLTAREN) 1 % gel Apply 1 g to affected area four times a day (Patient not taking: Reported on 11/06/2019) 1 Each 1   ??? Nebulizer Accessories kit Use as needed for shortness of breath 1 Kit 0   ??? Nebulizer & Compressor machine Use as needed for shortness of breath 1 Each 0   ??? cholecalciferol (VITAMIN D3) (50,000 UNITS /1250 MCG) capsule Take 1 Cap by mouth every seven (7) days. 8 Cap 0       No Known Allergies  Past Medical History:   Diagnosis  Date   ??? H/O seasonal allergies    ??? Hyperlipidemia    ??? Hypertension    ??? Hypothyroid    ??? 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 Impaired glucose tolerance      Past Surgical History:   Procedure Laterality Date   ??? 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 HX TONSIL AND ADENOIDECTOMY     ??? PR EGD DELIVER THERMAL ENERGY SPHNCTR/CARDIA GERD       Family History   Problem Relation Age of Onset   ??? Diabetes Mother      Social History     Socioeconomic History   ??? Marital status: SINGLE     Spouse name: Not on file   ??? Number of children: Not on file   ??? Years of education: Not on file   ??? Highest education level: Not on file   Occupational History   ??? Not on file   Tobacco Use   ??? Smoking status: Never Smoker   ??? Smokeless tobacco: Never Used   Vaping Use   ??? Vaping Use: Never used   Substance and Sexual Activity   ??? Alcohol use: No   ??? Drug use: No   ???  Sexual activity: Never   Other Topics Concern   ??? Not on file   Social History Narrative   ??? Not on file     Social Determinants of Health     Financial Resource Strain:    ??? Difficulty of Paying Living Expenses: Not on file   Food Insecurity:    ??? Worried About Running Out of Food in the Last Year: Not on file   ??? Ran Out of Food in the Last Year: Not on file   Transportation Needs:    ??? Lack of Transportation (Medical): Not on file   ??? Lack of Transportation (Non-Medical): Not on file   Physical Activity:    ??? Days of Exercise per Week: Not on file   ??? Minutes of Exercise per Session: Not on file   Stress:    ??? Feeling of Stress : Not on file   Social Connections:    ??? Frequency of Communication with Friends and Family: Not on file   ??? Frequency of Social Gatherings with Friends and Family: Not on file   ??? Attends Religious Services: Not on file   ??? Active Member of Clubs or Organizations: Not on file   ??? Attends Archivist Meetings: Not on file   ??? Marital Status: Not on file   Intimate Partner Violence:    ??? Fear of Current or Ex-Partner: Not on file   ??? Emotionally Abused: Not on file   ??? Physically Abused: Not on file   ??? Sexually Abused: Not on file   Housing Stability:    ??? Unable to Pay for Housing in the Last Year: Not on file   ??? Number of Places Lived in the Last Year: Not on file   ??? Unstable Housing in the Last Year: Not on file       HPI      ROS:   Review of Systems   Constitutional: Negative for chills, fever and malaise/fatigue.   Eyes: Negative for blurred vision.   Respiratory: Negative for cough and shortness of breath.    Cardiovascular: Negative for chest pain, palpitations and leg  swelling.   Neurological: Negative for dizziness and headaches.       Objective:     Visit Vitals  BP 137/84 (BP 1 Location: Left upper arm, BP Patient Position: Sitting, BP Cuff Size: Adult)   Pulse 74   Temp 97.7 ??F (36.5 ??C) (Temporal)   Resp 16   Ht '5\' 7"'  (1.702 m)   Wt 171 lb 12.8 oz (77.9 kg)   SpO2  100%   BMI 26.91 kg/m??         Vitals and Nurse Documentation reviewed.     Physical Exam  Constitutional:       General: She is not in acute distress.     Appearance: She is not diaphoretic.   HENT:      Head: Normocephalic and atraumatic.   Cardiovascular:      Rate and Rhythm: Normal rate and regular rhythm.      Heart sounds: Normal heart sounds. No murmur heard.  No friction rub. No gallop.    Pulmonary:      Effort: Pulmonary effort is normal. No respiratory distress.      Breath sounds: Normal breath sounds. No wheezing or rales.   Skin:     General: Skin is warm and dry.      Coloration: Skin is not pale.   Neurological:      Mental Status: She is alert and oriented to person, place, and time.   Psychiatric:         Mood and Affect: Affect normal. Mood is not anxious or depressed.         Behavior: Behavior is not agitated.         Thought Content: Thought content does not include homicidal or suicidal ideation.         Results for orders placed or performed in visit on 04/17/20   HEMOGLOBIN A1C WITH EAG   Result Value Ref Range    Hemoglobin A1c 5.7 (H) 4.0 - 5.6 %    Est. average glucose 117 mg/dL   TSH 3RD GENERATION   Result Value Ref Range    TSH 15.20 (H) 0.36 - 3.74 uIU/mL   CBC WITH AUTOMATED DIFF   Result Value Ref Range    WBC 4.9 3.6 - 11.0 K/uL    RBC 5.06 3.80 - 5.20 M/uL    HGB 13.1 11.5 - 16.0 g/dL    HCT 42.6 35.0 - 47.0 %    MCV 84.2 80.0 - 99.0 FL    MCH 25.9 (L) 26.0 - 34.0 PG    MCHC 30.8 30.0 - 36.5 g/dL    RDW 12.5 11.5 - 14.5 %    PLATELET 274 150 - 400 K/uL    MPV 12.3 8.9 - 12.9 FL    NRBC 0.0 0 PER 100 WBC    ABSOLUTE NRBC 0.00 0.00 - 0.01 K/uL    NEUTROPHILS 61 32 - 75 %    LYMPHOCYTES 23 12 - 49 %    MONOCYTES 8 5 - 13 %    EOSINOPHILS 7 0 - 7 %    BASOPHILS 1 0 - 1 %    IMMATURE GRANULOCYTES 0 0.0 - 0.5 %    ABS. NEUTROPHILS 3.0 1.8 - 8.0 K/UL    ABS. LYMPHOCYTES 1.2 0.8 - 3.5 K/UL    ABS. MONOCYTES 0.4 0.0 - 1.0 K/UL    ABS. EOSINOPHILS 0.3 0.0 - 0.4 K/UL    ABS. BASOPHILS 0.1  0.0 - 0.1 K/UL  ABS. IMM. GRANS. 0.0 0.00 - 0.04 K/UL    DF AUTOMATED     URINALYSIS W/MICROSCOPIC   Result Value Ref Range    Color YELLOW/STRAW      Appearance CLEAR CLEAR      Specific gravity 1.014 1.003 - 1.030      pH (UA) 7.5 5.0 - 8.0      Protein Negative Negative mg/dL    Glucose Negative Negative mg/dL    Ketone Negative Negative mg/dL    Bilirubin Negative Negative      Blood Negative Negative      Urobilinogen 0.2 0.2 - 1.0 EU/dL    Nitrites Negative Negative      Leukocyte Esterase Negative Negative      WBC 0-4 0 - 4 /hpf    RBC 0-5 0 - 5 /hpf    Epithelial cells FEW FEW /lpf    Bacteria Negative Negative /hpf    Hyaline cast 0-2 0 - 5 /lpf   VITAMIN D, 25 HYDROXY   Result Value Ref Range    Vitamin D 25-Hydroxy 13.0 (L) 30 - 100 ng/mL   MICROALBUMIN, UR, RAND W/ MICROALB/CREAT RATIO   Result Value Ref Range    Microalbumin,urine random 0.69 MG/DL    Creatinine, urine 71.40 mg/dL    Microalbumin/Creat ratio (mg/g creat) 10 0 - 30 mg/g   METABOLIC PANEL, COMPREHENSIVE   Result Value Ref Range    Sodium 136 136 - 145 mmol/L    Potassium 4.1 3.5 - 5.1 mmol/L    Chloride 102 97 - 108 mmol/L    CO2 32 21 - 32 mmol/L    Anion gap 2 (L) 5 - 15 mmol/L    Glucose 85 65 - 100 mg/dL    BUN 18 6 - 20 MG/DL    Creatinine 0.72 0.55 - 1.02 MG/DL    BUN/Creatinine ratio 25 (H) 12 - 20      GFR est AA >60 >60 ml/min/1.10m    GFR est non-AA >60 >60 ml/min/1.771m   Calcium 9.7 8.5 - 10.1 MG/DL    Bilirubin, total 1.5 (H) 0.2 - 1.0 MG/DL    ALT (SGPT) 68 12 - 78 U/L    AST (SGOT) 46 (H) 15 - 37 U/L    Alk. phosphatase 104 45 - 117 U/L    Protein, total 8.4 (H) 6.4 - 8.2 g/dL    Albumin 4.4 3.5 - 5.0 g/dL    Globulin 4.0 2.0 - 4.0 g/dL    A-G Ratio 1.1 1.1 - 2.2     LIPID PANEL   Result Value Ref Range    Cholesterol, total 302 (H) <200 MG/DL    Triglyceride 182 (H) <150 MG/DL    HDL Cholesterol 59 MG/DL    LDL, calculated 206.6 (H) 0 - 100 MG/DL    VLDL, calculated 36.4 MG/DL    CHOL/HDL Ratio 5.1 (H) 0.0 - 5.0

## 2020-05-01 NOTE — Progress Notes (Signed)
Identified pt with two pt identifiers(name and DOB). Reviewed record in preparation for visit and have obtained necessary documentation.  Chief Complaint   Patient presents with   . Labs     Discuss results        Health Maintenance Due   Topic   . DTaP/Tdap/Td series (1 - Tdap)   . Colorectal Cancer Screening Combo    . Shingrix Vaccine Age 54> (1 of 2)   . COVID-19 Vaccine (3 - Booster for [employer] series)       Coordination of Care Questionnaire:  :   1) Have you been to an emergency room, urgent care, or hospitalized since your last visit?  If yes, where when, and reason for visit? no      2. Have seen or consulted any other health care provider since your last visit?   If yes, where when, and reason for visit?  no      Patient is accompanied by self I have received verbal consent from Jae Dire to discuss any/all medical information while they are present in the room.

## 2020-07-10 ENCOUNTER — Ambulatory Visit: Admit: 2020-07-10 | Payer: BLUE CROSS/BLUE SHIELD | Attending: Family | Primary: Family

## 2020-07-10 ENCOUNTER — Ambulatory Visit: Attending: Family | Primary: Family

## 2020-07-10 DIAGNOSIS — R32 Unspecified urinary incontinence: Secondary | ICD-10-CM

## 2020-07-10 LAB — AMB POC URINALYSIS DIP STICK AUTO W/O MICRO
Bilirubin (UA POC): NEGATIVE
Bilirubin, Urine, POC: NEGATIVE
Blood (UA POC): NEGATIVE
Blood (UA POC): NEGATIVE
Glucose (UA POC): NEGATIVE
Glucose, Urine, POC: NEGATIVE
Ketones (UA POC): NEGATIVE
Ketones, Urine, POC: NEGATIVE
Nitrite, Urine, POC: NEGATIVE
Nitrites (UA POC): NEGATIVE
Protein (UA POC): NEGATIVE
Protein, Urine, POC: NEGATIVE
Specific Gravity, Urine, POC: 1.025 NA (ref 1.001–1.035)
Specific gravity (UA POC): 1.025 (ref 1.001–1.035)
Urobilinogen (UA POC): 0.2 (ref 0.2–1)
Urobilinogen, POC: 0.2 (ref 0.2–1)
pH (UA POC): 6.5 (ref 4.6–8.0)
pH, Urine, POC: 6.5 NA (ref 4.6–8.0)

## 2020-07-10 MED ORDER — OXYBUTYNIN CHLORIDE 5 MG TAB
5 mg | ORAL_TABLET | Freq: Three times a day (TID) | ORAL | 0 refills | Status: AC
Start: 2020-07-10 — End: 2020-10-05

## 2020-07-10 NOTE — Progress Notes (Signed)
 Jacqueline Mckinney is a 55 y.o. female      Chief Complaint   Patient presents with   . Follow-up     note due to mask replacment          1. Have you been to the ER, urgent care clinic since your last visit?  No Hospitalized since your last visit? No       2. Have you seen or consulted any other health care providers outside of the [facility] since your last visit?  Include any pap smears or colon screening.  No

## 2020-07-10 NOTE — Progress Notes (Signed)
Assessment/Plan:     Diagnoses and all orders for this visit:    1. Urinary incontinence, unspecified type  -     AMB POC URINALYSIS DIP STICK AUTO W/O MICRO  -     CULTURE, URINE; Future  -     oxybutynin (DITROPAN) 5 mg tablet; Take 1 Tablet by mouth three (3) times daily.  - Unchanged, normal UA today will culture.  Will start oxybutynin, side effects discussed    2. Encounter for completion of form with patient   -letter given for patient for accommodations at work for different mask as required mask it too tight due to history brain surgery    3. History of brain tumor        Follow-up and Dispositions    ?? Return in about 4 weeks (around 08/07/2020).         Discussed expected course/resolution/complications of diagnosis in detail with patient.    Medication risks/benefits/costs/interactions/alternatives discussed with patient.    Pt was given after visit summary which includes diagnoses, current medications & vitals.   Pt expressed understanding with the diagnosis and plan        Subjective:      Jacqueline Mckinney is a 55 y.o. female who presents for had concerns including Follow-up (note due to mask replacment ).     Here today for note for work for a less tight mask as she has a history of brain surgery in the back of her head in 2005.  The new N95 mask cause her pain and the old N95 mask does not cause pain.     Hypothyroidism  Has been taking levothyroxine as directed, does not miss a dose    Urinary incontinence  Every day, going on for a month  She states she leaks all day and all night  Denies dysuria, urgency, frequency    Current Outpatient Medications   Medication Sig Dispense Refill   ??? oxybutynin (DITROPAN) 5 mg tablet Take 1 Tablet by mouth three (3) times daily. 60 Tablet 0   ??? pantoprazole (PROTONIX) 40 mg tablet TAKE 1 TABLET BY MOUTH ONCE DAILY BEFORE BREAKFAST FOR 60 DAYS     ??? fluticasone propionate (FLONASE) 50 mcg/actuation nasal spray 2 Sprays by Both Nostrils route daily. 1 Each 2   ???  levothyroxine (Euthyrox) 112 mcg tablet Take 1 Tablet by mouth Daily (before breakfast). 90 Tablet 1   ??? loratadine (CLARITIN) 10 mg tablet Take 1 Tablet by mouth daily. 90 Tablet 1   ??? hydroCHLOROthiazide (HYDRODIURIL) 25 mg tablet Take 1 Tablet by mouth daily. 90 Tablet 0   ??? linaCLOtide (Linzess) 145 mcg cap capsule Take 145 mcg by mouth Daily (before breakfast).     ??? diclofenac (VOLTAREN) 1 % gel Apply 1 g to affected area four times a day 1 Each 1   ??? albuterol (PROVENTIL HFA, VENTOLIN HFA, PROAIR HFA) 90 mcg/actuation inhaler Take 1 Puff by inhalation every four (4) hours as needed for Wheezing or Shortness of Breath. 1 Inhaler 5   ??? albuterol (PROVENTIL VENTOLIN) 2.5 mg /3 mL (0.083 %) nebu 1.5 mL by Nebulization route every four (4) hours as needed for Shortness of Breath. 30 Nebule 5   ??? cholecalciferol (VITAMIN D3) (1000 Units /25 mcg) tablet Take 2 Tabs by mouth daily. 180 Tab 1   ??? azelastine (ASTELIN) 137 mcg (0.1 %) nasal spray USE 1 TO 2 SPRAY(S) IN EACH NOSTRIL EVERY 12 HOURS AS NEEDED     ???  Nebulizer Accessories kit Use as needed for shortness of breath 1 Kit 0   ??? Nebulizer & Compressor machine Use as needed for shortness of breath 1 Each 0       No Known Allergies  Past Medical History:   Diagnosis Date   ??? H/O seasonal allergies    ??? Hyperlipidemia    ??? Hypertension    ??? Hypothyroid    ??? 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 Impaired glucose tolerance      Past Surgical History:   Procedure Laterality Date   ??? 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 HX TONSIL AND ADENOIDECTOMY     ??? PR EGD DELIVER THERMAL ENERGY SPHNCTR/CARDIA GERD       Family History   Problem Relation Age of Onset   ??? Diabetes Mother      Social History     Socioeconomic History   ??? Marital status: SINGLE     Spouse name: Not on file   ??? Number of children: Not on file   ??? Years of education: Not on file   ??? Highest education level: Not on file    Occupational History   ??? Not on file   Tobacco Use   ??? Smoking status: Never Smoker   ??? Smokeless tobacco: Never Used   Vaping Use   ??? Vaping Use: Never used   Substance and Sexual Activity   ??? Alcohol use: No   ??? Drug use: No   ??? Sexual activity: Never   Other Topics Concern   ??? Not on file   Social History Narrative   ??? Not on file     Social Determinants of Health     Financial Resource Strain:    ??? Difficulty of Paying Living Expenses: Not on file   Food Insecurity:    ??? Worried About Running Out of Food in the Last Year: Not on file   ??? Ran Out of Food in the Last Year: Not on file   Transportation Needs:    ??? Lack of Transportation (Medical): Not on file   ??? Lack of Transportation (Non-Medical): Not on file   Physical Activity:    ??? Days of Exercise per Week: Not on file   ??? Minutes of Exercise per Session: Not on file   Stress:    ??? Feeling of Stress : Not on file   Social Connections:    ??? Frequency of Communication with Friends and Family: Not on file   ??? Frequency of Social Gatherings with Friends and Family: Not on file   ??? Attends Religious Services: Not on file   ??? Active Member of Clubs or Organizations: Not on file   ??? Attends Archivist Meetings: Not on file   ??? Marital Status: Not on file   Intimate Partner Violence:    ??? Fear of Current or Ex-Partner: Not on file   ??? Emotionally Abused: Not on file   ??? Physically Abused: Not on file   ??? Sexually Abused: Not on file   Housing Stability:    ??? Unable to Pay for Housing in the Last Year: Not on file   ??? Number of Places Lived in the Last Year: Not on file   ??? Unstable Housing in the Last Year: Not on file       HPI  ROS:   Review of Systems   Constitutional: Negative for chills, fever and malaise/fatigue.   Eyes: Negative for blurred vision.   Respiratory: Negative for cough and shortness of breath.    Cardiovascular: Negative for chest pain, palpitations and leg swelling.   Genitourinary: Positive for frequency. Negative for dysuria,  flank pain, hematuria and urgency.   Neurological: Negative for dizziness and headaches.       Objective:     Visit Vitals  BP (!) 153/95 (BP 1 Location: Right upper arm)   Pulse 79   Temp 97.4 ??F (36.3 ??C) (Temporal)   Resp 16   Wt 170 lb 12.8 oz (77.5 kg)   SpO2 96%   BMI 26.75 kg/m??         Vitals and Nurse Documentation reviewed.     Physical Exam  Constitutional:       General: She is not in acute distress.     Appearance: She is not diaphoretic.   HENT:      Head: Normocephalic and atraumatic.   Cardiovascular:      Rate and Rhythm: Normal rate and regular rhythm.      Heart sounds: Normal heart sounds. No murmur heard.  No friction rub. No gallop.    Pulmonary:      Effort: Pulmonary effort is normal. No respiratory distress.      Breath sounds: Normal breath sounds. No wheezing or rales.   Skin:     General: Skin is warm and dry.      Coloration: Skin is not pale.   Neurological:      Mental Status: She is alert and oriented to person, place, and time.   Psychiatric:         Mood and Affect: Affect normal. Mood is not anxious or depressed.         Behavior: Behavior is not agitated.         Thought Content: Thought content does not include homicidal or suicidal ideation.         Results for orders placed or performed in visit on 07/10/20   CULTURE, URINE    Specimen: Clean catch; Urine   Result Value Ref Range    Special Requests: NO SPECIAL REQUESTS      Culture result: No growth (<1,000 CFU/ML)     AMB POC URINALYSIS DIP STICK AUTO W/O MICRO   Result Value Ref Range    Color (UA POC) Yellow     Clarity (UA POC) Clear     Glucose (UA POC) Negative Negative    Bilirubin (UA POC) Negative Negative    Ketones (UA POC) Negative Negative    Specific gravity (UA POC) 1.025 1.001 - 1.035    Blood (UA POC) Negative Negative    pH (UA POC) 6.5 4.6 - 8.0    Protein (UA POC) Negative Negative    Urobilinogen (UA POC) 0.2 mg/dL 0.2 - 1    Nitrites (UA POC) Negative Negative    Leukocyte esterase (UA POC) Trace Negative

## 2020-07-10 NOTE — Progress Notes (Signed)
Urine culture was normal with no bacteria growth

## 2020-07-11 LAB — CULTURE, URINE
Culture result:: NO GROWTH
Culture: NO GROWTH

## 2020-07-30 ENCOUNTER — Encounter

## 2020-07-30 LAB — LIPID PANEL
Cholesterol, Total: 225 mg/dL — ABNORMAL HIGH (ref 100–199)
Cholesterol, total: 225 mg/dL — ABNORMAL HIGH (ref 100–199)
HDL Cholesterol: 50 mg/dL (ref >39)
HDL: 50 mg/dL (ref >39)
LDL Calculated: 133 mg/dL — ABNORMAL HIGH (ref 0–99)
LDL, calculated: 133 mg/dL — ABNORMAL HIGH (ref 0–99)
Triglyceride: 235 mg/dL — ABNORMAL HIGH (ref 0–149)
Triglycerides: 235 mg/dL — ABNORMAL HIGH (ref 0–149)
VLDL, calculated: 42 mg/dL — ABNORMAL HIGH (ref 5–40)
VLDL: 42 mg/dL — ABNORMAL HIGH (ref 5–40)

## 2020-07-30 LAB — AMB EXT LDL-C
LDL-C, External: 133
LDL-C, External: 133 NA

## 2020-07-30 LAB — VITAMIN D, 25 HYDROXY: VITAMIN D, 25-HYDROXY: 24 ng/mL — ABNORMAL LOW (ref 30.0–100.0)

## 2020-07-30 LAB — TSH 3RD GENERATION
TSH: 4.18 u[IU]/mL (ref 0.450–4.500)
TSH: 4.18 u[IU]/mL (ref 0.450–4.500)

## 2020-07-30 LAB — VITAMIN D 25 HYDROXY: Vit D, 25-Hydroxy: 24 ng/mL — ABNORMAL LOW (ref 30.0–100.0)

## 2020-07-30 MED ORDER — HYDROCHLOROTHIAZIDE 25 MG TAB
25 mg | ORAL_TABLET | ORAL | 1 refills | Status: DC
Start: 2020-07-30 — End: 2020-08-24

## 2020-08-06 NOTE — Telephone Encounter (Signed)
Please advise!!    Addy from [facility] called into speak to NP combs or her nurse about patient condition. Addy can be reached at [phone]

## 2020-08-06 NOTE — Telephone Encounter (Signed)
Addy from [facility] @ [phone] would like to start Jacqueline Mckinney on Spironolactone 100 mg for Alopecia.     Just would like to know will this is ok.. Please advise, I will call her back

## 2020-08-07 NOTE — Telephone Encounter (Signed)
Reached out to [facility] office on Friday, office is closed.

## 2020-08-10 NOTE — Telephone Encounter (Signed)
Rc- to Addy from [facility] @ [phone] with NP Combs message. Addy verbally understood

## 2020-08-22 ENCOUNTER — Encounter

## 2020-08-24 MED ORDER — HYDROCHLOROTHIAZIDE 25 MG TAB
25 mg | ORAL_TABLET | ORAL | 1 refills | Status: AC
Start: 2020-08-24 — End: 2021-04-21

## 2020-09-04 ENCOUNTER — Ambulatory Visit: Admit: 2020-09-04 | Discharge: 2020-09-04 | Payer: BLUE CROSS/BLUE SHIELD | Attending: Family | Primary: Family

## 2020-09-04 ENCOUNTER — Ambulatory Visit: Attending: Family | Primary: Family

## 2020-09-04 DIAGNOSIS — Z09 Encounter for follow-up examination after completed treatment for conditions other than malignant neoplasm: Secondary | ICD-10-CM

## 2020-09-04 MED ORDER — HYDROCORTISONE 2.5 % RECTAL CREAM
2.5 % | Freq: Four times a day (QID) | CUTANEOUS | 0 refills | Status: AC
Start: 2020-09-04 — End: 2020-10-02

## 2020-09-04 NOTE — Progress Notes (Signed)
Transitional Care Management Progress Note    Patient: Jacqueline Mckinney  DOB: October 08, 1965  PCP: Marguarite Arbour, NP    Date of admission: 8/23  Date of discharge: 8/23    Patient was contacted by Transitional Care Management services within two days after her discharge: No. This encounter and supporting documentation was reviewed if available.  Medication reconciliation was performed today (09/04/2020).     Assessment/Plan:   Diagnoses and all orders for this visit:    1. Hospital discharge follow-up  -     PR DISCHARGE MEDS RECONCILED W/ CURRENT OUTPATIENT MED LIST    2. Strain of left trapezius muscle, subsequent encounter  -     REFERRAL TO PHYSICAL THERAPY  - Worsening, advised to change what she does at work, use right hand, ref to PT for further treatment    3. Chest pain, unspecified type  -     REFERRAL TO CARDIOLOGY  - Denies CP today, ref to Card for further eval and treatment.  Seek urgent care if CP returns    4. Rectal bleeding  -     CBC WITH AUTOMATED DIFF; Future  - No bleeding up exam ext hemorrhoid present, will get CBC, advised to seek urgent for any severe abd pain, bright red blood    5. Grade II hemorrhoids  -     hydrocortisone (ANUSOL-HC) 2.5 % rectal cream; Insert  into rectum four (4) times daily.  - Unchanged, advised to drink plenty of water, do not strain, may use stool softners and apply anusol as directed.  Follow up if symptoms persist.        Subjective:   Jacqueline Mckinney is a 55 y.o. female presenting today for follow-up after being discharged from [facility].  The discharge summary was reviewed or requested.  The main problem requiring admission was chest pain, SOB.   Complications during admission: none  Went to ER with chest pain and SOB times 2 days.  EKG, chest xray and labs were normal.  Tropins were normal. Was advised to see Cardiology  Interval history/Current status: stable    Admitting symptoms have: significantly improved    Rectal bleeding  Happened last  night when she wiped  Has never happened before  Only on the toilet paper  Denies CP, SOB, abd pain, bloody stool, diarrhea, n/v, constipation      Medications marked "taking" at this time:  Home Medications    Medication Sig Start Date End Date Taking? Authorizing Provider   hydroCHLOROthiazide (HYDRODIURIL) 25 mg tablet Take 1 tablet by mouth once daily 08/24/20   Levon Hedger B, NP   oxybutynin (DITROPAN) 5 mg tablet Take 1 Tablet by mouth three (3) times daily. 07/10/20   Ramie Palladino, Dani Gobble, NP   pantoprazole (PROTONIX) 40 mg tablet TAKE 1 TABLET BY MOUTH ONCE DAILY BEFORE BREAKFAST FOR 60 DAYS 03/02/20   Provider, Historical   azelastine (ASTELIN) 137 mcg (0.1 %) nasal spray USE 1 TO 2 SPRAY(S) IN EACH NOSTRIL EVERY 12 HOURS AS NEEDED 03/15/20   Provider, Historical   fluticasone propionate (FLONASE) 50 mcg/actuation nasal spray 2 Sprays by Both Nostrils route daily. 04/17/20   Marguarite Arbour, NP   levothyroxine (Euthyrox) 112 mcg tablet Take 1 Tablet by mouth Daily (before breakfast). 04/17/20   Marguarite Arbour, NP   loratadine (CLARITIN) 10 mg tablet Take 1 Tablet by mouth daily. 04/17/20   Marguarite Arbour, NP   linaCLOtide (Linzess) 145 mcg cap capsule  Take 145 mcg by mouth Daily (before breakfast).    Provider, Historical   diclofenac (VOLTAREN) 1 % gel Apply 1 g to affected area four times a day 05/22/19   Marguarite Arbour, NP   Nebulizer Accessories kit Use as needed for shortness of breath 01/24/19   Lubertha Basque, MD   Nebulizer & Compressor machine Use as needed for shortness of breath 01/24/19   Lubertha Basque, MD   albuterol (PROVENTIL HFA, VENTOLIN HFA, PROAIR HFA) 90 mcg/actuation inhaler Take 1 Puff by inhalation every four (4) hours as needed for Wheezing or Shortness of Breath. 01/24/19   Lubertha Basque, MD   albuterol (PROVENTIL VENTOLIN) 2.5 mg /3 mL (0.083 %) nebu 1.5 mL by Nebulization route every four (4) hours as needed for Shortness of Breath. 01/24/19   Lubertha Basque, MD    cholecalciferol (VITAMIN D3) (1000 Units /25 mcg) tablet Take 2 Tabs by mouth daily. 11/12/18   Marguarite Arbour, NP        Review of Systems:  Review of Systems   Constitutional:  Negative for chills, fever and malaise/fatigue.   Eyes:  Negative for blurred vision.   Respiratory:  Negative for cough and shortness of breath.    Cardiovascular:  Negative for chest pain, palpitations and leg swelling.   Gastrointestinal:  Positive for blood in stool. Negative for abdominal pain, constipation, diarrhea, heartburn, melena, nausea and vomiting.   Neurological:  Negative for dizziness and headaches.          Patient Active Problem List   Diagnosis Code    Essential hypertension I10    Impaired glucose tolerance R73.02    Primary hypothyroidism E03.9    Rash R21    Unsatisfactory vaginal cytology smear R87.625          Objective:   BP 110/71 (BP 1 Location: Left upper arm, BP Patient Position: Sitting, BP Cuff Size: Adult long)    Pulse 83    Temp 97.2 ??F (36.2 ??C) (Temporal)    Resp 16    Ht '5\' 7"'$  (1.702 m)    Wt 169 lb 14.4 oz (77.1 kg)    SpO2 98%    BMI 26.61 kg/m??      Physical Examination: General appearance - alert, well appearing, and in no distress  Physical Exam  Constitutional:       General: She is not in acute distress.     Appearance: She is not diaphoretic.   HENT:      Head: Normocephalic and atraumatic.   Cardiovascular:      Rate and Rhythm: Normal rate and regular rhythm.      Heart sounds: Normal heart sounds. No murmur heard.    No friction rub. No gallop.   Pulmonary:      Effort: Pulmonary effort is normal. No respiratory distress.      Breath sounds: Normal breath sounds. No wheezing or rales.   Genitourinary:     Rectum: Guaiac result negative. External hemorrhoid present. No internal hemorrhoid.   Skin:     General: Skin is warm and dry.      Coloration: Skin is not pale.   Neurological:      Mental Status: She is alert.         We discussed the expected course, resolution and complications of  the diagnosis(es) in detail.  Medication risks, benefits, costs, interactions, and alternatives were discussed as indicated.  I advised her to contact the office if her condition worsens, changes  or fails to improve as anticipated. She expressed understanding with the diagnosis(es) and plan.     Marguarite Arbour, NP

## 2020-09-04 NOTE — Progress Notes (Signed)
Identified pt with two pt identifiers(name and DOB). Reviewed record in preparation for visit and have obtained necessary documentation.  Chief Complaint   Patient presents with    Hospital Follow Up     Chest Pain, SOB, Left arm pain- Frann Rider     Referral Request     Cardiologist        Health Maintenance Due   Topic    DTaP/Tdap/Td series (1 - Tdap)    Colorectal Cancer Screening Combo     Shingrix Vaccine Age 54> (1 of 2)    COVID-19 Vaccine (3 - Booster for [employer] series)       Coordination of Care Questionnaire:  :   1) Have you been to an emergency room, urgent care, or hospitalized since your last visit?  If yes, where when, and reason for visit? Yes, Chest Pain, SOB, left arm pain      2. Have seen or consulted any other health care provider since your last visit?   If yes, where when, and reason for visit?  NO        Patient is accompanied by self I have received verbal consent from Jae Dire to discuss any/all medical information while they are present in the room.

## 2020-09-05 LAB — CBC WITH AUTOMATED DIFF
ABS. BASOPHILS: 0.1 10*3/uL (ref 0.0–0.1)
ABS. EOSINOPHILS: 0.1 10*3/uL (ref 0.0–0.4)
ABS. IMM. GRANS.: 0 10*3/uL (ref 0.00–0.04)
ABS. LYMPHOCYTES: 1.4 10*3/uL (ref 0.8–3.5)
ABS. MONOCYTES: 0.4 10*3/uL (ref 0.0–1.0)
ABS. NEUTROPHILS: 2.7 10*3/uL (ref 1.8–8.0)
ABSOLUTE NRBC: 0 10*3/uL (ref 0.00–0.01)
BASOPHILS: 1 % (ref 0–1)
EOSINOPHILS: 3 % (ref 0–7)
HCT: 38.5 % (ref 35.0–47.0)
HGB: 12 g/dL (ref 11.5–16.0)
IMMATURE GRANULOCYTES: 0 % (ref 0.0–0.5)
LYMPHOCYTES: 29 % (ref 12–49)
MCH: 24.9 PG — ABNORMAL LOW (ref 26.0–34.0)
MCHC: 31.2 g/dL (ref 30.0–36.5)
MCV: 79.9 FL — ABNORMAL LOW (ref 80.0–99.0)
MONOCYTES: 8 % (ref 5–13)
MPV: 11.9 FL (ref 8.9–12.9)
NEUTROPHILS: 59 % (ref 32–75)
NRBC: 0 PER 100 WBC
PLATELET: 205 10*3/uL (ref 150–400)
RBC: 4.82 M/uL (ref 3.80–5.20)
RDW: 13.1 % (ref 11.5–14.5)
WBC: 4.7 10*3/uL (ref 3.6–11.0)

## 2020-09-05 LAB — CBC WITH AUTO DIFFERENTIAL
Basophils %: 1 % (ref 0–1)
Basophils Absolute: 0.1 10*3/uL (ref 0.0–0.1)
Eosinophils %: 3 % (ref 0–7)
Eosinophils Absolute: 0.1 10*3/uL (ref 0.0–0.4)
Granulocyte Absolute Count: 0 10*3/uL (ref 0.00–0.04)
Hematocrit: 38.5 % (ref 35.0–47.0)
Hemoglobin: 12 g/dL (ref 11.5–16.0)
Immature Granulocytes: 0 % (ref 0.0–0.5)
Lymphocytes %: 29 % (ref 12–49)
Lymphocytes Absolute: 1.4 10*3/uL (ref 0.8–3.5)
MCH: 24.9 PG — ABNORMAL LOW (ref 26.0–34.0)
MCHC: 31.2 g/dL (ref 30.0–36.5)
MCV: 79.9 FL — ABNORMAL LOW (ref 80.0–99.0)
MPV: 11.9 FL (ref 8.9–12.9)
Monocytes %: 8 % (ref 5–13)
Monocytes Absolute: 0.4 10*3/uL (ref 0.0–1.0)
NRBC Absolute: 0 10*3/uL (ref 0.00–0.01)
Neutrophils %: 59 % (ref 32–75)
Neutrophils Absolute: 2.7 10*3/uL (ref 1.8–8.0)
Nucleated RBCs: 0 PER 100 WBC
Platelets: 205 10*3/uL (ref 150–400)
RBC: 4.82 M/uL (ref 3.80–5.20)
RDW: 13.1 % (ref 11.5–14.5)
WBC: 4.7 10*3/uL (ref 3.6–11.0)

## 2020-10-02 ENCOUNTER — Ambulatory Visit: Admit: 2020-10-02 | Discharge: 2020-10-02 | Payer: BLUE CROSS/BLUE SHIELD | Attending: "Endocrinology | Primary: Family

## 2020-10-02 ENCOUNTER — Ambulatory Visit: Attending: "Endocrinology | Primary: Family

## 2020-10-02 DIAGNOSIS — E039 Hypothyroidism, unspecified: Secondary | ICD-10-CM

## 2020-10-02 DIAGNOSIS — L659 Nonscarring hair loss, unspecified: Secondary | ICD-10-CM

## 2020-10-02 NOTE — Progress Notes (Signed)
HISTORY OF PRESENT ILLNESS  Jacqueline Mckinney is a 55 y.o. female.  HPI  Initial visit  for hair loss help over the scalp     She has h/o hypothyroidism for 17 years   TSH  was 13  in June 2021 ( ? Non compliant )   On replacement therapy for 17 years . Her TSH is better in June 2022- down to 4    She saw a derm and got spironolactone and Rogaine for scalp   She refuses to STAY on spironolactone  and she stopped it after a month. She felt like it made her lose weight     She is anxious as she almost missed a MVA    She is feeling tired and her legs are crampy   She had brain tumor surgery 2005  @ [facility]  - she is not  clear about what kind           Past Medical History:   Diagnosis Date    H/O seasonal allergies     Hyperlipidemia     Hypertension     Hypothyroid     Ill-defined condition 2005    ependyoma resection  2005, s/p radiation at [facility]    Impaired glucose tolerance        Social History     Socioeconomic History    Marital status: SINGLE     Spouse name: Not on file    Number of children: Not on file    Years of education: Not on file    Highest education level: Not on file   Occupational History    Not on file   Tobacco Use    Smoking status: Never    Smokeless tobacco: Never   Vaping Use    Vaping Use: Never used   Substance and Sexual Activity    Alcohol use: No    Drug use: No    Sexual activity: Never   Other Topics Concern    Not on file   Social History Narrative    Not on file     Social Determinants of Health     Financial Resource Strain: Not on file   Food Insecurity: Not on file   Transportation Needs: Not on file   Physical Activity: Not on file   Stress: Not on file   Social Connections: Not on file   Intimate Partner Violence: Not on file   Housing Stability: Not on file       Family History   Problem Relation Age of Onset    Diabetes Mother        Review of Systems   As above       Physical Exam   Constitutional: She is oriented to person, place, and time. She appears well-developed and  well-nourished.   Scalp hair  loss noted   Psychiatric: She has depressed affect         ASSESSMENT and PLAN    Scalp hair loss  :  she is off spironolactone   for 2 weeks . Spirinolactone did not help her and she does not want to take it. Asking her to do labs to check TT levels in a month to get the spironolactone effect off. She is in menopause and there can be TT and E2 imbalance also. She saw derm already . She is informed that I will not be able to help her much if there is no hormone abnormalty  . Suggested using BIOTIN  2. Hypothyroidism  : on synthroid 112 mcg a day . Compliance emphasized     3. Hyperlipidemia  : TC- 225; TG-235; HDL -50  and  LDL 133 - she is not diabetic and is not on meds     4. Slightly low vit D       Reviewed results with patient and discussed the labs being ordered today/bnv  Patient voiced understanding of plan of care

## 2020-10-02 NOTE — Progress Notes (Signed)
 1. Have you been to the ER, urgent care clinic since your last visit?  Hospitalized since your last visit?No    2. Have you seen or consulted any other health care providers outside of the [facility] since your last visit?  Include any pap smears or colon screening. No  Chief Complaint   Patient presents with    New Patient    Other     Hair loss       Visit Vitals  BP 118/72 (BP 1 Location: Left upper arm, BP Patient Position: Sitting, BP Cuff Size: Large adult)   Pulse 80   Temp 97.5 F (36.4 C) (Temporal)   Ht 5' 7 (1.702 m)   Wt 170 lb (77.1 kg)   SpO2 99%   BMI 26.63 kg/m

## 2020-10-02 NOTE — Addendum Note (Signed)
Addendum  Note by Jan Fireman, MD at 10/02/20 1130                Author: Jan Fireman, MD  Service: --  Author Type: Physician       Filed: 10/05/20 1332  Encounter Date: 10/02/2020  Status: Signed          Editor: Jan Fireman, MD (Physician)          Addended by: Jan Fireman on: 10/05/2020 01:32 PM    Modules accepted: Orders

## 2020-10-30 ENCOUNTER — Ambulatory Visit: Admit: 2020-10-30 | Discharge: 2020-10-30 | Payer: BLUE CROSS/BLUE SHIELD | Primary: Family

## 2020-10-30 DIAGNOSIS — E039 Hypothyroidism, unspecified: Secondary | ICD-10-CM

## 2020-10-30 LAB — T4, FREE
T4 Free: 1.2 NG/DL (ref 0.8–1.5)
T4, Free: 1.2 NG/DL (ref 0.8–1.5)

## 2020-10-30 LAB — FSH AND LH
FSH, Serum: 48.5 m[IU]/mL
FSH: 48.5 m[IU]/mL
Luteinizing Hormone: 20.8 m[IU]/mL
Luteinizing hormone: 20.8 m[IU]/mL

## 2020-10-30 LAB — TSH 3RD GENERATION
TSH: 1.1 u[IU]/mL (ref 0.36–3.74)
TSH: 1.1 u[IU]/mL (ref 0.36–3.74)

## 2020-10-30 LAB — PROLACTIN
Prolactin: 7.6 ng/mL
Prolactin: 7.6 ng/mL

## 2020-11-01 LAB — THYROID ANTIBODY PANEL
Thyroglobulin Ab: 66.5 IU/mL — ABNORMAL HIGH (ref 0.0–0.9)
Thyroid peroxidase Ab: 204 IU/mL — ABNORMAL HIGH (ref 0–34)

## 2020-11-01 LAB — DHEA SULFATE
DHEA SULFATE,DHEAS: 59.4 ug/dL (ref 41.2–243.7)
DHEA Sulfate: 59.4 ug/dL (ref 41.2–243.7)

## 2020-11-01 LAB — SEX HORMONE BINDING GLOBULIN
SEX HORM BIND GLOB,SHBG: 30.7 nmol/L (ref 17.3–125.0)
Sex Hormone Binding Globulin: 30.7 nmol/L (ref 17.3–125.0)

## 2020-11-01 LAB — THYROID ANTIBODIES
Thyroglobulin Ab: 66.5 IU/mL — ABNORMAL HIGH (ref 0.0–0.9)
Thyroid Peroxidase Antibody: 204 IU/mL — ABNORMAL HIGH (ref 0–34)

## 2020-11-02 LAB — ESTRADIOL
ESTRADIOL,ESTRD: <11 pg/mL
Estradiol: <11 pg/mL

## 2020-11-02 LAB — TESTOSTERONE, TOTAL, FEMALE/CHILD
TESTOSTERONE,TESFC: 19.6 ng/dL
Testosterone: 19.6 ng/dL

## 2020-11-04 ENCOUNTER — Ambulatory Visit: Attending: "Endocrinology | Primary: Family

## 2020-11-04 DIAGNOSIS — E039 Hypothyroidism, unspecified: Secondary | ICD-10-CM

## 2020-11-04 NOTE — Progress Notes (Signed)
Jacqueline Mckinney is a 55 y.o. female here for   Chief Complaint   Patient presents with    Thyroid Problem       1. Have you been to the ER, urgent care clinic since your last visit?  Hospitalized since your last visit? -no    2. Have you seen or consulted any other health care providers outside of the [facility] since your last visit?  Include any pap smears or colon screening.-no

## 2020-11-04 NOTE — Progress Notes (Signed)
HISTORY OF PRESENT ILLNESS  Jacqueline Mckinney is a 55 y.o. female.  HPI  First follow up  after    Initial visit  for hair loss help over the scalp   from sept 2022     Very pleasant  Had labs       Sept 2022     She has h/o hypothyroidism for 17 years   TSH  was 13  in June 2021 ( ? Non compliant )   On replacement therapy for 17 years . Her TSH is better in June 2022- down to 4    She saw a derm and got spironolactone and Rogaine for scalp   She refuses to STAY on spironolactone  and she stopped it after a month. She felt like it made her lose weight     She is anxious as she almost missed a MVA    She is feeling tired and her legs are crampy   She had brain tumor surgery 2005  @ [facility]  - she is not  clear about what kind             Review of Systems   As above       Physical Exam   Constitutional: She is oriented to person, place, and time. She appears well-developed and well-nourished.   Scalp hair  loss noted   Psychiatric: She has depressed affect         ASSESSMENT and PLAN    Scalp hair loss  :  she is off spironolactone   for 2 weeks . Spirinolactone did not help her and she does not want to take it. Asking her to do labs to check TT levels in a month to get the spironolactone effect off. She is in menopause and there can be TT and E2 imbalance also. She saw derm already . She is informed that I will not be able to help her much if there is no hormone abnormalty  . Suggested using BIOTIN  Her T levels are low like  in normal menopause . She has alopecia , and I do not see anything I could help her , so asking her to continue with dermatology     2. 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 Compliance emphasized   Positive antibodies  will stay  life long     3. Hyperlipidemia  : TC- 225; TG-235; HDL -50  and  LDL 133 - she is not diabetic and is not on meds     4. Slightly low vit D : OTC vit d supplements     Follow up PRN      Reviewed results with patient and discussed  the labs being ordered today/bnv  Patient voiced understanding of plan of care

## 2020-11-28 ENCOUNTER — Encounter

## 2020-12-01 MED ORDER — LEVOTHYROXINE 112 MCG TAB
112 mcg | ORAL_TABLET | ORAL | 0 refills | Status: DC
Start: 2020-12-01 — End: 2021-03-02

## 2020-12-25 ENCOUNTER — Ambulatory Visit: Admit: 2020-12-25 | Payer: BLUE CROSS/BLUE SHIELD | Attending: Family | Primary: Family

## 2020-12-25 DIAGNOSIS — R159 Full incontinence of feces: Secondary | ICD-10-CM

## 2020-12-25 MED ORDER — LOPERAMIDE 2 MG TAB
2 mg | ORAL_TABLET | Freq: Four times a day (QID) | ORAL | 1 refills | Status: AC | PRN
Start: 2020-12-25 — End: 2021-01-04

## 2020-12-25 NOTE — Progress Notes (Signed)
 1. Have you been to the ER, urgent care clinic since your last visit?  Hospitalized since your last visit?No    2. Have you seen or consulted any other health care providers outside of the [facility] since your last visit?  Include any pap smears or colon screening. No    Chief Complaint   Patient presents with    Follow-up    GI Problem     Health Maintenance Due   Topic Date Due    DTaP/Tdap/Td series (1 - Tdap) Never done    Shingrix Vaccine Age 58> (1 of 2) Never done    COVID-19 Vaccine (3 - Booster for Pfizer series) 03/30/2019    Flu Vaccine (1) Never done     3 most recent PHQ Screens 12/25/2020   Little interest or pleasure in doing things Not at all   Feeling down, depressed, irritable, or hopeless Not at all   Total Score PHQ 2 0   Trouble falling or staying asleep, or sleeping too much -   Feeling tired or having little energy -   Poor appetite, weight loss, or overeating -   Feeling bad about yourself - or that you are a failure or have let yourself or your family down -   Trouble concentrating on things such as school, work, reading, or watching TV -   Moving or speaking so slowly that other people could have noticed; or the opposite being so fidgety that others notice -   Thoughts of being better off dead, or hurting yourself in some way -   PHQ 9 Score -   How difficult have these problems made it for you to do your work, take care of your home and get along with others -     Visit Vitals  BP 128/82 (BP 1 Location: Left upper arm, BP Patient Position: Sitting, BP Cuff Size: Adult)   Pulse 71   Temp 97.6 F (36.4 C) (Skin)   Resp 16   Ht 5' 7 (1.702 m)   Wt 171 lb (77.6 kg)   SpO2 98%   BMI 26.78 kg/m

## 2020-12-25 NOTE — Progress Notes (Signed)
Assessment/Plan:     Diagnoses and all orders for this visit:    1. Incontinence of feces, unspecified fecal incontinence type  -     REFERRAL TO COLON AND RECTAL SURGERY  -     loperamide (IMMODIUM) 2 mg tablet; Take 1 Tablet by mouth four (4) times daily as needed for Diarrhea for up to 10 days.  - Worsening, advised to start probiotics, immodium as needed and ref to colo-rectal specialist for further evaluation and treatment.  RTC for continued symptoms    2. Pain of upper abdomen  -     H PYLORI AB, IGA; Future  -     H PYLORI AB, IGG, QT; Future  -     H PYLORI AB, IGM; Future  -     LIPASE; Future  -     AMYLASE; Future  - Unchanged, labs today.  Advised to get second opinion and make an apt with different provider at GI group          Discussed expected course/resolution/complications of diagnosis in detail with patient.    Medication risks/benefits/costs/interactions/alternatives discussed with patient.    Pt was given after visit summary which includes diagnoses, current medications & vitals.   Pt expressed understanding with the diagnosis and plan        Subjective:      Jacqueline Mckinney is a 55 y.o. female who presents for had concerns including Follow-up and GI Problem.     States she is unable to hold her stool  States stool is normal and formed  Denies diarrhea, bloody stools  States horrible flatus  States diet has not changed in the last 4 months  Not taking probiotics     Current Outpatient Medications   Medication Sig Dispense Refill    minoxidiL (LONITEN) 2.5 mg tablet       loperamide (IMMODIUM) 2 mg tablet Take 1 Tablet by mouth four (4) times daily as needed for Diarrhea for up to 10 days. 30 Tablet 1    levothyroxine (SYNTHROID) 112 mcg tablet TAKE 1 TABLET BY MOUTH ONCE DAILY BEFORE BREAKFAST 90 Tablet 0    linaclotide (LINZESS PO) TAKE 1 CAPSULE BY MOUTH ONCE DAILY FOR 30 DAYS      hydroCHLOROthiazide (HYDRODIURIL) 25 mg tablet Take 1 tablet by mouth once daily 90 Tablet 1    pantoprazole  (PROTONIX) 40 mg tablet TAKE 1 TABLET BY MOUTH ONCE DAILY BEFORE BREAKFAST FOR 60 DAYS      azelastine (ASTELIN) 137 mcg (0.1 %) nasal spray USE 1 TO 2 SPRAY(S) IN EACH NOSTRIL EVERY 12 HOURS AS NEEDED      loratadine (CLARITIN) 10 mg tablet Take 1 Tablet by mouth daily. 90 Tablet 1    Nebulizer Accessories kit Use as needed for shortness of breath 1 Kit 0    Nebulizer & Compressor machine Use as needed for shortness of breath 1 Each 0    albuterol (PROVENTIL HFA, VENTOLIN HFA, PROAIR HFA) 90 mcg/actuation inhaler Take 1 Puff by inhalation every four (4) hours as needed for Wheezing or Shortness of Breath. 1 Inhaler 5    albuterol (PROVENTIL VENTOLIN) 2.5 mg /3 mL (0.083 %) nebu 1.5 mL by Nebulization route every four (4) hours as needed for Shortness of Breath. 30 Nebule 5    cholecalciferol (VITAMIN D3) (1000 Units /25 mcg) tablet Take 2 Tabs by mouth daily. 180 Tab 1    fluticasone propionate (FLONASE) 50 mcg/actuation nasal spray Use 2 spray(s) in  each nostril once daily 16 g 0    predniSONE (STERAPRED DS) 10 mg dose pack Take 10 mg by mouth as needed. 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[facility]    HX TONSIL AND ADENOIDECTOMY      PR EGD DELIVER THERMAL ENERGY SPHNCTR/CARDIA GERD       Family History   Problem Relation Age of Onset    Diabetes Mother      Social History     Socioeconomic History    Marital status: SINGLE     Spouse name: Not on file    Number of children: Not on file    Years of education: Not on file    Highest education level: Not on file   Occupational History    Not on file   Tobacco Use    Smoking status:  Never    Smokeless tobacco: Never   Vaping Use    Vaping Use: Never used   Substance and Sexual Activity    Alcohol use: No    Drug use: No    Sexual activity: Never   Other Topics Concern    Not on file   Social History Narrative    Not on file     Social Determinants of Health     Financial Resource Strain: Not on file   Food Insecurity: Not on file   Transportation Needs: Not on file   Physical Activity: Not on file   Stress: Not on file   Social Connections: Not on file   Intimate Partner Violence: Not on file   Housing Stability: Not on file       HPI      ROS:   Review of Systems   Constitutional:  Negative for chills, fever and malaise/fatigue.   Eyes:  Negative for blurred vision.   Respiratory:  Negative for cough and shortness of breath.    Cardiovascular:  Negative for chest pain, palpitations and leg swelling.   Gastrointestinal:  Positive for abdominal pain and diarrhea. Negative for blood in stool, constipation, heartburn, melena, nausea and vomiting.   Neurological:  Negative for dizziness and headaches.     Objective:   Visit Vitals  BP 128/82 (BP 1 Location: Left upper arm, BP Patient Position: Sitting, BP Cuff Size: Adult)   Pulse 71   Temp 97.6 ??F (36.4 ??C) (Skin)   Resp 16   Ht 5' 7" (1.702 m)   Wt 171 lb (77.6 kg)   SpO2 98%   BMI 26.78 kg/m??         Vitals and Nurse Documentation reviewed.     Physical Exam  Constitutional:       General: She is not in acute distress.     Appearance: She is not diaphoretic.   HENT:      Head: Normocephalic and atraumatic.   Cardiovascular:      Rate and Rhythm: Normal rate and regular rhythm.  Heart sounds: Normal heart sounds. No murmur heard.    No friction rub. No gallop.   Pulmonary:      Effort: Pulmonary effort is normal. No respiratory distress.      Breath sounds: Normal breath sounds. No wheezing or rales.   Abdominal:      General: Bowel sounds are normal.      Palpations: There is no hepatomegaly or splenomegaly.      Tenderness: There is no  abdominal tenderness. There is no guarding or rebound. Negative signs include Murphy's sign.      Hernia: There is no hernia in the umbilical area.   Skin:     General: Skin is warm and dry.      Coloration: Skin is not pale.   Neurological:      Mental Status: She is alert.       Results for orders placed or performed in visit on 12/25/20   AMYLASE   Result Value Ref Range    Amylase 31 25 - 115 U/L   LIPASE   Result Value Ref Range    Lipase 103 73 - 393 U/L   H PYLORI AB, IGM   Result Value Ref Range    H. pylori Ab, IgM <9.0 0.0 - 8.9 units   H PYLORI AB, IGG, QT   Result Value Ref Range    H. pylori Ab, IgG 0.34 0.00 - 0.79 Index Value   H PYLORI AB, IGA   Result Value Ref Range    H. pylori Ab, IgA <9.0 0.0 - 8.9 units

## 2020-12-25 NOTE — Addendum Note (Signed)
 Addendum  Note by Jakie Arenas C at 12/25/20 1000                Author: Jakie Arenas C  Service: --  Author Type: --       Filed: 04/14/21 1548  Encounter Date: 12/25/2020  Status: Signed          Editor: Jakie Arenas C          Addended by: JAKIE ARENAS BROCKS on: 04/14/2021 03:48 PM    Modules accepted: Level of Service

## 2020-12-26 ENCOUNTER — Encounter

## 2020-12-26 LAB — AMYLASE
Amylase: 31 U/L (ref 25–115)
Amylase: 31 U/L (ref 25–115)

## 2020-12-26 LAB — LIPASE
Lipase: 103 U/L (ref 73–393)
Lipase: 103 U/L (ref 73–393)

## 2020-12-28 LAB — H PYLORI AB, IGM
H. PYLORI AB, IGM: <9 units (ref 0.0–8.9)
H. pylori Ab, IgM: <9 units (ref 0.0–8.9)

## 2020-12-28 MED ORDER — FLUTICASONE 50 MCG/ACTUATION NASAL SPRAY, SUSP
50 mcg/actuation | NASAL | 0 refills | Status: AC
Start: 2020-12-28 — End: 2021-04-21

## 2020-12-29 LAB — H PYLORI AB, IGA
H pylori Ab, IgA: <9 units (ref 0.0–8.9)
H. pylori Ab, IgA: <9 units (ref 0.0–8.9)

## 2020-12-29 LAB — H PYLORI AB, IGG, QT
H. PYLORI AB, IGG: 0.34 Index Value (ref 0.00–0.79)
H. pylori Ab, IgG: 0.34 Index Value (ref 0.00–0.79)

## 2021-03-01 ENCOUNTER — Encounter

## 2021-03-02 MED ORDER — LEVOTHYROXINE 112 MCG TAB
112 mcg | ORAL_TABLET | ORAL | 0 refills | Status: AC
Start: 2021-03-02 — End: 2021-04-21

## 2021-04-20 NOTE — Telephone Encounter (Signed)
Location of patient: [place]    Received call from [place] at [facility] with [employer].    Subjective: Caller states "I have problems with my left arm"     Current Symptoms: left arm pain and bilateral leg cramps at night    Onset: "a long time and getting worse"      Associated Symptoms: reduced activity    Pain Severity: 8/10 for left arm and 10/10 in legs    Temperature: no     What has been tried: advil with no relief    LMP: NA Pregnant: NA    Recommended disposition: Go to ED Now. Pt refusing to go to ED at this time and would like an office appointment with her PCP. Provided with the risk of not following disposition and informed them that this is best practice.      Care advice provided, patient verbalizes understanding; denies any other questions or concerns; instructed to call back for any new or worsening symptoms.    Writer provided warm transfer to [place] at [facility] for appointment scheduling     Attention Provider:  Thank you for allowing me to participate in the care of your patient.  The patient was connected to triage in response to information provided to the ECC.  Please do not respond through this encounter as the response is not directed to a shared pool.        Reason for Disposition   [1] SEVERE pain AND [2] not improved 2 hours after pain medicine    Protocols used: Arm Pain-ADULT-AH

## 2021-04-21 ENCOUNTER — Ambulatory Visit
Admit: 2021-04-21 | Payer: BLUE CROSS/BLUE SHIELD | Attending: Family | Primary: Student in an Organized Health Care Education/Training Program

## 2021-04-21 ENCOUNTER — Ambulatory Visit: Attending: Family | Primary: Student in an Organized Health Care Education/Training Program

## 2021-04-21 DIAGNOSIS — I10 Essential (primary) hypertension: Secondary | ICD-10-CM

## 2021-04-21 MED ORDER — CETIRIZINE 10 MG TAB
10 mg | ORAL_TABLET | Freq: Every day | ORAL | 1 refills | Status: AC
Start: 2021-04-21 — End: ?

## 2021-04-21 MED ORDER — HYDROCHLOROTHIAZIDE 25 MG TAB
25 mg | ORAL_TABLET | Freq: Every day | ORAL | 1 refills | Status: AC
Start: 2021-04-21 — End: 2021-07-20

## 2021-04-21 MED ORDER — FLUTICASONE 50 MCG/ACTUATION NASAL SPRAY, SUSP
50 mcg/actuation | Freq: Every day | NASAL | 2 refills | Status: AC
Start: 2021-04-21 — End: ?

## 2021-04-21 MED ORDER — LEVOTHYROXINE 112 MCG TAB
112 mcg | ORAL_TABLET | Freq: Every day | ORAL | 1 refills | Status: AC
Start: 2021-04-21 — End: 2021-07-20

## 2021-04-21 MED ORDER — MELOXICAM 15 MG TAB
15 mg | ORAL_TABLET | Freq: Every day | ORAL | 0 refills | Status: AC
Start: 2021-04-21 — End: ?

## 2021-04-21 NOTE — Progress Notes (Signed)
Assessment/Plan:     Diagnoses and all orders for this visit:    1. Essential hypertension  -     hydroCHLOROthiazide (HYDRODIURIL) 25 mg tablet; Take 1 Tablet by mouth daily for 90 days.  Seen in office for medication refill for blood pressure.  2. Pain in both lower extremities  Seen for leg cramps bilateral, lower extremity lower leg.  Reports waking up at night with leg cramping to the point where she has to get up out of the bed and stretch.  Patient is on diuretic blood pressure medication.  Metabolic panel today to check potassium and electrolytes.  Discussed need to drink more water and stretch daily.  3. Primary hypothyroidism  -     levothyroxine (SYNTHROID) 112 mcg tablet; Take 1 Tablet by mouth Daily (before breakfast) for 90 days.  -     TSH 3RD GENERATION; Future  Medication refill for levothyroxine.  Is in need of updated lab  4. Allergic rhinitis, unspecified seasonality, unspecified trigger  -     cetirizine (ZYRTEC) 10 mg tablet; Take 1 Tablet by mouth daily. Indications: inflammation of the nose due to an allergy  -     fluticasone propionate (FLONASE) 50 mcg/actuation nasal spray; 2 Sprays by Both Nostrils route daily. Indications: inflammation of the nose due to an allergy  History of seasonal allergies that have been worsening.  Medication changed and refilled.  5. Leg cramping  -     METABOLIC PANEL, COMPREHENSIVE; Future  -     meloxicam (MOBIC) 15 mg tablet; Take 1 Tablet by mouth daily.    6. Prediabetes  -     HEMOGLOBIN A1C WITH EAG; Future  History of slightly elevated A1c we will check again today due to complaints of neuropathic type pain in the legs.      Follow-up and Dispositions    Return in about 2 weeks (around 05/05/2021), or if symptoms worsen or fail to improve.         Discussed expected course/resolution/complications of diagnosis in detail with patient.    Medication risks/benefits/costs/interactions/alternatives discussed with patient.    Pt was given after visit summary  which includes diagnoses, current medications & vitals.   Pt expressed understanding with the diagnosis and plan        Subjective:      Jacqueline Mckinney is a 56 y.o. female who presents for had concerns including Arm Pain (Left arm pain) and Leg Pain (B/L).     Current Outpatient Medications   Medication Sig Dispense Refill   . hydroCHLOROthiazide (HYDRODIURIL) 25 mg tablet Take 1 Tablet by mouth daily for 90 days. 90 Tablet 1   . levothyroxine (SYNTHROID) 112 mcg tablet Take 1 Tablet by mouth Daily (before breakfast) for 90 days. 90 Tablet 1   . cetirizine (ZYRTEC) 10 mg tablet Take 1 Tablet by mouth daily. Indications: inflammation of the nose due to an allergy 90 Tablet 1   . fluticasone propionate (FLONASE) 50 mcg/actuation nasal spray 2 Sprays by Both Nostrils route daily. Indications: inflammation of the nose due to an allergy 16 g 2   . meloxicam (MOBIC) 15 mg tablet Take 1 Tablet by mouth daily. 20 Tablet 0   . Nebulizer Accessories kit Use as needed for shortness of breath 1 Kit 0   . Nebulizer & Compressor machine Use as needed for shortness of breath   1 Each 0   . albuterol (PROVENTIL HFA, VENTOLIN HFA, PROAIR HFA) 90 mcg/actuation inhaler Take 1  Puff by inhalation every four (4) hours as needed for Wheezing or Shortness of Breath. 1 Inhaler 5   . albuterol (PROVENTIL VENTOLIN) 2.5 mg /3 mL (0.083 %) nebu 1.5 mL by Nebulization route every four (4) hours as needed for Shortness of Breath. 30 Nebule 5   . cholecalciferol (VITAMIN D3) (1000 Units /25 mcg) tablet Take 2 Tabs by mouth daily. 180 Tab 1   . azelastine (ASTELIN) 137 mcg (0.1 %) nasal spray USE 1 TO 2 SPRAY(S) IN EACH NOSTRIL EVERY 12 HOURS AS NEEDED (Patient not taking: Reported on 04/21/2021)         No Known Allergies  Past Medical History:   Diagnosis Date   .   H/O seasonal allergies    . Hyperlipidemia    . Hypertension    . Hypothyroid    . Ill-defined condition 2005    ependyoma resection  2005, s/p radiation at [facility]   . Impaired glucose  tolerance      Past Surgical History:   Procedure Laterality Date   . HX OTHER SURGICAL      ependyoma resection  2005, s/p radiation at [facility]   . HX TONSIL AND ADENOIDECTOMY     . PR EGD DELIVER THERMAL ENERGY SPHNCTR/CARDIA GERD       Family History   Problem Relation Age of Onset   . Diabetes Mother      Social History     Socioeconomic History   . Marital status: SINGLE     Spouse name: Not on file   . Number of children: Not on file   . Years of education: Not on file   . Highest education level: Not on file   Occupational History   . Not on file   Tobacco Use   . Smoking status: Never   . Smokeless tobacco: Never   Vaping Use   . Vaping Use: Never used   Substance and Sexual Activity   . Alcohol use: No   . Drug use: No   . Sexual activity: Never   Other Topics Concern   . Not on file   Social History Narrative   . Not on file     Social Determinants of Health     Financial Resource Strain: Medium Risk   . Difficulty of Paying Living Expenses: Somewhat hard   Food Insecurity: Food Insecurity Present   . Worried About Charity fundraiser in the Last Year: Sometimes true   . Ran Out of Food in the Last Year: Sometimes true   Transportation Needs: No Transportation Needs   . Lack of Transportation (Medical): No   . Lack of Transportation (Non-Medical): No   Physical Activity: Unknown   . Days of Exercise per Week: Patient refused   . Minutes of Exercise per Session: Patient refused   Stress: Not on file   Social Connections: Not on file   Intimate Partner Violence: Not on file   Housing Stability: Unknown   . Unable to Pay for Housing in the Last Year: No   . Number of Places Lived in the Last Year: Not on file   . Unstable Housing in the Last Year: No       HPI      ROS:   Review of Systems   Constitutional:  Negative for fever and malaise/fatigue.   HENT:  Negative for congestion, sinus pain and sore throat.    Eyes: Negative.    Respiratory:  Negative for cough  and shortness of breath.    Cardiovascular:   Negative for chest pain.   Gastrointestinal:  Negative for diarrhea, nausea and vomiting.   Genitourinary:  Negative for dysuria.   Musculoskeletal:  Positive for myalgias.   Skin: Negative.    Neurological:  Negative for dizziness and headaches.   Endo/Heme/Allergies:  Negative for environmental allergies.   Psychiatric/Behavioral: Negative.       Objective:   Visit Vitals  BP 116/79 (BP 1 Location: Left upper arm, BP Patient Position: Sitting, BP Cuff Size: Adult)   Pulse 87   Temp 97.2 F (36.2 C) (Skin)   Resp 16   Ht '5\' 7"'  (1.702 m)   Wt 168 lb (76.2 kg)   SpO2 97%   BMI 26.31 kg/m         Vitals and Nurse Documentation reviewed.     Physical Exam  Vitals and nursing note reviewed.   Constitutional:       General: She is not in acute distress.     Appearance: Normal appearance.   HENT:      Head: Normocephalic and atraumatic.      Nose: Nose normal.      Mouth/Throat:      Mouth: Mucous membranes are moist.   Eyes:      Pupils: Pupils are equal, round, and reactive to light.   Cardiovascular:      Rate and Rhythm: Normal rate and regular rhythm.      Pulses: Normal pulses.      Heart sounds: Normal heart sounds.   Pulmonary:      Effort: Pulmonary effort is normal.      Breath sounds: Normal breath sounds.   Abdominal:      General: Abdomen is flat.   Musculoskeletal:         General: Tenderness present. Normal range of motion.      Cervical back: Normal range of motion.   Skin:     General: Skin is warm and dry.      Capillary Refill: Capillary refill takes less than 2 seconds.   Neurological:      General: No focal deficit present.      Mental Status: She is alert and oriented to person, place, and time. Mental status is at baseline.   Psychiatric:         Mood and Affect: Mood normal.         Behavior: Behavior normal.     Results for orders placed or performed in visit on 03/11/21   AMB EXT LDL-C   Result Value Ref Range    LDL-C, External 133

## 2021-04-21 NOTE — Progress Notes (Signed)
1. Have you been to the ER, urgent care clinic since your last visit?  Hospitalized since your last visit?No    2. Have you seen or consulted any other health care providers outside of the [facility] since your last visit?  Include any pap smears or colon screening. No    Health Maintenance Due   Topic Date Due    DTaP/Tdap/Td series (1 - Tdap) Never done    Shingles Vaccine (1 of 2) Never done    COVID-19 Vaccine (3 - Booster for Pfizer series) 03/30/2019     Abuse Screening Questionnaire 04/21/2021   Do you ever feel afraid of your partner? N   Are you in a relationship with someone who physically or mentally threatens you? N   Is it safe for you to go home? Y     3 most recent PHQ Screens 04/21/2021   Little interest or pleasure in doing things Not at all   Feeling down, depressed, irritable, or hopeless Not at all   Total Score PHQ 2 0   Trouble falling or staying asleep, or sleeping too much Not at all   Feeling tired or having little energy Not at all   Poor appetite, weight loss, or overeating Not at all   Feeling bad about yourself - or that you are a failure or have let yourself or your family down Not at all   Trouble concentrating on things such as school, work, reading, or watching TV Not at all   Moving or speaking so slowly that other people could have noticed; or the opposite being so fidgety that others notice Not at all   Thoughts of being better off dead, or hurting yourself in some way Not at all   PHQ 9 Score 0   How difficult have these problems made it for you to do your work, take care of your home and get along with others Not difficult at all     Learning Assessment 01/13/2017   PRIMARY LEARNER Patient   PRIMARY LANGUAGE SPANISH   LEARNER PREFERENCE PRIMARY DEMONSTRATION   ANSWERED BY patient   RELATIONSHIP SELF     Chief Complaint   Patient presents with    Arm Pain     Left arm pain    Leg Pain     B/L     Visit Vitals  BP 116/79 (BP 1 Location: Left upper arm, BP Patient  Position: Sitting, BP Cuff Size: Adult)   Pulse 87   Temp 97.2 F (36.2 C) (Skin)   Resp 16   Ht 5\' 7"  (1.702 m)   Wt 168 lb (76.2 kg)   SpO2 97%   BMI 26.31 kg/m

## 2021-04-22 LAB — METABOLIC PANEL, COMPREHENSIVE
A-G Ratio: 1.3 (ref 1.1–2.2)
ALT (SGPT): 43 U/L (ref 12–78)
AST (SGOT): 33 U/L (ref 15–37)
Albumin: 4.1 g/dL (ref 3.5–5.0)
Alk. phosphatase: 91 U/L (ref 45–117)
Anion gap: 3 mmol/L — ABNORMAL LOW (ref 5–15)
BUN/Creatinine ratio: 27 — ABNORMAL HIGH (ref 12–20)
BUN: 20 MG/DL (ref 6–20)
Bilirubin, total: 1.1 MG/DL — ABNORMAL HIGH (ref 0.2–1.0)
CO2: 29 mmol/L (ref 21–32)
Calcium: 9.4 MG/DL (ref 8.5–10.1)
Chloride: 107 mmol/L (ref 97–108)
Creatinine: 0.73 MG/DL (ref 0.55–1.02)
Globulin: 3.2 g/dL (ref 2.0–4.0)
Glucose: 93 mg/dL (ref 65–100)
Potassium: 3.9 mmol/L (ref 3.5–5.1)
Protein, total: 7.3 g/dL (ref 6.4–8.2)
Sodium: 139 mmol/L (ref 136–145)
eGFR: >60 mL/min/{1.73_m2} (ref >60)

## 2021-04-22 LAB — HEMOGLOBIN A1C WITH EAG
Est. average glucose: 117 mg/dL
Hemoglobin A1c: 5.7 % — ABNORMAL HIGH (ref 4.0–5.6)

## 2021-04-22 LAB — TSH 3RD GENERATION
TSH: 4.36 u[IU]/mL — ABNORMAL HIGH (ref 0.36–3.74)
TSH: 4.36 u[IU]/mL — ABNORMAL HIGH (ref 0.36–3.74)

## 2021-04-22 LAB — COMPREHENSIVE METABOLIC PANEL
ALT: 43 U/L (ref 12–78)
AST: 33 U/L (ref 15–37)
Albumin/Globulin Ratio: 1.3 (ref 1.1–2.2)
Albumin: 4.1 g/dL (ref 3.5–5.0)
Alkaline Phosphatase: 91 U/L (ref 45–117)
Anion Gap: 3 mmol/L — ABNORMAL LOW (ref 5–15)
BUN: 20 MG/DL (ref 6–20)
Bun/Cre Ratio: 27 — ABNORMAL HIGH (ref 12–20)
CO2: 29 mmol/L (ref 21–32)
Calcium: 9.4 MG/DL (ref 8.5–10.1)
Chloride: 107 mmol/L (ref 97–108)
Creatinine: 0.73 MG/DL (ref 0.55–1.02)
ESTIMATED GLOMERULAR FILTRATION RATE: >60 mL/min/{1.73_m2} (ref >60)
Globulin: 3.2 g/dL (ref 2.0–4.0)
Glucose: 93 mg/dL (ref 65–100)
Potassium: 3.9 mmol/L (ref 3.5–5.1)
Sodium: 139 mmol/L (ref 136–145)
Total Bilirubin: 1.1 MG/DL — ABNORMAL HIGH (ref 0.2–1.0)
Total Protein: 7.3 g/dL (ref 6.4–8.2)

## 2021-04-22 LAB — HEMOGLOBIN A1C W/EAG
Hemoglobin A1C: 5.7 % — ABNORMAL HIGH (ref 4.0–5.6)
eAG: 117 mg/dL

## 2021-05-04 NOTE — Telephone Encounter (Signed)
Telephone Encounter by Davene Costain, RN at 05/04/21 0945                Author: Davene Costain, RN  Service: --  Author Type: Registered Nurse       Filed: 05/04/21 0958  Encounter Date: 05/04/2021  Status: Signed          Editor: Zwitt, Danielle, RN (Registered Nurse)               Location of patient: [place]      Received call from [place] at [facility] with [employer].      Subjective: Caller states "I'm feeling down for a couple days. I have pain in my leg and my two legs. I pee, I have to run to the bathroom and  it's too late. They do the blood test for me, one week ago and they send the paper for me. They say my glucose is very high and I need to call the doctor to make an appointment."       Current Symptoms: bilateral leg pain, urinary frequency/incontinence x 1 week, headache, dizziness, fatigue, NO difficulty breathing or chest pain,  lower back pain, unable to check blood sugar currently      Patient was seen in office on 4/12 for leg pain and elevated blood sugar      Onset: 1 week ago; unchanged      Associated Symptoms: reduced activity      Pain Severity: 8/10; leg pain; constant      Temperature: Denies      What has been tried: Nothing      LMP: NA Pregnant: NA      Recommended disposition: See in Office Today. Patient agreeable.      Care advice provided, patient verbalizes understanding; denies any other questions or concerns; instructed to call back for any new or worsening symptoms.      Patient/Caller agrees with recommended disposition; Clinical research associate provided warm transfer to [place] at [facility] for appointment scheduling.      Attention Provider:  Thank you for allowing me to participate in the care of your patient.  The patient was connected to triage in response to information provided to the ECC.  Please do not respond through this encounter as the response is not directed  to a shared pool.      Reason for Disposition  ? Side  (flank) or lower back pain present    Protocols  used: Urinary Symptoms-ADULT-[place]

## 2021-05-09 IMAGING — MG DIGITAL SCREENING BILAT W/ TOMO W/ CAD
8 series · 8 of 24 positions shown · non-contrast
Comparison: Previous exam(s).

ACR Breast Density Category a: The breast tissue is almost entirely
fatty.

CLINICAL DATA: Screening.

EXAM:
DIGITAL SCREENING BILATERAL MAMMOGRAM WITH TOMO AND CAD

[R CC synth-2D]
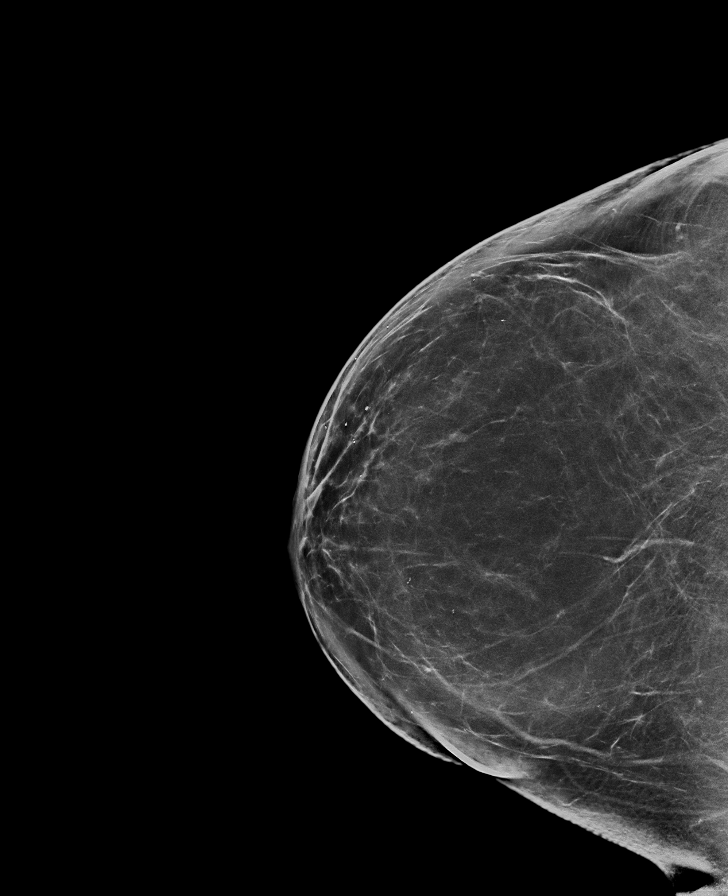

[R MLO synth-2D]
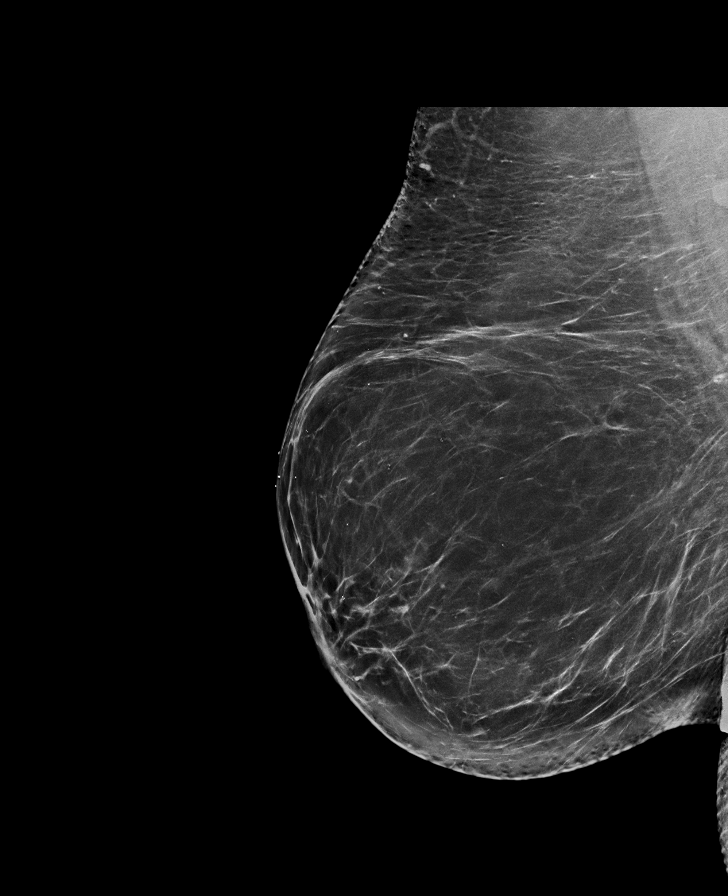

[L MLO synth-2D]
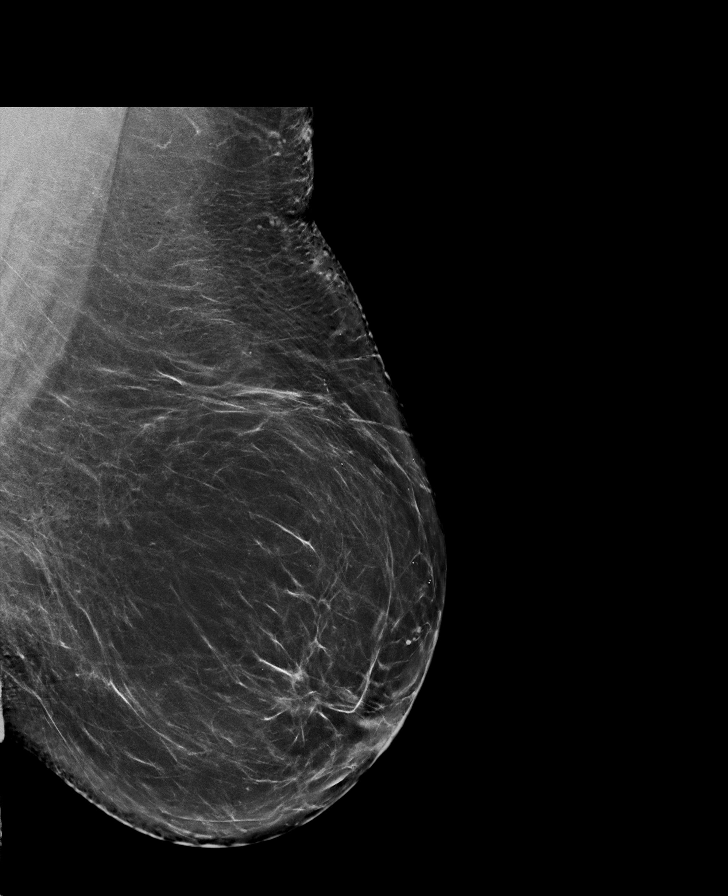

[L CC synth-2D]
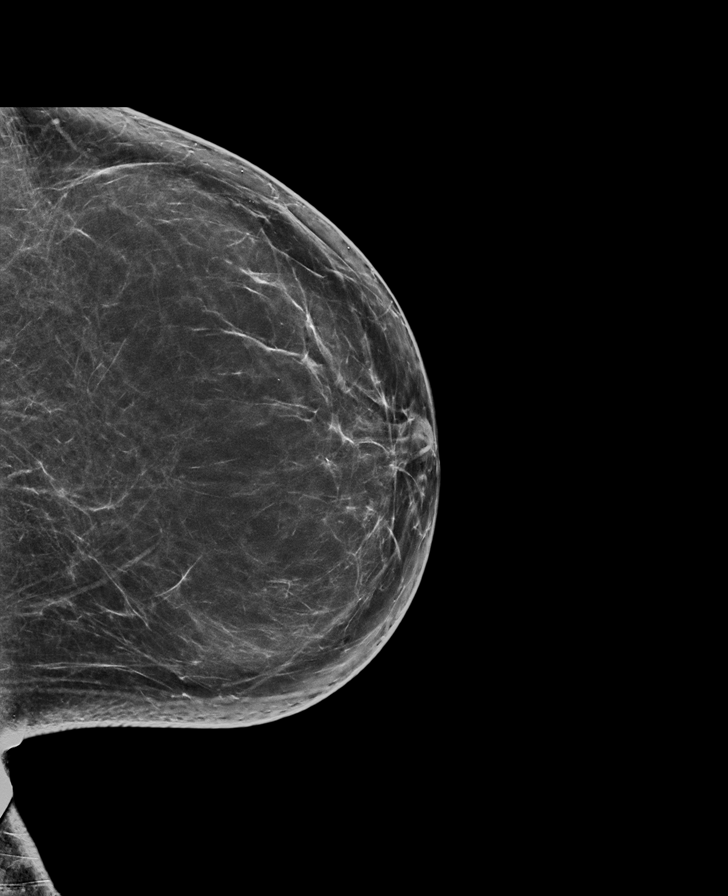

[R CC tomo · tomo slice 45/88.0]
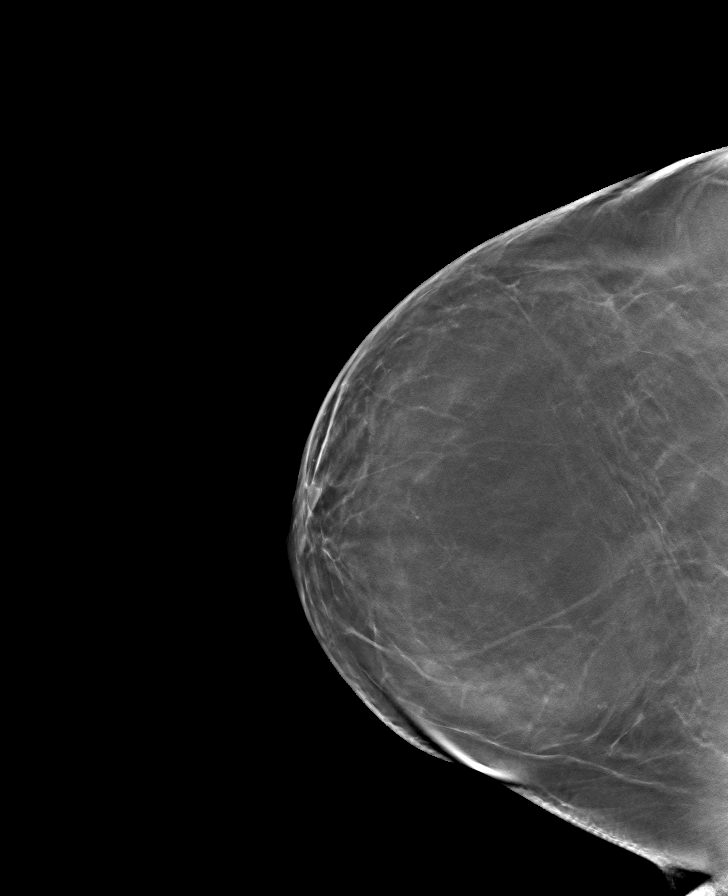

[L MLO tomo · tomo slice 49/96.0]
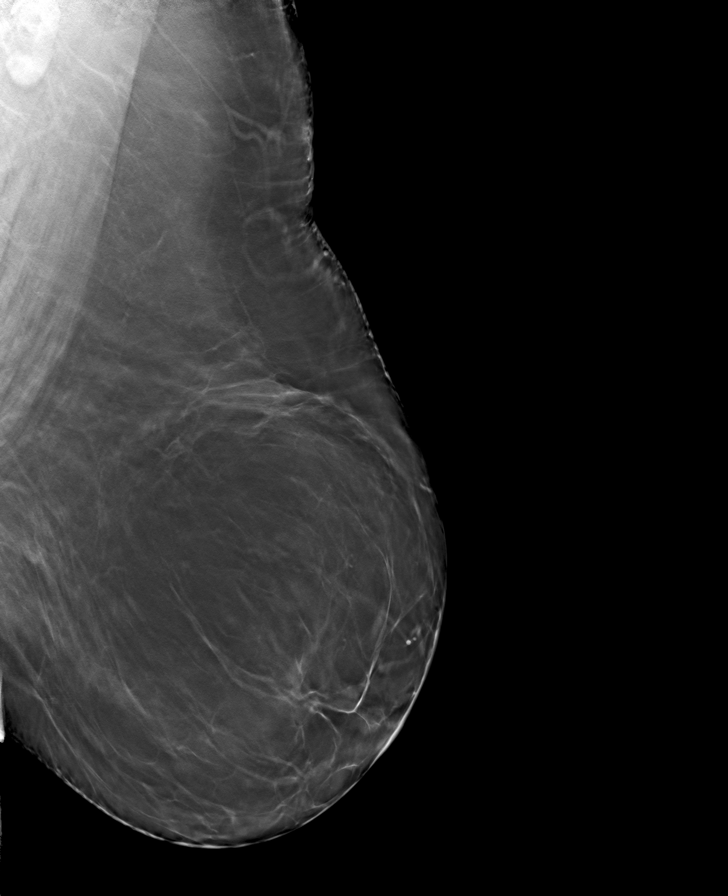

[L CC tomo · tomo slice 40/79.0]
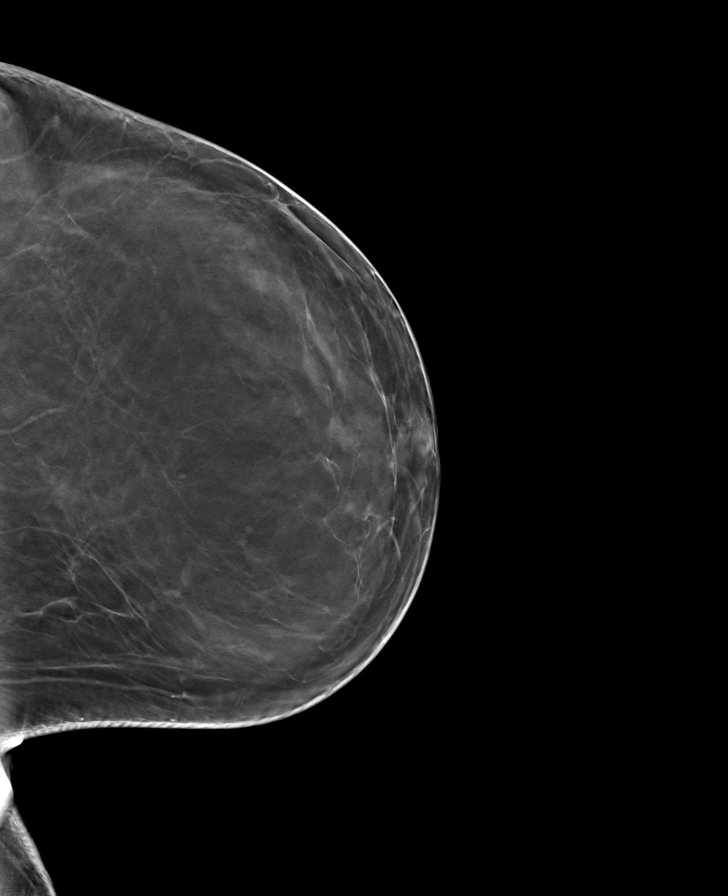

[R MLO tomo · tomo slice 46/91.0]
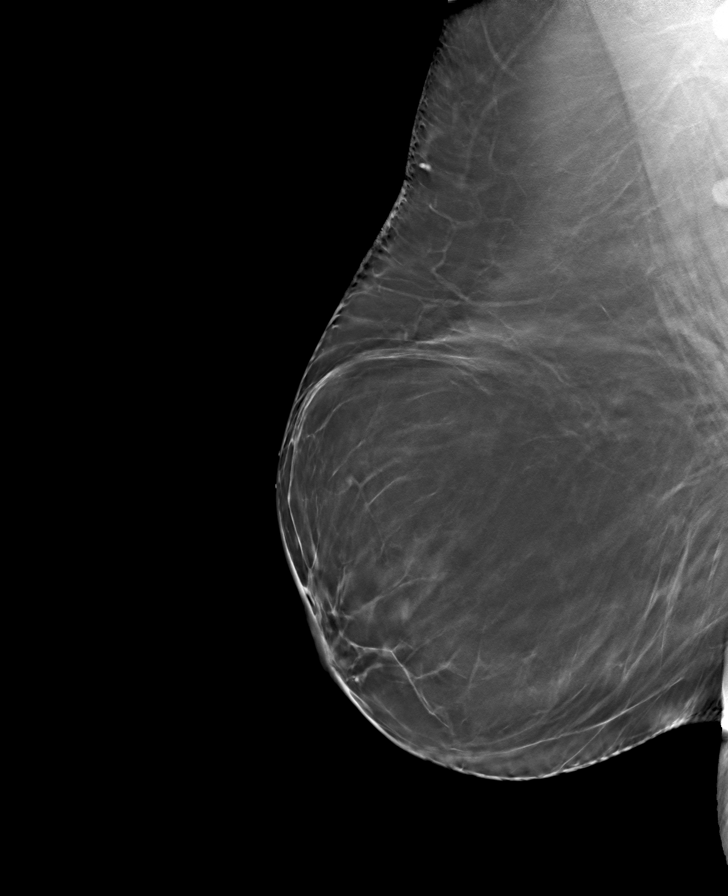

[8 of 24 positions shown; findings below may reference images not displayed]

FINDINGS: There are no findings suspicious for malignancy. Images were
processed with CAD.
IMPRESSION: No mammographic evidence of malignancy. A result letter of this
screening mammogram will be mailed directly to the patient.

RECOMMENDATION:
Screening mammogram in one year. (Code:8Y-[id number])

BI-RADS CATEGORY  1: Negative.

## 2021-05-10 NOTE — Telephone Encounter (Signed)
Pt stated BP  high. 147/87. Pt stated she is going to ER  then will see PCP Friday

## 2021-05-14 ENCOUNTER — Ambulatory Visit
Admit: 2021-05-14 | Discharge: 2021-05-14 | Payer: BLUE CROSS/BLUE SHIELD | Attending: Student in an Organized Health Care Education/Training Program | Primary: Student in an Organized Health Care Education/Training Program

## 2021-05-14 ENCOUNTER — Ambulatory Visit
Attending: Student in an Organized Health Care Education/Training Program | Primary: Student in an Organized Health Care Education/Training Program

## 2021-05-14 DIAGNOSIS — I10 Essential (primary) hypertension: Secondary | ICD-10-CM

## 2021-05-14 LAB — VITAMIN B12 & FOLATE
Folate: 18.7 ng/mL (ref 5.0–21.0)
Folate: 18.7 ng/mL (ref 5.0–21.0)
Vitamin B-12: 454 pg/mL (ref 193–986)
Vitamin B12: 454 pg/mL (ref 193–986)

## 2021-05-14 LAB — TSH 3RD GENERATION
TSH: 7.99 u[IU]/mL — ABNORMAL HIGH (ref 0.36–3.74)
TSH: 7.99 u[IU]/mL — ABNORMAL HIGH (ref 0.36–3.74)

## 2021-05-14 LAB — VITAMIN D, 25 HYDROXY: Vitamin D 25-Hydroxy: 15.3 ng/mL — ABNORMAL LOW (ref 30–100)

## 2021-05-14 LAB — VITAMIN D 25 HYDROXY: Vit D, 25-Hydroxy: 15.3 ng/mL — ABNORMAL LOW (ref 30–100)

## 2021-05-14 NOTE — Progress Notes (Signed)
Progress Notes by [name], MD at 05/14/21 1000                Author: Vernell Barrier Eliberto Ivory, MD  Service: --  Author Type: Physician       Filed: 05/14/21 1356  Encounter Date: 05/14/2021  Status: Signed          Editor: Vernell Barrier Eliberto Ivory, MD (Physician)                 Assessment/Plan:        Diagnoses and all orders for this visit:      1. Essential hypertension   -Blood pressure in the emergency room earlier this week was significantly elevated   -However current BP is within normal limits.   -Therefore especially in the setting of dizziness will continue on current dosage of hydrochlorothiazide and not make any acute changes at this time   -Patient advised to monitor her home blood pressure readings      2. Paresthesia of bilateral legs   -     VITAMIN B12 & FOLATE; Future   -     REFERRAL TO NEUROLOGY   -Chronic.  Persistent and noted in bilateral lower legs   -Pain described does sound like a neuropathic pain   -Patient discharged with gabapentin 100 mg 3 times daily from ED.  Advised patient to continue on his current dosage to see if it ultimately will provide benefit   -Also recommend additional lab work and referral to neurology for further evaluation      3. Lightheadedness   -     REFERRAL TO NEUROLOGY   -Chronic. Noted over the past month.   -CT head and lab work completed in the ED were unremarkable.   -EKG in ED noted normal sinus rhythm with no acute ischemic changes   -Orthostatics in office today within normal limits   -Uncertain etiology therefore advised patient to follow-up with neurology      4. Primary hypothyroidism   -     TSH 3RD GENERATION; Future   -Patient has been feeling significant fatigue and previously had a slightly elevated TSH   -Repeat TSH level today   -Continue on current dosage levothyroxine      5. Vitamin D deficiency   -     VITAMIN D, 25 HYDROXY; Future   -Patient has a previous history of vitamin d deficiency.   -Given symptoms of fatigue will  repeat vitamin D level today              Follow-up and Dispositions        Return in about 4 weeks (around 06/11/2021), or if symptoms worsen or fail to improve.                Discussed expected course/resolution/complications of diagnosis in detail with patient.     Medication risks/benefits/costs/interactions/alternatives discussed with patient.     Pt expressed understanding with the diagnosis and plan.        Subjective:         Jacqueline Mckinney is a 56 y.o. female who presents for had concerns including Leg Pain (For x2 months; Bilateral leg pain ) and Dizziness (For x1 month).         Current Outpatient Medications          Medication  Sig  Dispense  Refill           ?  butalbital-acetaminophen-caffeine (FIORICET, ESGIC) 50-325-40 mg per tablet  Take 1 Tablet by mouth.         ?  gabapentin (NEURONTIN) 100 mg capsule  Take  by mouth three (3) times daily.         ?  hydroCHLOROthiazide (HYDRODIURIL) 25 mg tablet  Take 1 Tablet by mouth daily for 90 days.  90 Tablet  1     ?  levothyroxine (SYNTHROID) 112 mcg tablet  Take 1 Tablet by mouth Daily (before breakfast) for 90 days.  90 Tablet  1     ?  cetirizine (ZYRTEC) 10 mg tablet  Take 1 Tablet by mouth daily. Indications: inflammation of the nose due to an allergy  90 Tablet  1     ?  fluticasone propionate (FLONASE) 50 mcg/actuation nasal spray  2 Sprays by Both Nostrils route daily. Indications: inflammation of the nose due to an allergy  16 g  2     ?  meloxicam (MOBIC) 15 mg tablet  Take 1 Tablet by mouth daily.  20 Tablet  0           ?  cholecalciferol (VITAMIN D3) (1000 Units /25 mcg) tablet  Take 2 Tabs by mouth daily.  180 Tab  1           No Known Allergies     Past Medical History:        Diagnosis  Date         ?  H/O seasonal allergies       ?  Hyperlipidemia       ?  Hypertension       ?  Hypothyroid       ?  Ill-defined condition  2005          ependyoma resection  2005, s/p radiation at [facility]         ?  Impaired glucose tolerance             Past Surgical History:         Procedure  Laterality  Date          ?  HX OTHER SURGICAL              ependyoma resection  2005, s/p radiation at [facility]          ?  HX TONSIL AND ADENOIDECTOMY              ?  PR EGD DELIVER THERMAL ENERGY SPHNCTR/CARDIA GERD              Family History         Problem  Relation  Age of Onset          ?  Diabetes  Mother            Social History          Socioeconomic History         ?  Marital status:  SINGLE              Spouse name:  Not on file         ?  Number of children:  Not on file     ?  Years of education:  Not on file     ?  Highest education level:  Not on file       Occupational History        ?  Not on file       Tobacco Use         ?  Smoking status:  Never     ?  Smokeless tobacco:  Never       Vaping Use         ?  Vaping Use:  Never used       Substance and Sexual Activity         ?  Alcohol use:  No     ?  Drug use:  No     ?  Sexual activity:  Never        Other Topics  Concern        ?  Not on file       Social History Narrative        ?  Not on file          Social Determinants of Health          Financial Resource Strain: Medium Risk        ?  Difficulty of Paying Living Expenses: Somewhat hard       Food Insecurity: Food Insecurity Present        ?  Worried About Programme researcher, broadcasting/film/video in the Last Year: Sometimes true     ?  Ran Out of Food in the Last Year: Sometimes true       Transportation Needs: No Transportation Needs        ?  Lack of Transportation (Medical): No     ?  Lack of Transportation (Non-Medical): No       Physical Activity: Unknown        ?  Days of Exercise per Week: Patient refused     ?  Minutes of Exercise per Session: Patient refused       Stress: Not on file     Social Connections: Not on file     Intimate Partner Violence: Not on file     Housing Stability: Unknown        ?  Unable to Pay for Housing in the Last Year: No     ?  Number of Places Lived in the Last Year: Not on file        ?  Unstable Housing in the Last Year: No            Patient is a 56 year old female who presents to the office today for ED follow-up.  Patient has been having symptoms of bilateral leg burning sensation for the past 2 months.  She was previously  tried with meloxicam but noted no improvement.  She also states she has been having intermittent dizziness for the past month.  Earlier this week about 5 days ago on 5/1 she had a severe headache as well as worsening of her dizziness.  Her blood pressure  was noted to be significantly elevated and she presented to the emergency room.  In the ED she had unremarkable lab work, as well as a CT head and CTA neck completed that were all unremarkable.  She was advised to follow-up with neurology outpatient.   She also had an EKG completed that showed no acute ischemic changes.  Patient states that since the ED visit her headache has improved significantly however she continues to have intermittent dizziness. She also continues to have burning lower leg pain.  She denies any vertigo type symptoms, chest pain or shortness of breath. She has chronic fatigue. She denies having passed out or lost consciousness.  In the ER she was discharged with gabapentin 100 mg 3 times daily as  well as Fioricet to use for headache.   She has not yet seen significant benefit.                 ROS:     Review of Systems    Constitutional:  Positive for malaise/fatigue. Negative for chills and fever.    HENT:  Negative for congestion.     Respiratory:  Negative for cough and shortness of breath.     Cardiovascular:  Negative for chest pain, palpitations and leg swelling.    Gastrointestinal:  Negative for abdominal pain, nausea and vomiting.    Neurological:  Positive for dizziness and tingling . Negative for weakness and headaches.         Objective:     Visit Vitals      BP  (!) 132/90 (BP 1 Location: Left upper arm, BP Patient Position: Standing, BP Cuff Size: Adult)     Pulse  71     Temp  97.2 F (36.2 C) (Temporal)     Resp  16     Ht  5\' 7"   (1.702 m)     Wt  171 lb 3.2 oz (77.7 kg)     SpO2  98%        BMI  26.81 kg/m              Vitals and Nurse Documentation reviewed.       Physical Exam   Constitutional :        General: She is not in acute distress.      Appearance: Normal appearance. She is not toxic-appearing.    HENT:       Head: Normocephalic and atraumatic.       Nose: Nose normal.       Mouth/Throat:       Mouth: Mucous membranes are moist.       Pharynx: Oropharynx is clear. No oropharyngeal exudate or posterior oropharyngeal erythema.    Eyes:       Extraocular Movements: Extraocular movements intact.       Conjunctiva/sclera: Conjunctivae normal.       Pupils: Pupils are equal, round, and reactive to light.     Cardiovascular:       Rate and Rhythm: Normal rate and regular rhythm.       Pulses: Normal pulses.       Heart sounds: Normal heart sounds. No murmur heard.     No gallop.    Pulmonary:       Effort: Pulmonary effort is normal. No respiratory distress.       Breath sounds: Normal breath sounds. No wheezing or rhonchi.     Abdominal:       General: Bowel sounds are normal. There is no distension.       Palpations: Abdomen is soft.       Tenderness: There is no abdominal tenderness. There is no guarding.     Musculoskeletal:       Cervical back: Neck supple.       Right lower leg: No edema.       Left lower leg: No edema.     Neurological:       General: No focal deficit present.       Mental Status: She is alert and oriented to person, place, and time.       Cranial Nerves: No cranial nerve deficit or facial asymmetry.       Sensory: Sensation is intact.  Motor: Motor function is intact.       Coordination: Coordination normal. Finger-Nose-Finger Test normal.       Gait: Gait is intact. Gait normal.

## 2021-05-14 NOTE — Progress Notes (Signed)
Progress  Notes by Levy Sjogren at 05/14/21 1000                Author: Levy Sjogren  Service: --  Author Type: Medical Assistant       Filed: 05/14/21 1356  Encounter Date: 05/14/2021  Status: Signed          Editor: Levy Sjogren (Medical Assistant)               Identified pt with two pt identifiers(name and DOB). Reviewed record in preparation for visit and have obtained necessary documentation.     Chief Complaint       Patient presents with        ?  Leg Pain             For x2 months; Bilateral leg pain         ?  High Blood Sugar             Pt went to [facility]  on 05/10/21         ?  Dizziness             For x1 month              Health Maintenance Due       Topic        ?  DTaP/Tdap/Td series (1 - Tdap)     ?  Shingles Vaccine (1 of 2)        ?  COVID-19 Vaccine (3 - Booster for [employer] series)             Coordination of Care Questionnaire:  :     1) Have you been to an emergency room, urgent care, or hospitalized since your last visit?  If yes, where when, and reason for visit? Yes, [facility] for elevated blood sugar         2. Have seen or consulted any other health care provider since your last visit?    If yes, where when, and reason for visit?  NO            Patient is accompanied by self I have received verbal consent from Jae Dire to discuss any/all medical information while they are present in the room.

## 2021-05-18 NOTE — Telephone Encounter (Signed)
Please call patient about recent lab results.  Please let her know that her TSH remains elevated therefore I would like to increase her levothyroxine dosage from 112 mcg daily to now 125 mcg daily.  Also her vitamin D level is low and I recommend she start vitamin D3 50,000 units once per week for 12 weeks.  If the patient is agreeable please let me know which pharmacy she would like this sent to.  Also advise her to schedule a follow-up with me in 6 weeks.

## 2021-05-19 ENCOUNTER — Encounter

## 2021-05-19 MED ORDER — LEVOTHYROXINE SODIUM 125 MCG PO TABS
125 MCG | ORAL_TABLET | Freq: Every day | ORAL | 1 refills | Status: DC
Start: 2021-05-19 — End: 2021-07-05

## 2021-05-19 MED ORDER — VITAMIN D (ERGOCALCIFEROL) 1.25 MG (50000 UT) PO CAPS
1.25 MG (50000 UT) | ORAL_CAPSULE | ORAL | 0 refills | Status: AC
Start: 2021-05-19 — End: 2021-09-03

## 2021-05-19 NOTE — Telephone Encounter (Signed)
Called patient she was busy asked me to call her back later

## 2021-07-05 ENCOUNTER — Encounter

## 2021-07-05 MED ORDER — LEVOTHYROXINE SODIUM 125 MCG PO TABS
125 MCG | ORAL_TABLET | ORAL | 0 refills | Status: DC
Start: 2021-07-05 — End: 2021-08-13

## 2021-08-06 ENCOUNTER — Ambulatory Visit
Admit: 2021-08-06 | Payer: BLUE CROSS/BLUE SHIELD | Attending: Student in an Organized Health Care Education/Training Program | Primary: Student in an Organized Health Care Education/Training Program

## 2021-08-06 DIAGNOSIS — Z0000 Encounter for general adult medical examination without abnormal findings: Secondary | ICD-10-CM

## 2021-08-06 MED ORDER — TETANUS-DIPHTH-ACELL PERTUSSIS 5-2.5-18.5 LF-MCG/0.5 IM SUSP
Freq: Once | INTRAMUSCULAR | 0 refills | Status: AC
Start: 2021-08-06 — End: 2021-08-06

## 2021-08-06 MED ORDER — ZOSTER VAC RECOMB ADJUVANTED 50 MCG/0.5ML IM SUSR
50 MCG/0.5ML | Freq: Once | INTRAMUSCULAR | 1 refills | Status: AC
Start: 2021-08-06 — End: 2021-08-06

## 2021-08-06 NOTE — Progress Notes (Signed)
Identified pt with two pt identifiers(name and DOB). Reviewed record in preparation for visit and have obtained necessary documentation.  Chief Complaint   Patient presents with    Annual Exam    Lower Back Pain     For x2 weeks        Health Maintenance Due   Topic    HIV screen     DTaP/Tdap/Td vaccine (1 - Tdap)    Shingles vaccine (1 of 2)    COVID-19 Vaccine (3 - Booster for [employer] series)       Coordination of Care Questionnaire:  :   1) Have you been to an emergency room, urgent care, or hospitalized since your last visit?  If yes, where when, and reason for visit? no      2. Have seen or consulted any other health care provider since your last visit?   If yes, where when, and reason for visit?  Yes, [facility]        Patient is accompanied by self I have received verbal consent from Jae Dire to discuss any/all medical information while they are present in the room.

## 2021-08-06 NOTE — Progress Notes (Signed)
Jacqueline Mckinney  56 y.o. female  1965/12/11  DGL:[id number]  [facility]  Progress Note     Assessment and Plan:    1. Annual physical exam  - CBC; Future  - Basic Metabolic Panel; Future  - Lipid Panel; Future  -Continue with routine eye and dental exams  -Order placed for routine mammogram  -Up-to-date on Pap smear and colonoscopy  -Prescription provided for immunizations  -Check routine lab work    2. Essential hypertension  - CBC; Future  - Basic Metabolic Panel; Future  -Chronic.  Previously well controlled although minimally elevated in office today  -Therefore advised patient to keep blood pressure log  -Follow-up in 1 month for reevaluation  -Continue hydrochlorothiazide daily    3. Acquired hypothyroidism  - TSH; Future  -History of hypothyroidism.  -Levothyroxine previously increased to 125 mcg daily  -Repeat TSH level    4. Acute left-sided low back pain without sciatica  - [facility] - Norlene Duel, MD, Orthopedic Surgery (back, neck, spine), [place]  -Acute.  No red flag signs or symptoms.  -Offered patient NSAID and/or muscle relaxer as well as possible physical therapy  -Patient declines treatment options at this time but would rather be evaluated by orthopedics  -Therefore Ortho referral placed    5. Vitamin D deficiency  - Vitamin D 25 Hydroxy; Future  -History of vitamin D deficiency.  Repeat vitamin D level today    6. Breast cancer screening by mammogram  - MAM DIGITAL SCREEN W OR WO CAD BILATERAL; Future    7. Immunization due  - Tetanus-Diphth-Acell Pertussis (BOOSTRIX) 5-2.5-18.5 LF-MCG/0.5 injection; Inject 0.5 mLs into the muscle once for 1 dose  Dispense: 1 each; Refill: 0  - zoster recombinant adjuvanted vaccine [facility]) 50 MCG/0.5ML SUSR injection; Inject 0.5 mLs into the muscle once for 1 dose Repeat in 6 months  Dispense: 1 each; Refill: 1       Diet, exercise and safety discussed with patient.  Diagnoses and plans were discussed with the patient.   After  visit summary given to the patient as well.    Return in about 4 weeks (around 09/03/2021), or if symptoms worsen or fail to improve.     Jacqueline Meigs, MD    Jacqueline Mckinney is a 56 y.o. female who had concerns including Annual Exam and Lower Back Pain (For x2 weeks).  Patient has a history of hypertension and does not routinely check her blood pressure at home. Previously she has had well-controlled blood pressure however minimally elevated in office today. She states she is compliant with her hydrochlorothiazide daily. She also has a history of hypothyroidism and recently had her levothyroxine dosage adjusted. She is due for repeat lab work today.  Finally of note patient does endorse a 2-week history of left-sided low back pain.  She denies any known injury.  She denies any lower extremity numbness, tingling or weakness.  She has been using ibuprofen and Tylenol without much benefit.  Patient declines any further medication management or physical therapy and desires a referral to orthopedic to have further evaluation by them.    Health maintenance:      Immunizations:    Influenza: n/a   Tetanus: not UTD- script provided   Shingles: due for Shingrix - script provided    COVID vaccine: up to date    Cancer screening status   Colonoscopy: guidelines reviewed: up to date   PAP: guidelines reviewed: up to date  Mammogram: guidelines reviewed: will order today    Bone density screening: will do at age 33    Smoking: Non smoker      ETOH: Non drinker    Drugs: Never user    Sexual history: low risk, declines STD screening today    Eye exam and Glaucoma Screening: up to date    Last dental exam: up to date    The following sections were reviewed & updated as appropriate: PMH, PSH, FH, and SH.    Patient Active Problem List   Diagnosis    Unsatisfactory vaginal cytology smear    Impaired glucose tolerance    Rash    Acquired hypothyroidism    Essential hypertension    Prediabetes    History of brain tumor     Vitamin D deficiency    Paresthesia of bilateral legs          Prior to Admission medications    Medication Sig Start Date End Date Taking? Authorizing Provider   magnesium (MAGNESIUM-OXIDE) 250 MG TABS tablet Take 1 tablet by mouth daily   Yes Historical Provider, MD   cetirizine (ZYRTEC) 10 MG tablet Take 1 tablet by mouth daily 04/21/21  Yes Historical Provider, MD   Tetanus-Diphth-Acell Pertussis (BOOSTRIX) 5-2.5-18.5 LF-MCG/0.5 injection Inject 0.5 mLs into the muscle once for 1 dose 08/06/21 08/06/21 Yes Jacqueline Meigs, MD   zoster recombinant adjuvanted vaccine [facility]) 50 MCG/0.5ML SUSR injection Inject 0.5 mLs into the muscle once for 1 dose Repeat in 6 months 08/06/21 08/06/21 Yes Jacqueline Meigs, MD   levothyroxine (SYNTHROID) 125 MCG tablet Take 1 tablet by mouth once daily 07/05/21  Yes Jacqueline Meigs, MD   vitamin D (ERGOCALCIFEROL) 1.25 MG (50000 UT) CAPS capsule Take 1 capsule by mouth once a week 05/19/21  Yes Jacqueline Meigs, MD   albuterol sulfate HFA (PROVENTIL;VENTOLIN;PROAIR) 108 (90 Base) MCG/ACT inhaler Inhale 1 puff into the lungs every 4 hours as needed 01/24/19  Yes Ar Automatic Reconciliation   albuterol (PROVENTIL) (2.5 MG/3ML) 0.083% nebulizer solution Inhale 1.5 mLs into the lungs every 4 hours as needed 01/24/19  Yes Ar Automatic Reconciliation   fluticasone (FLONASE) 50 MCG/ACT nasal spray Use 2 spray(s) in each nostril once daily 12/28/20  Yes Ar Automatic Reconciliation   hydroCHLOROthiazide (HYDRODIURIL) 25 MG tablet Take 1 tablet by mouth once daily 08/24/20  Yes Ar Automatic Reconciliation          No Known Allergies      Smoker:   Social History     Tobacco Use   Smoking Status Never   Smokeless Tobacco Never     ETOH:   Social History     Substance and Sexual Activity   Alcohol Use No       FH:    Family History   Problem Relation Age of Onset    Diabetes Mother        Review of Systems   Constitutional:  Negative for chills and fever.   HENT:  Negative for  rhinorrhea.    Respiratory:  Negative for cough and shortness of breath.    Cardiovascular:  Negative for chest pain and leg swelling.   Gastrointestinal:  Negative for abdominal pain, nausea and vomiting.   Musculoskeletal:  Positive for back pain.   Neurological:  Negative for dizziness, weakness, numbness and headaches.         Physical Exam:  Vitals:    08/06/21 0833   BP: (!) 145/93  Pulse: 69   Resp: 16   Temp: 97.3 F (36.3 C)   SpO2: 99%         Physical Examination:  Physical Exam  General appearance - alert, well appearing, and in no distress, oriented to person, place, and time and normal appearing weight  Mental status -  normal mood, behavior, speech, dress, motor activity, and thought processes  Ears - bilateral TM's and external ear canals normal  Nose - normal and patent, no erythema, or discharge  Mouth - mucous membranes moist, pharynx normal without lesions  Neck - supple, no significant adenopathy  Chest - normal chest excursion, normal inspiratory and expiratory function.  Clear to ausculation bilaterally.    Heart - normal rate, regular rhythm, normal S1, S2, no murmurs, rubs, clicks or gallops, no extremity edema  Abdomen - +BS, benign, no organomegaly, non-tender to palpation, no guarding   Skin-no apparent rashes  Neuro - normal speech, moves all extremities, normal gait  Musculoskeletal - grossly normal joint and motor function.    BACK EXAM:   Inspection: normal cervical and lumbar lordotic curve  Palpation: No tenderness to palpation of the lumbar spine; mild tenderness to palpation in left lumbar paraspinal muscles  Neurovascular: deep tendon reflexes: 2+ left patellar,  2+ right patellar, Symmetric Reflexes without clonus, normal sensation in b/l LE   Muscular Strength: 5/5 strength in bilateral lower extremities muscles   Range of Motion: Full ROM.    Maneuvers:   (-) bilateral straight leg raise bilaterally      Full Range of motion of both hips without pain or limitation

## 2021-08-07 LAB — BASIC METABOLIC PANEL
Anion Gap: 4 mmol/L — ABNORMAL LOW (ref 5–15)
BUN: 14 MG/DL (ref 6–20)
Bun/Cre Ratio: 23 — ABNORMAL HIGH (ref 12–20)
CO2: 30 mmol/L (ref 21–32)
Calcium: 9.9 MG/DL (ref 8.5–10.1)
Chloride: 104 mmol/L (ref 97–108)
Creatinine: 0.6 MG/DL (ref 0.55–1.02)
Est, Glom Filt Rate: >60 mL/min/{1.73_m2} (ref >60)
Glucose: 86 mg/dL (ref 65–100)
Potassium: 4.4 mmol/L (ref 3.5–5.1)
Sodium: 138 mmol/L (ref 136–145)

## 2021-08-07 LAB — CBC
Hematocrit: 42.7 % (ref 35.0–47.0)
Hemoglobin: 12.3 g/dL (ref 11.5–16.0)
MCH: 24.1 PG — ABNORMAL LOW (ref 26.0–34.0)
MCHC: 28.8 g/dL — ABNORMAL LOW (ref 30.0–36.5)
MCV: 83.7 FL (ref 80.0–99.0)
MPV: 11.6 FL (ref 8.9–12.9)
Nucleated RBCs: 0 PER 100 WBC
Platelets: 225 10*3/uL (ref 150–400)
RBC: 5.1 M/uL (ref 3.80–5.20)
RDW: 12.5 % (ref 11.5–14.5)
WBC: 4.5 10*3/uL (ref 3.6–11.0)
nRBC: 0 10*3/uL (ref 0.00–0.01)

## 2021-08-07 LAB — LIPID PANEL
Chol/HDL Ratio: 4 (ref 0.0–5.0)
Cholesterol, Total: 219 MG/DL — ABNORMAL HIGH (ref <200)
HDL: 55 MG/DL
LDL Calculated: 129.8 MG/DL — ABNORMAL HIGH (ref 0–100)
Triglycerides: 171 MG/DL — ABNORMAL HIGH (ref <150)
VLDL Cholesterol Calculated: 34.2 MG/DL

## 2021-08-07 LAB — VITAMIN D 25 HYDROXY: Vit D, 25-Hydroxy: 37.3 ng/mL (ref 30–100)

## 2021-08-07 LAB — TSH: TSH, 3RD GENERATION: 0.5 u[IU]/mL (ref 0.36–3.74)

## 2021-08-12 ENCOUNTER — Encounter

## 2021-08-13 MED ORDER — LEVOTHYROXINE SODIUM 125 MCG PO TABS
125 MCG | ORAL_TABLET | ORAL | 1 refills | Status: DC
Start: 2021-08-13 — End: 2022-02-24

## 2021-08-13 NOTE — Telephone Encounter (Signed)
Annie Sable, MD  Medication filled on 07/05/2021 by Hector Brunswick, MD  Last seen on 08/06/2021       Office Visit on 08/06/2021   Component Date Value Ref Range Status    Vit D, 25-Hydroxy 08/06/2021 37.3  30 - 100 ng/mL Final    Comment: (NOTE)  Deficiency               <20 ng/mL  Insufficiency          20-30 ng/mL  Sufficient            30-100 ng/mL  Possible toxicity       >100 ng/mL    The Method used is Siemens Advia Centaur currently standardized to a   Center of Disease Control and Prevention (CDC) certified reference   [place]. Samples containing fluorescein dye can produce falsely   elevated values when tested with the ADVIA Centaur Vitamin D Assay.   It is recommended that results in the toxic range, >100 ng/mL, be   retested 72 hours post fluorescein exposure.      TSH, 3RD GENERATION 08/06/2021 0.50  0.36 - 3.74 uIU/mL Final    Comment:   Due to TSH heterogeneity, both structurally and degree of glycosylation, monoclonal antibodies used in the TSH assay may not accurately quantitate TSH. Therefore, this result should be correlated with clinical findings as well as with other assessments of thyroid function, e.g., free T4, free T3.      Cholesterol, Total 08/06/2021 219 (H)  <200 MG/DL Final    Triglycerides 08/06/2021 171 (H)  <150 MG/DL Final    Based on NCEP-ATP III:  Triglycerides <150 mg/dL  is considered normal, 150-199 mg/dL  borderline high,  200-499 mg/dL high and  greater than or equal to 500 mg/dL very high.    HDL 08/06/2021 55  MG/DL Final    Based on NCEP ATP III, HDL Cholesterol <40 mg/dL is considered low and >60 mg/dL is elevated.    LDL Calculated 08/06/2021 129.8 (H)  0 - 100 MG/DL Final    Comment: Based on the NCEP-ATP: LDL-C concentrations are considered  optimal <100 mg/dL,  near optimal/above Normal 100-129 mg/dL  Borderline High: 130-159, High: 160-189 mg/dL  Very High: Greater than or equal to 190 mg/dL      VLDL Cholesterol Calculated 08/06/2021 34.2  MG/DL Final    Chol/HDL Ratio  08/06/2021 4.0  0.0 - 5.0   Final    Sodium 08/06/2021 138  136 - 145 mmol/L Final    Potassium 08/06/2021 4.4  3.5 - 5.1 mmol/L Final    Chloride 08/06/2021 104  97 - 108 mmol/L Final    CO2 08/06/2021 30  21 - 32 mmol/L Final    Anion Gap 08/06/2021 4 (L)  5 - 15 mmol/L Final    Glucose 08/06/2021 86  65 - 100 mg/dL Final    BUN 08/06/2021 14  6 - 20 MG/DL Final    Creatinine 08/06/2021 0.60  0.55 - 1.02 MG/DL Final    Bun/Cre Ratio 08/06/2021 23 (H)  12 - 20   Final    Est, Glom Filt Rate 08/06/2021 >60  >60 ml/min/1.86m Final    Comment:   Pediatric calculator link: https://www.kidney.org/professionals/kdoqi/gfr_calculatorped    These results are not intended for use in patients <158years of age.    eGFR results are calculated without a race factor using  the 2021 CKD-EPI equation. Careful clinical correlation is recommended, particularly when comparing to results calculated using previous  equations.  The CKD-EPI equation is less accurate in patients with extremes of muscle mass, extra-renal metabolism of creatinine, excessive creatine ingestion, or following therapy that affects renal tubular secretion.      Calcium 08/06/2021 9.9  8.5 - 10.1 MG/DL Final    WBC 08/06/2021 4.5  3.6 - 11.0 K/uL Final    RBC 08/06/2021 5.10  3.80 - 5.20 M/uL Final    Hemoglobin 08/06/2021 12.3  11.5 - 16.0 g/dL Final    Hematocrit 08/06/2021 42.7  35.0 - 47.0 % Final    MCV 08/06/2021 83.7  80.0 - 99.0 FL Final    MCH 08/06/2021 24.1 (L)  26.0 - 34.0 PG Final    MCHC 08/06/2021 28.8 (L)  30.0 - 36.5 g/dL Final    RDW 08/06/2021 12.5  11.5 - 14.5 % Final    Platelets 08/06/2021 225  150 - 400 K/uL Final    MPV 08/06/2021 11.6  8.9 - 12.9 FL Final    Nucleated RBCs 08/06/2021 0.0  0 PER 100 WBC Final    nRBC 08/06/2021 0.00  0.00 - 0.01 K/uL Final     Health Maintenance Due   Topic Date Due    HIV screen  Never done    DTaP/Tdap/Td vaccine (1 - Tdap) Never done    Shingles vaccine (1 of 2) Never done    COVID-19 Vaccine (3 -  Booster for Pfizer series) 03/30/2019    Flu vaccine (1) 08/10/2021

## 2021-08-21 ENCOUNTER — Encounter

## 2021-08-23 NOTE — Telephone Encounter (Signed)
PCP: [name], MD    Last appt: 08/06/21  Last labs:  08/06/21      Future Appointments   Date Time Provider Department Center   09/03/2021  9:20 AM [name], MD [facility]       Requested Prescriptions     Pending Prescriptions Disp Refills    vitamin D (ERGOCALCIFEROL) 1.25 MG (50000 UT) CAPS capsule [Pharmacy Med Name: Vitamin D (Ergocalciferol) 1.25 MG (50000 UT) Oral Capsule] 12 capsule 0     Sig: Take 1 capsule by mouth once a week       Prior labs and Blood pressures:  BP Readings from Last 3 Encounters:   08/06/21 (!) 145/93   05/14/21 (!) 132/90   04/21/21 116/79     Lab Results   Component Value Date/Time    NA 138 08/06/2021 09:45 AM    K 4.4 08/06/2021 09:45 AM    CL 104 08/06/2021 09:45 AM    CO2 30 08/06/2021 09:45 AM    BUN 14 08/06/2021 09:45 AM    GFRAA >60 04/17/2020 09:53 AM     No results found for: HBA1C, HBA1CPOC  Lab Results   Component Value Date/Time    CHOL 219 08/06/2021 09:45 AM    HDL 55 08/06/2021 09:45 AM    VLDL 42 07/29/2020 12:00 AM     No results found for: VITD3, VD3RIA    Lab Results   Component Value Date/Time    TSH 7.99 05/14/2021 12:05 PM

## 2021-09-03 ENCOUNTER — Ambulatory Visit
Admit: 2021-09-03 | Discharge: 2021-09-03 | Payer: BLUE CROSS/BLUE SHIELD | Attending: Student in an Organized Health Care Education/Training Program | Primary: Student in an Organized Health Care Education/Training Program

## 2021-09-03 DIAGNOSIS — I10 Essential (primary) hypertension: Secondary | ICD-10-CM

## 2021-09-03 MED ORDER — VITAMIN D3 25 MCG (1000 UT) PO TABS
251000 MCG (1000 UT) | ORAL_TABLET | Freq: Every day | ORAL | 1 refills | Status: DC
Start: 2021-09-03 — End: 2022-08-05

## 2021-09-03 NOTE — Progress Notes (Signed)
Assessment/Plan:     Diagnoses and all orders for this visit:    Essential hypertension  -Chronic.  Well-controlled and at goal.  -Continue current dosage of hydrochlorothiazide 25 mg daily    Vitamin D deficiency  -     vitamin D3 (CHOLECALCIFEROL) 25 MCG (1000 UT) TABS tablet; Take 1 tablet by mouth daily  -Chronic.  Improved.  -Can remain off the high-dose vitamin D but recommend taking vitamin D3 1000 units daily  -Continue with routine monitoring of vitamin D level    Nosebleed  -Acute.  Noted for approximately 1 week.  -Discussed supportive care measures including use of Vaseline as well as frequent use of saline nasal spray  -If symptoms are persistent and difficult to control consider ENT referral      Return in about 4 months (around 01/03/2022), or if symptoms worsen or fail to improve.     Discussed expected course/resolution/complications of diagnosis in detail with Jacqueline Mckinney.    Medication risks/benefits/costs/interactions/alternatives discussed with Jacqueline Mckinney.    Pt expressed understanding with the diagnosis and plan      Subjective:      Jacqueline Mckinney is a 56 y.o. female who presents for had concerns including 1 Month Follow-Up (Blood pressure check).     Current Outpatient Medications   Medication Sig Dispense Refill    vitamin D3 (CHOLECALCIFEROL) 25 MCG (1000 UT) TABS tablet Take 1 tablet by mouth daily 90 tablet 1    levothyroxine (SYNTHROID) 125 MCG tablet Take 1 tablet by mouth once daily 90 tablet 1    magnesium (MAGNESIUM-OXIDE) 250 MG TABS tablet Take 1 tablet by mouth daily      cetirizine (ZYRTEC) 10 MG tablet Take 1 tablet by mouth daily      albuterol sulfate HFA (PROVENTIL;VENTOLIN;PROAIR) 108 (90 Base) MCG/ACT inhaler Inhale 1 puff into the lungs every 4 hours as needed      albuterol (PROVENTIL) (2.5 MG/3ML) 0.083% nebulizer solution Inhale 1.5 mLs into the lungs every 4 hours as needed      fluticasone (FLONASE) 50 MCG/ACT nasal spray Use 2 spray(s) in each nostril once daily       hydroCHLOROthiazide (HYDRODIURIL) 25 MG tablet Take 1 tablet by mouth once daily       No current facility-administered medications for this visit.       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[facility]    TONSILLECTOMY AND ADENOIDECTOMY        Family History   Problem Relation Age of Onset    Diabetes Mother       Social History     Socioeconomic History    Marital status: Single     Spouse name: Not on file    Number of children: Not on file    Years of education: Not on file    Highest education level: Not on file   Occupational History    Not on file   Tobacco Use  Smoking status: Never    Smokeless tobacco: Never   Vaping Use    Vaping Use: Never used   Substance and Sexual Activity    Alcohol use: No    Drug use: No    Sexual activity: Not on file   Other Topics Concern    Not on file   Social History Narrative    Not on file     Social Determinants of Health     Financial Resource Strain: Medium Risk    Difficulty of Paying Living Expenses: Somewhat hard   Food Insecurity: Food Insecurity Present    Worried About Running Out of Food in the Last Year: Sometimes true    Ran Out of Food in the Last Year: Sometimes true   Transportation Needs: No Transportation Needs    Lack of Transportation (Medical): No    Lack of Transportation (Non-Medical): No   Physical Activity: Unknown    Days of Exercise per Week: Jacqueline Mckinney refused    Minutes of Exercise per Session: Jacqueline Mckinney refused   Stress: Not on file   Social Connections: Not on file   Intimate Partner Violence: Not on file   Housing Stability: Unknown    Unable to Pay for Housing in  the Last Year: No    Number of Places Lived in the Last Year: Not on file    Unstable Housing in the Last Year: No        Jacqueline Mckinney is a 56 year old female who presents to the office today for follow-up on hypertension.  Jacqueline Mckinney states she does not routinely check her blood pressure at home.  She has been compliant and consistent with her hydrochlorothiazide daily.  Blood pressure in office today is now within normal limits.  Jacqueline Mckinney also needs refill on vitamin D.  Her vitamin D level did normalize.  Also of note she states for the past week she has had some occasional nosebleeding.  Nothing significant however has noted this intermittently.  She denies any other acute concerns.        ROS:   Review of Systems   Constitutional:  Negative for chills and fever.   HENT:  Negative for rhinorrhea.    Respiratory:  Negative for cough and shortness of breath.    Cardiovascular:  Negative for chest pain and leg swelling.   Gastrointestinal:  Negative for abdominal pain, nausea and vomiting.   Neurological:  Negative for dizziness, weakness, numbness and headaches.       Objective:     Vitals:    09/03/21 0914   BP: 129/86   Pulse: 73   Resp: 16   Temp: 97.8 F (36.6 C)   SpO2: 98%          Vitals and Nurse Documentation reviewed.     Physical Exam  Constitutional:       General: She is not in acute distress.     Appearance: Normal appearance. She is not ill-appearing.   HENT:      Head: Normocephalic and atraumatic.      Nose: No congestion or rhinorrhea.      Comments: Minimal dried blood noted in bilateral nares  Eyes:      Conjunctiva/sclera: Conjunctivae normal.   Cardiovascular:      Rate and Rhythm: Normal rate and regular rhythm.      Heart sounds: Normal heart sounds. No murmur heard.    No friction rub. No gallop.   Pulmonary:      Effort: Pulmonary effort is  normal. No respiratory distress.      Breath sounds: Normal breath sounds. No wheezing, rhonchi or rales.   Abdominal:      General: Bowel sounds are normal.  There is no distension.      Palpations: Abdomen is soft.      Tenderness: There is no abdominal tenderness. There is no guarding or rebound.   Musculoskeletal:      Cervical back: Neck supple.      Right lower leg: No edema.      Left lower leg: No edema.   Neurological:      Mental Status: She is alert and oriented to person, place, and time.      Gait: Gait normal.       No results found for this visit on 09/03/21.

## 2021-09-03 NOTE — Progress Notes (Signed)
Formatting of this note is different from the original.  Subjective:     Chief Complaint: Pain of the Lower Back    HPI: Jacqueline Mckinney is a 56 y.o. female C/O Low back pain   Symptoms have been occurring for years  Her symptoms are exacerbated by activity     Bilateral leg pain.     Objective:     Pain rating = 9  out of 10   Body mass index is 26.63 kg/m.    Physical Exam  Constitutional:  Well developed, Well nourished. No acute distress  Psychiatric: Alert and cooperative. Pleasant on exam. Mood and affect are appropriate  Respiratory: No labored breathing    Low back exam:    lumbar spine tenderness  Grossly symmetric range of motion in bilateral lower extremities   Motor exam grossly non-focal  Sensory and reflex exams grossly non-focal  No cyanosis, clubbing or edema  Bilateral lower extremities are neurovascularly intact distally   antalgic gait    Radiographs:     Order: XR SPINE LUMBAR COMPLETE 4+ VW - Indication: Lumbar spine pain      X-ray lumbar spine complete 4+ views ([id number])    Result Date: 09/03/2021  AP, Lat, Flexion, Extension.     Ddd l3-s1     Assessment:     1. Lumbar spine pain    2. Lumbar radiculitis      Plan:     Orders Placed This Encounter    X-ray lumbar spine complete 4+ views ([id number])    Ambulatory referral to Physical Therapy    predniSONE (DELTASONE) 20 MG tablet    cyclobenzaprine (FLEXERIL) 10 MG tablet       Will do PT    Elba Barman, MD  Orthopaedic Spine Surgery  [facility]    Electronically signed by Norlene Duel, MD at 09/03/2021 12:38 PM EDT

## 2021-09-03 NOTE — Progress Notes (Signed)
Identified pt with two pt identifiers(name and DOB). Reviewed record in preparation for visit and have obtained necessary documentation.  Chief Complaint   Patient presents with    1 Month Follow-Up     Blood pressure check        Health Maintenance Due   Topic    HIV screen     DTaP/Tdap/Td vaccine (1 - Tdap)    Shingles vaccine (1 of 2)    COVID-19 Vaccine (3 - Booster for [employer] series)    Flu vaccine (1)       Coordination of Care Questionnaire:  :   1) Have you been to an emergency room, urgent care, or hospitalized since your last visit?  If yes, where when, and reason for visit? no      2. Have seen or consulted any other health care provider since your last visit?   If yes, where when, and reason for visit?  no        Patient is accompanied by self I have received verbal consent from Jae Dire to discuss any/all medical information while they are present in the room.

## 2021-10-15 ENCOUNTER — Ambulatory Visit
Admit: 2021-10-15 | Payer: BLUE CROSS/BLUE SHIELD | Attending: Student in an Organized Health Care Education/Training Program | Primary: Student in an Organized Health Care Education/Training Program

## 2021-10-15 DIAGNOSIS — I10 Essential (primary) hypertension: Secondary | ICD-10-CM

## 2021-10-15 MED ORDER — AMLODIPINE BESYLATE 5 MG PO TABS
5 MG | ORAL_TABLET | Freq: Every day | ORAL | 0 refills | Status: DC
Start: 2021-10-15 — End: 2022-01-21

## 2021-10-15 MED ORDER — HYDROCORTISONE (PERIANAL) 2.5 % EX CREA
2.5 % | CUTANEOUS | 0 refills | Status: AC
Start: 2021-10-15 — End: 2022-08-05

## 2021-10-15 NOTE — Progress Notes (Signed)
Identified pt with two pt identifiers(name and DOB). Reviewed record in preparation for visit and have obtained necessary documentation.  Chief Complaint   Patient presents with    Follow-Up from Hospital     [place]- High BP     Hemorrhoids     For x3 weeks; Experienced minor Bleeding, painful         Health Maintenance Due   Topic    Hepatitis B vaccine (1 of 3 - 3-dose series)    HIV screen     DTaP/Tdap/Td vaccine (1 - Tdap)    Shingles vaccine (1 of 2)    COVID-19 Vaccine (3 - Pfizer series)    Flu vaccine (1)       Coordination of Care Questionnaire:  :   1) Have you been to an emergency room, urgent care, or hospitalized since your last visit?  If yes, where when, and reason for visit? Yes, [facility] for high Blood pressure       2. Have seen or consulted any other health care provider since your last visit?   If yes, where when, and reason for visit?  no        Patient is accompanied by self, daughter I have received verbal consent from HOLLYANN PABLO to discuss any/all medical information while they are present in the room.

## 2021-10-15 NOTE — Progress Notes (Signed)
Assessment/Plan:     Diagnoses and all orders for this visit:    Essential hypertension  -     amLODIPine (NORVASC) 5 MG tablet; Take 1 tablet by mouth daily  -Chronic.  Uncontrolled.  BP in office today elevated.  -Continue hydrochlorothiazide 25 mg daily  -Recommend adding amlodipine 5 mg daily  -Monitor home BP  -Follow-up in 4 weeks for reevaluation or sooner if needed    Chest pain, unspecified type  -     External Referral To Cardiology  -Patient evaluated in the emergency room on 9/4 with unremarkable work-up   -Patient currently asymptomatic  -Has been referred to cardiology previously but needs new referral.  Referral placed today    Hemorrhoids, unspecified hemorrhoid type  -     hydrocortisone (PROCTOSOL HC) 2.5 % CREA rectal cream; Apply BID as as needed externally for up to 1 week  -Previous history of hemorrhoids.  External hemorrhoid noted on exam  -Patient also endorsing constipation.  -Discussed supportive care measures.  -Also recommend increasing fiber intake as well as using MiraLAX daily due to constipation  -Also will send in Proctosol cream to use as needed.  Discussed medication usage and risks.       Return in about 4 weeks (around 11/12/2021).     Discussed expected course/resolution/complications of diagnosis in detail with patient.    Medication risks/benefits/costs/interactions/alternatives discussed with patient.    Pt expressed understanding with the diagnosis and plan.       Subjective:      Jacqueline Mckinney is a 56 y.o. female who presents for had concerns including Follow-Up from Hospital ([place]- High BP ) and Hemorrhoids (For x3 weeks; Experienced minor Bleeding, painful ).     Current Outpatient Medications   Medication Sig Dispense Refill    cyclobenzaprine (FLEXERIL) 10 MG tablet Take 1 tablet by mouth 3 times daily as needed      amLODIPine (NORVASC) 5 MG tablet Take 1 tablet by mouth daily 90 tablet 0    hydrocortisone (PROCTOSOL HC) 2.5 % CREA rectal cream Apply BID as  as needed externally for up to 1 week 28 g 0    polyethylene glycol (GLYCOLAX) 17 g packet Take 1 packet by mouth daily as needed for Constipation      vitamin D3 (CHOLECALCIFEROL) 25 MCG (1000 UT) TABS tablet Take 1 tablet by mouth daily 90 tablet 1    levothyroxine (SYNTHROID) 125 MCG tablet Take 1 tablet by mouth once daily 90 tablet 1    magnesium (MAGNESIUM-OXIDE) 250 MG TABS tablet Take 1 tablet by mouth daily      cetirizine (ZYRTEC) 10 MG tablet Take 1 tablet by mouth daily      albuterol sulfate HFA (PROVENTIL;VENTOLIN;PROAIR) 108 (90 Base) MCG/ACT inhaler Inhale 1 puff into the lungs every 4 hours as needed      albuterol (PROVENTIL) (2.5 MG/3ML) 0.083% nebulizer solution Inhale 1.5 mLs into the lungs every 4 hours as needed      fluticasone (FLONASE) 50 MCG/ACT nasal spray Use 2 spray(s) in each nostril once daily      hydroCHLOROthiazide (HYDRODIURIL) 25 MG tablet Take 1 tablet by mouth once daily       No current facility-administered medications for this visit.       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[facility]    TONSILLECTOMY AND ADENOIDECTOMY        Family History   Problem Relation Age of Onset    Diabetes Mother       Social History     Socioeconomic History    Marital status: Single     Spouse name: Not on file    Number of children: Not on file    Years of education: Not on file    Highest education level: Not on file   Occupational History    Not on file   Tobacco Use    Smoking status: Never    Smokeless tobacco: Never   Vaping Use    Vaping Use: Never used    Substance and Sexual Activity    Alcohol use: No    Drug use: No    Sexual activity: Not on file   Other Topics Concern    Not on file   Social History Narrative    Not on file     Social Determinants of Health     Financial Resource Strain: Medium Risk (04/21/2021)    Overall Financial Resource Strain (CARDIA)     Difficulty of Paying Living Expenses: Somewhat hard   Food Insecurity: Food Insecurity Present (04/21/2021)    Hunger Vital Sign     Worried About Running Out of Food in the Last Year: Sometimes true     Ran Out of Food in the Last Year: Sometimes true   Transportation Needs: No Transportation Needs (04/21/2021)    PRAPARE - Therapist, art (Medical): No     Lack of Transportation (Non-Medical): No   Physical Activity: Unknown (04/21/2021)    Exercise Vital Sign     Days of Exercise per Week: Patient refused     Minutes of Exercise per Session: Patient refused   Stress: Not on file   Social Connections: Not on file   Intimate Partner Violence: Not on file   Housing Stability: Unknown (04/21/2021)    Housing Stability Vital Sign     Unable to Pay for Housing in the Last Year: No     Number of Places Lived in the Last Year: Not on file     Unstable Housing in the Last Year: No        Patient is a 56 year old female who presents to the office today for follow-up from the emergency room as well as concerns for a hemorrhoid.  Patient was evaluated in the emergency room last month due to chest pain and sweating.  Patient had evaluation and complete work-up that was unremarkable. Believed to be heat exposure and was given IV fluids. She was referred to cardiology but has not been able to schedule appointment yet.  She denies any chest pain today, fever, chills, shortness of breath, nausea, or vomiting.  Patient also has a history of hypertension but does not routinely check her blood pressure.  She states when her BP is checked at work it is usually within normal limits.  BP in office  today is elevated.  She also does complain of hemorrhoids.  She recently had a colonoscopy this year that was unremarkable.  She does note some bright red blood on the tissue paper and pain at the site of hemorrhoid.  She has had this in the past.  She does have to strain to have a bowel movement and has been experiencing constipation.  She denies any other acute concerns.    Hemorrhoids  Pertinent negatives include no abdominal pain, chest pain, chills, coughing, fever, headaches, nausea, numbness, vomiting or weakness.         ROS:   Review of Systems   Constitutional:  Negative for chills and fever.   HENT:  Negative for rhinorrhea.    Respiratory:  Negative for cough and shortness of breath.    Cardiovascular:  Negative for chest pain and leg swelling.   Gastrointestinal:  Positive for constipation and hemorrhoids. Negative for abdominal pain, nausea and vomiting.   Neurological:  Negative for dizziness, weakness, numbness and headaches.     Objective:     Vitals:    10/15/21 1612   BP: (!) 146/100   Pulse:    Resp:    Temp:    SpO2:       BP (!) 146/100   Pulse 81   Temp 97.8 F (36.6 C) (Temporal)   Resp 16   Ht 5\' 7"  (1.702 m)   Wt 166 lb (75.3 kg)   SpO2 98%   BMI 26.00 kg/m       Vitals and Nurse Documentation reviewed.     Physical Exam  Exam conducted with a chaperone present.   Constitutional:       General: She is not in acute distress.     Appearance: Normal appearance. She is not ill-appearing.   HENT:      Head: Normocephalic and atraumatic.   Eyes:      Conjunctiva/sclera: Conjunctivae normal.   Cardiovascular:      Rate and Rhythm: Normal rate and regular rhythm.      Heart sounds: Normal heart sounds. No murmur heard.     No friction rub. No gallop.   Pulmonary:      Effort: Pulmonary effort is normal. No respiratory distress.      Breath sounds: Normal breath sounds. No wheezing, rhonchi or rales.   Abdominal:      General: Bowel sounds are normal. There is no distension.       Palpations: Abdomen is soft.      Tenderness: There is no abdominal tenderness. There is no guarding or rebound.   Genitourinary:     Rectum: External hemorrhoid present.   Musculoskeletal:      Cervical back: Neck supple.      Right lower leg: No edema.      Left lower leg: No edema.   Neurological:      Mental Status: She is alert and oriented to person, place, and time.      Gait: Gait normal.         No results found for this visit on 10/15/21.

## 2021-10-19 NOTE — Telephone Encounter (Signed)
LVM to call so we can reschedule appt of 11/17/21. PCP not available

## 2021-10-22 ENCOUNTER — Encounter

## 2021-11-01 ENCOUNTER — Other Ambulatory Visit (HOSPITAL_COMMUNITY): Payer: Self-pay | Admitting: Physician Assistant

## 2021-11-01 DIAGNOSIS — Z1231 Encounter for screening mammogram for malignant neoplasm of breast: Secondary | ICD-10-CM

## 2021-11-08 MED ORDER — HYDROCHLOROTHIAZIDE 25 MG PO TABS
25 MG | ORAL_TABLET | ORAL | 0 refills | Status: DC
Start: 2021-11-08 — End: 2022-02-24

## 2021-11-08 NOTE — Telephone Encounter (Signed)
PCP: Annie Sable, MD    Last appt: @[email or web address] @  Future Appointments   Date Time Provider [place]   12/24/2021 11:00 AM Hearst, Megan Salon, MD [facility] AMB       Requested Prescriptions     Pending Prescriptions Disp Refills    hydroCHLOROthiazide (HYDRODIURIL) 25 MG tablet [Pharmacy Med Name: hydroCHLOROthiazide 25 MG Oral Tablet] 90 tablet 0     Sig: Take 1 tablet by mouth once daily for 90 days       Prior labs and Blood pressures:  BP Readings from Last 3 Encounters:   10/15/21 (!) 146/100   09/03/21 129/86   08/06/21 (!) 145/93     Lab Results   Component Value Date/Time    NA 138 08/06/2021 09:45 AM    K 4.4 08/06/2021 09:45 AM    CL 104 08/06/2021 09:45 AM    CO2 30 08/06/2021 09:45 AM    BUN 14 08/06/2021 09:45 AM    GFRAA >60 04/17/2020 09:53 AM     No results found for: "HBA1C", "HBA1CPOC"  Lab Results   Component Value Date/Time    CHOL 219 08/06/2021 09:45 AM    HDL 55 08/06/2021 09:45 AM    VLDL 42 07/29/2020 12:00 AM     No results found for: "VITD3", "VD3RIA"    Lab Results   Component Value Date/Time    TSH 7.99 05/14/2021 12:05 PM

## 2021-11-17 ENCOUNTER — Ambulatory Visit
Payer: BLUE CROSS/BLUE SHIELD | Attending: Student in an Organized Health Care Education/Training Program | Primary: Student in an Organized Health Care Education/Training Program

## 2021-11-24 ENCOUNTER — Ambulatory Visit (HOSPITAL_COMMUNITY)
Admission: RE | Admit: 2021-11-24 | Discharge: 2021-11-24 | Disposition: A | Discharge status: Home / Self Care | Payer: BC Managed Care – PPO | Source: Ambulatory Visit | Attending: Physician Assistant | Admitting: Physician Assistant

## 2021-11-24 DIAGNOSIS — Z1231 Encounter for screening mammogram for malignant neoplasm of breast: Secondary | ICD-10-CM | POA: Diagnosis not present

## 2021-12-10 NOTE — Progress Notes (Signed)
Formatting of this note is different from the original.  Visit Number: 6    Subjective     Medical Diagnosis: Low back pain  Treatment Diagnosis: Low back pain  Referring Physician: Dr. Selena Batten  Treating Therapist: Shara Blazing  Start date: 10/15/21  Outcome measure: ODI: 38%    Chief Complaint: Low back pain    Pt reports that she has had reduced low back pain lately but that she has been experiencing cramping in her legs on both sides at night more often.        ?    Objective     -Observation: slight anterior pelvic tilt and increased thoracic kyphosis    -Palpation: TTP of L lumbar paraspinals, and hypomobility and pain with CPA from L2-L4.    - Gait analysis: L lateral lean, decreased pelvic rotation, and slowed cadence.    - Special Tests:     - Trunk flexion endurance: unable to lift shoulder blades off table.      -Lumbar AROM       - Flexion: 100%       - Extension: 75%       - L SB: mid patella       - R SB: mid patella       - L rot: 75%       - R rot: 75%    Hip Left    Right       AROM PROM MMT Pain MMT AROM PROM MMT Pain MMT     Flexion   4    4      Abduction   4-    4-      External Rotation (90)   4+    4+      Internal Rotation (90)   4+    4+      Treatments    Therapeutic Exercises (97110) to develop strength, endurance, range of motion, flexibility  -Direct 1:1 25 min     -**SL hip abd 2x10     -**SLR 2x10     -SWB DKTC 5"x20     -stepper 5 min,    -open book 10x 5"     -low back relaxation stretch on SWB 3 min     -SWB LTR 2'       Manual Therapy (76226) for joint mobilization/manipulation, soft tissue mobilization  -Direct 1:1 20 min     -STM to L lumbar paraspinals, QL       Modalities  Skin Check Complete    Hot/Cold Therapy (97010) Hot/cold pack applied to address pain, muscular spasm, and/or edema  Moist Heat: in supine position applied to lumbar spine for 10 mins    Comments:  Access Code: [email or web address]  URL: [email or web address]  Date: 11/26/2021  Prepared by: Darlyne Russian    Exercises  - Supine Posterior Pelvic Tilt  - 2 x daily - 7 x weekly - 2 sets - 10 reps  - Supine Bridge  - 1-2 x daily - 7 x weekly - 3 sets - 10 reps  - Hooklying Clamshell with Resistance  - 1-2 x daily - 7 x weekly - 3 sets - 10 reps  - Supine Piriformis Stretch with Foot on Ground  - 1 x daily - 7 x weekly - 3-5 reps - 10-20 sec hold  - Seated March  - 1 x daily - 7 x weekly - 1-2 sets - 10 reps  - Side Stepping with Counter Support  -  1 x daily - 7 x weekly - 3-5 reps    12/10/21 Time out 9:09 + 10 min heat    The therapy interventions in this note were performed separately and at distinct intervals of time.  Goals    1.) Due Date: 11/12/2021  Met   Patient will be independent with HEP to maintain gains from PT.    2.) Due Date: 11/12/2021  Met   Patient will reduce ODI score from 38 to 28% to demonstrate improved low back function.    3.) Due Date: 12/10/2021  Not met   Patient will reduce ODI score from 38 to 10% to demonstrate improved low back function.    4.) Due Date: 12/10/2021 Patient will improve hip strength to 4+/5 for all motions.    5.) Due Date: 11/12/2021  Not met   Patient will reduce worst pain from 9 to 4/10.      Assessment   Patient has made good progress with reduced low back pain and improved hip strength compared to I.E. She does continue to have low back pain and core weakness with increased difficulty with core strengthening exercises secondary to shortness of breath with TVA contraction. Patient will continue to benefit from skilled PT to further progress core and hip strength.    Plan     Check in with low back symptoms. Progress lumbar mobility and glute/core strength per pt's tolerance.       Electronically signed by Maximino Sarin, PT at 12/10/2021 12:37 PM EST

## 2021-12-24 ENCOUNTER — Encounter
Payer: BLUE CROSS/BLUE SHIELD | Attending: Student in an Organized Health Care Education/Training Program | Primary: Student in an Organized Health Care Education/Training Program

## 2022-01-21 ENCOUNTER — Ambulatory Visit
Admit: 2022-01-21 | Discharge: 2022-01-21 | Payer: BLUE CROSS/BLUE SHIELD | Attending: Student in an Organized Health Care Education/Training Program | Primary: Student in an Organized Health Care Education/Training Program

## 2022-01-21 DIAGNOSIS — I10 Essential (primary) hypertension: Secondary | ICD-10-CM

## 2022-01-21 MED ORDER — PANTOPRAZOLE SODIUM 40 MG PO TBEC
40 MG | ORAL_TABLET | Freq: Every day | ORAL | 0 refills | Status: AC
Start: 2022-01-21 — End: 2022-08-05

## 2022-01-21 NOTE — Progress Notes (Signed)
Assessment/Plan:       Diagnoses and all orders for this visit:    Essential hypertension  -     CBC with Auto Differential; Future  -     Comprehensive Metabolic Panel; Future  -     Urinalysis with Reflex to Culture; Future  -     TSH; Future  -Chronic.  Well-controlled.  -Patient states Jacqueline Mckinney never picked up the amlodipine 5 mg daily  -Given BP currently shows good control can continue on hydrochlorothiazide daily only    Mixed hyperlipidemia  -     Lipid Panel; Future  -Chronic.  Presumed stable.  -Repeat lipid panel    Epigastric pain  -     CT ABDOMEN PELVIS W WO CONTRAST Additional Contrast? Oral; Future  -     CBC with Auto Differential; Future  -     Comprehensive Metabolic Panel; Future  -     Urinalysis with Reflex to Culture; Future  -     pantoprazole (PROTONIX) 40 MG tablet; Take 1 tablet by mouth every morning (before breakfast)  -     AFL - Deno Lunger, MD, Gastroenterology, [place]  -Chronic.  Per patient has been noted for approximately 6 months  -Noted primarily within the epigastric region.  Consider gastritis  -Patient did recently have a right upper quadrant abdominal ultrasound that noted a liver lesion but was otherwise unremarkable  -Will start patient on pantoprazole daily  -Also recommend evaluation by gastroenterology for possible EGD and colonoscopy   -Check lab work today  -Also given persistent symptoms along with incidental liver lesion finding on ultrasound will obtain a CT abdomen with and without contrast    Liver lesion  -     CT ABDOMEN PELVIS W WO CONTRAST Additional Contrast? Oral; Future  -     Comprehensive Metabolic Panel; Future  -     AFL - Deno Lunger, MD, Gastroenterology, [place]  -Incidental large rounded hypoechoic lesion in the liver that was larger than previously noted  -Stated neoplasm is within the differential on ultrasound  -Requesting records on who and why imaging was previously ordered given patient seems to be a poor historian  -Also will obtain a CT  abdomen and pelvis with and without contrast for further evaluation     Return in about 4 weeks (around 02/18/2022), or if symptoms worsen or fail to improve.     Discussed expected course/resolution/complications of diagnosis in detail with patient.    Medication risks/benefits/costs/interactions/alternatives discussed with patient.    Pt expressed understanding with the diagnosis and plan      Subjective:      Jacqueline Mckinney is a 57 y.o. female who presents for had concerns including Follow-up (Personal discussion ) and GI Problem (Gas).     Current Outpatient Medications   Medication Sig Dispense Refill    pantoprazole (PROTONIX) 40 MG tablet Take 1 tablet by mouth every morning (before breakfast) 90 tablet 0    hydroCHLOROthiazide (HYDRODIURIL) 25 MG tablet Take 1 tablet by mouth once daily for 90 days 90 tablet 0    hydrocortisone (PROCTOSOL HC) 2.5 % CREA rectal cream Apply BID as as needed externally for up to 1 week 28 g 0    vitamin D3 (CHOLECALCIFEROL) 25 MCG (1000 UT) TABS tablet Take 1 tablet by mouth daily 90 tablet 1    levothyroxine (SYNTHROID) 125 MCG tablet Take 1 tablet by mouth once daily 90 tablet 1    magnesium (MAGNESIUM-OXIDE) 250 MG  TABS tablet Take 1 tablet by mouth daily      cetirizine (ZYRTEC) 10 MG tablet Take 1 tablet by mouth daily      fluticasone (FLONASE) 50 MCG/ACT nasal spray Use 2 spray(s) in each nostril once daily       No current facility-administered medications for this visit.       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[facility]     TONSILLECTOMY AND ADENOIDECTOMY        Family History   Problem Relation Age of Onset    Diabetes Mother       Social History     Socioeconomic History    Marital status: Single     Spouse name: Not on file    Number of children: Not on file    Years of education: Not on file    Highest education level: Not on file   Occupational History    Not on file   Tobacco Use    Smoking status: Never    Smokeless tobacco: Never   Vaping Use    Vaping Use: Never used   Substance and Sexual Activity    Alcohol use: No    Drug use: No    Sexual activity: Not on file   Other Topics Concern    Not on file   Social History Narrative    Not on file     Social Determinants of Health     Financial Resource Strain: Low Risk  (01/21/2022)    Overall Financial Resource Strain (CARDIA)     Difficulty of Paying Living Expenses: Not hard at all   Food Insecurity: No Food Insecurity (01/21/2022)    Hunger Vital Sign     Worried About Running Out of Food in the Last Year: Never true     Ran Out of Food in the Last Year: Never true   Transportation Needs: No Transportation Needs (01/21/2022)    PRAPARE - Therapist, art (Medical): No     Lack of Transportation (Non-Medical): No   Physical Activity: Patient Declined (04/21/2021)    Exercise Vital Sign     Days of Exercise per Week: Patient declined     Minutes of Exercise per Session: Patient declined   Stress: Not on file   Social Connections: Not on file   Intimate Partner Violence: Not on file   Housing Stability: Unknown (01/21/2022)    Housing Stability Vital Sign     Unable to Pay for Housing in the Last Year: No     Number of Places Lived in the Last Year: Not on file     Unstable Housing in the Last Year: No        Patient is a 57 year old female who presents to the office today for follow-up on hypertension as well as concerns for abdominal pain.  Patient  has a history of hypertension and states that Jacqueline Mckinney never did start the amlodipine daily.  Jacqueline Mckinney has only remained  on the hydrochlorothiazide.  Jacqueline Mckinney states Jacqueline Mckinney does not routinely check her home blood pressure readings however BP today is within normal limits.  Also of note Jacqueline Mckinney does follow with cardiology and was last seen in October 2023.  Jacqueline Mckinney states Jacqueline Mckinney has upcoming stress test scheduled.  Patient also states Jacqueline Mckinney has been having intermittent epigastric abdominal pain for approximately 6 months.  Jacqueline Mckinney also endorses burping and acid reflux type symptoms.  Jacqueline Mckinney denies any nausea, vomiting or blood in the stool.  Jacqueline Mckinney does have a history of hemorrhoids and does occasionally have to strain to have a bowel movement.  Jacqueline Mckinney has not been using the MiraLAX daily as recommended.  Jacqueline Mckinney denies any other changes in her bowel movement.  Jacqueline Mckinney does state Jacqueline Mckinney had an ultrasound of her abdomen completed in December.  It appears it was ordered by Dr. Rebeca Allegra however patient does not recall who ordered this ultrasound or why.  There was an incidental finding of a rounded hypoechoic lesion within the liver that was larger than previous study.  Also stated neoplasm is in the differential.  Patient states Jacqueline Mckinney was unaware of the result.  Requesting records for review.           ROS:   Review of Systems   Constitutional:  Negative for chills and fever.   HENT:  Negative for rhinorrhea.    Respiratory:  Negative for cough and shortness of breath.    Cardiovascular:  Negative for chest pain and leg swelling.   Gastrointestinal:  Positive for abdominal pain and constipation. Negative for blood in stool, diarrhea, nausea and vomiting.   Genitourinary:  Negative for dysuria, frequency and urgency.   Neurological:  Negative for dizziness, weakness, numbness and headaches.         Objective:     Vitals:    01/21/22 0959   BP: 124/81   Pulse: 73   Resp: 16   Temp: 97.7 F (36.5 C)   SpO2: 97%          Vitals and Nurse Documentation reviewed.     Physical Exam  Constitutional:       General: Jacqueline Mckinney is not in acute distress.     Appearance: Normal appearance. Jacqueline Mckinney is not  ill-appearing.   HENT:      Head: Normocephalic and atraumatic.   Eyes:      Conjunctiva/sclera: Conjunctivae normal.   Cardiovascular:      Rate and Rhythm: Normal rate and regular rhythm.      Heart sounds: Normal heart sounds. No murmur heard.     No friction rub. No gallop.   Pulmonary:      Effort: Pulmonary effort is normal. No respiratory distress.      Breath sounds: Normal breath sounds. No wheezing, rhonchi or rales.   Abdominal:      General: Bowel sounds are normal. There is no distension.      Palpations: Abdomen is soft.      Tenderness: There is abdominal tenderness (Minimal tenderness to palpation in the epigastric region only, remainder of abdomen nontender to palpation). There is no guarding or rebound.   Musculoskeletal:      Cervical back: Neck supple.      Right lower leg: No edema.      Left lower leg: No edema.   Neurological:      Mental Status: Jacqueline Mckinney is  alert and oriented to person, place, and time.      Gait: Gait normal.         No results found for this visit on 01/21/22.

## 2022-01-21 NOTE — Progress Notes (Signed)
1. Have you been to the ER, urgent care clinic since your last visit?  Hospitalized since your last visit?Yes When: 12/08/21 Where: [facility] Reason for visit: GI pain    2. Have you seen or consulted any other health care providers outside of the [place] since your last visit?  Include any pap smears or colon screening. No      Chief Complaint   Patient presents with    Follow-up     Personal discussion     GI Problem     Gas    Leg Pain     B/l leg pain     Health Maintenance Due   Topic Date Due    Hepatitis B vaccine (1 of 3 - 3-dose series) Never done    HIV screen  Never done    DTaP/Tdap/Td vaccine (1 - Tdap) Never done    Flu vaccine (1) 08/10/2021    COVID-19 Vaccine (3 - 2023-24 season) 09/10/2021    Shingles vaccine (2 of 3) 01/02/2022     PHQ-9 Total Score: 0 (01/21/2022  9:59 AM)        01/21/2022    10:00 AM   AMB Abuse Screening   Do you ever feel afraid of your partner? N   Are you in a relationship with someone who physically or mentally threatens you? N   Is it safe for you to go home? Y     Vitals:    01/21/22 0959   BP: 124/81   Pulse: 73   Resp: 16   Temp: 97.7 F (36.5 C)   SpO2: 97%

## 2022-01-22 LAB — URINALYSIS WITH REFLEX TO CULTURE
BACTERIA, URINE: NEGATIVE /hpf
Bilirubin Urine: NEGATIVE
Blood, Urine: NEGATIVE
Glucose, UA: NEGATIVE mg/dL
Ketones, Urine: NEGATIVE mg/dL
Leukocyte Esterase, Urine: NEGATIVE
Nitrite, Urine: NEGATIVE
Protein, UA: NEGATIVE mg/dL
Specific Gravity, UA: 1.017 (ref 1.003–1.030)
Urobilinogen, Urine: 0.2 EU/dL (ref 0.2–1.0)
pH, Urine: 6 (ref 5.0–8.0)

## 2022-01-22 LAB — CBC WITH AUTO DIFFERENTIAL
Absolute Immature Granulocyte: 0 10*3/uL (ref 0.00–0.04)
Basophils %: 1 % (ref 0–1)
Basophils Absolute: 0.1 10*3/uL (ref 0.0–0.1)
Eosinophils %: 2 % (ref 0–7)
Eosinophils Absolute: 0.1 10*3/uL (ref 0.0–0.4)
Hematocrit: 39.6 % (ref 35.0–47.0)
Hemoglobin: 12.1 g/dL (ref 11.5–16.0)
Immature Granulocytes: 0 % (ref 0.0–0.5)
Lymphocytes %: 31 % (ref 12–49)
Lymphocytes Absolute: 1.5 10*3/uL (ref 0.8–3.5)
MCH: 24.2 PG — ABNORMAL LOW (ref 26.0–34.0)
MCHC: 30.6 g/dL (ref 30.0–36.5)
MCV: 79.4 FL — ABNORMAL LOW (ref 80.0–99.0)
MPV: 13 FL — ABNORMAL HIGH (ref 8.9–12.9)
Monocytes %: 8 % (ref 5–13)
Monocytes Absolute: 0.4 10*3/uL (ref 0.0–1.0)
Neutrophils %: 58 % (ref 32–75)
Neutrophils Absolute: 2.8 10*3/uL (ref 1.8–8.0)
Nucleated RBCs: 0 PER 100 WBC
Platelets: 220 10*3/uL (ref 150–400)
RBC: 4.99 M/uL (ref 3.80–5.20)
RDW: 13 % (ref 11.5–14.5)
WBC: 4.9 10*3/uL (ref 3.6–11.0)
nRBC: 0 10*3/uL (ref 0.00–0.01)

## 2022-01-22 LAB — COMPREHENSIVE METABOLIC PANEL
ALT: 38 U/L (ref 12–78)
AST: 28 U/L (ref 15–37)
Albumin/Globulin Ratio: 1.1 (ref 1.1–2.2)
Albumin: 4.1 g/dL (ref 3.5–5.0)
Alk Phosphatase: 95 U/L (ref 45–117)
Anion Gap: 2 mmol/L — ABNORMAL LOW (ref 5–15)
BUN: 20 MG/DL (ref 6–20)
Bun/Cre Ratio: 31 — ABNORMAL HIGH (ref 12–20)
CO2: 32 mmol/L (ref 21–32)
Calcium: 9.6 MG/DL (ref 8.5–10.1)
Chloride: 103 mmol/L (ref 97–108)
Creatinine: 0.65 MG/DL (ref 0.55–1.02)
Est, Glom Filt Rate: >60 mL/min/{1.73_m2} (ref >60)
Globulin: 3.7 g/dL (ref 2.0–4.0)
Glucose: 87 mg/dL (ref 65–100)
Potassium: 3.9 mmol/L (ref 3.5–5.1)
Sodium: 137 mmol/L (ref 136–145)
Total Bilirubin: 1.6 MG/DL — ABNORMAL HIGH (ref 0.2–1.0)
Total Protein: 7.8 g/dL (ref 6.4–8.2)

## 2022-01-22 LAB — LIPID PANEL
Chol/HDL Ratio: 3.9 (ref 0.0–5.0)
Cholesterol, Total: 213 MG/DL — ABNORMAL HIGH (ref <200)
HDL: 55 MG/DL
LDL Calculated: 127.8 MG/DL — ABNORMAL HIGH (ref 0–100)
Triglycerides: 151 MG/DL — ABNORMAL HIGH (ref <150)
VLDL Cholesterol Calculated: 30.2 MG/DL

## 2022-01-22 LAB — TSH: TSH, 3RD GENERATION: 2.34 u[IU]/mL (ref 0.36–3.74)

## 2022-02-12 ENCOUNTER — Encounter

## 2022-02-12 ENCOUNTER — Inpatient Hospital Stay
Admit: 2022-02-12 | Payer: BLUE CROSS/BLUE SHIELD | Attending: Student in an Organized Health Care Education/Training Program | Primary: Student in an Organized Health Care Education/Training Program

## 2022-02-12 DIAGNOSIS — R1013 Epigastric pain: Secondary | ICD-10-CM

## 2022-02-12 MED ORDER — IOPAMIDOL 76 % IV SOLN
76 % | Freq: Once | INTRAVENOUS | Status: AC | PRN
Start: 2022-02-12 — End: 2022-02-12
  Administered 2022-02-12: 19:00:00 100 mL via INTRAVENOUS

## 2022-02-12 NOTE — Other (Signed)
Jacqueline Mckinney was notified of the CT abdomen results.  Is in agreement with having a diagnostic mammogram of right breast.   She would like to go to [place] because it is closer and fears to travel too far.

## 2022-02-12 NOTE — Other (Signed)
[facility] order for Mammogram Diagnostic Unilateral Right Breast was faxed to [phone].  Requesting them to call her.

## 2022-02-16 ENCOUNTER — Encounter

## 2022-02-18 ENCOUNTER — Encounter

## 2022-02-22 NOTE — Telephone Encounter (Signed)
PCP: Annie Sable, MD    Last appt: @LASTENCTHISDEPEXT$ @  Future Appointments   Date Time Provider [place]   03/09/2022  9:00 AM H[place], Megan Salon, MD [facility] AMB       Requested Prescriptions     Pending Prescriptions Disp Refills    hydroCHLOROthiazide (HYDRODIURIL) 25 MG tablet [Pharmacy Med Name: hydroCHLOROthiazide 25 MG Oral Tablet] 90 tablet 0     Sig: Take 1 tablet by mouth once daily       Prior labs and Blood pressures:  BP Readings from Last 3 Encounters:   01/21/22 124/81   10/15/21 (!) 146/100   09/03/21 129/86     Lab Results   Component Value Date/Time    NA 137 01/21/2022 11:31 AM    K 3.9 01/21/2022 11:31 AM    CL 103 01/21/2022 11:31 AM    CO2 32 01/21/2022 11:31 AM    BUN 20 01/21/2022 11:31 AM    GFRAA >60 04/17/2020 09:53 AM     No results found for: "HBA1C", "HBA1CPOC"  Lab Results   Component Value Date/Time    CHOL 213 01/21/2022 11:31 AM    HDL 55 01/21/2022 11:31 AM    VLDL 42 07/29/2020 12:00 AM     No results found for: "VITD3", "VD3RIA"    Lab Results   Component Value Date/Time    TSH 7.99 05/14/2021 12:05 PM

## 2022-02-22 NOTE — Telephone Encounter (Signed)
PCP: Annie Sable, MD    Last appt: @LASTENCTHISDEPEXT$ @  Future Appointments   Date Time Provider [place]   03/09/2022  9:00 AM Hector [place], Megan Salon, MD [facility] AMB       Requested Prescriptions     Pending Prescriptions Disp Refills    levothyroxine (SYNTHROID) 125 MCG tablet [Pharmacy Med Name: Levothyroxine Sodium 125 MCG Oral Tablet] 90 tablet 0     Sig: Take 1 tablet by mouth once daily       Prior labs and Blood pressures:  BP Readings from Last 3 Encounters:   01/21/22 124/81   10/15/21 (!) 146/100   09/03/21 129/86     Lab Results   Component Value Date/Time    NA 137 01/21/2022 11:31 AM    K 3.9 01/21/2022 11:31 AM    CL 103 01/21/2022 11:31 AM    CO2 32 01/21/2022 11:31 AM    BUN 20 01/21/2022 11:31 AM    GFRAA >60 04/17/2020 09:53 AM     No results found for: "HBA1C", "HBA1CPOC"  Lab Results   Component Value Date/Time    CHOL 213 01/21/2022 11:31 AM    HDL 55 01/21/2022 11:31 AM    VLDL 42 07/29/2020 12:00 AM     No results found for: "VITD3", "VD3RIA"    Lab Results   Component Value Date/Time    TSH 7.99 05/14/2021 12:05 PM

## 2022-02-23 NOTE — Telephone Encounter (Signed)
PCP: Annie Sable, MD    Last appt: @[email or web address]$ @  Future Appointments   Date Time Provider [place]   03/09/2022  9:00 AM Hector Brunswick, Megan Salon, MD [facility] AMB       Requested Prescriptions     Pending Prescriptions Disp Refills    fluticasone (FLONASE) 50 MCG/ACT nasal spray [Pharmacy Med Name: Fluticasone Propionate 50 MCG/ACT Nasal Suspension] 16 g 0     Sig: Use 2 spray(s) in each nostril once daily       Prior labs and Blood pressures:  BP Readings from Last 3 Encounters:   01/21/22 124/81   10/15/21 (!) 146/100   09/03/21 129/86     Lab Results   Component Value Date/Time    NA 137 01/21/2022 11:31 AM    K 3.9 01/21/2022 11:31 AM    CL 103 01/21/2022 11:31 AM    CO2 32 01/21/2022 11:31 AM    BUN 20 01/21/2022 11:31 AM    GFRAA >60 04/17/2020 09:53 AM     No results found for: "HBA1C", "HBA1CPOC"  Lab Results   Component Value Date/Time    CHOL 213 01/21/2022 11:31 AM    HDL 55 01/21/2022 11:31 AM    VLDL 42 07/29/2020 12:00 AM     No results found for: "VITD3", "VD3RIA"    Lab Results   Component Value Date/Time    TSH 7.99 05/14/2021 12:05 PM

## 2022-02-24 MED ORDER — LEVOTHYROXINE SODIUM 125 MCG PO TABS
125 MCG | ORAL_TABLET | Freq: Every day | ORAL | 1 refills | Status: AC
Start: 2022-02-24 — End: 2022-08-05

## 2022-02-24 MED ORDER — HYDROCHLOROTHIAZIDE 25 MG PO TABS
25 MG | ORAL_TABLET | Freq: Every day | ORAL | 1 refills | Status: AC
Start: 2022-02-24 — End: 2022-08-05

## 2022-02-24 MED ORDER — FLUTICASONE PROPIONATE 50 MCG/ACT NA SUSP
50 MCG/ACT | NASAL | 2 refills | Status: AC
Start: 2022-02-24 — End: 2022-08-05

## 2022-03-07 ENCOUNTER — Ambulatory Visit: Payer: BLUE CROSS/BLUE SHIELD | Primary: Student in an Organized Health Care Education/Training Program

## 2022-03-09 ENCOUNTER — Ambulatory Visit
Admit: 2022-03-09 | Payer: BLUE CROSS/BLUE SHIELD | Attending: Student in an Organized Health Care Education/Training Program | Primary: Student in an Organized Health Care Education/Training Program

## 2022-03-09 DIAGNOSIS — R1013 Epigastric pain: Secondary | ICD-10-CM

## 2022-03-09 MED ORDER — AMOXICILLIN-POT CLAVULANATE 875-125 MG PO TABS
875-125 | ORAL_TABLET | Freq: Two times a day (BID) | ORAL | 0 refills | Status: AC
Start: 2022-03-09 — End: 2022-03-16

## 2022-03-09 NOTE — Progress Notes (Signed)
Identified pt with two pt identifiers(name and DOB). Reviewed record in preparation for visit and have obtained necessary documentation.  Chief Complaint   Patient presents with    1 Month Follow-Up     CT scan of abdomen         Health Maintenance Due   Topic    Hepatitis B vaccine (1 of 3 - 3-dose series)    HIV screen     DTaP/Tdap/Td vaccine (1 - Tdap)    Flu vaccine (1)    COVID-19 Vaccine (3 - 2023-24 season)    Shingles vaccine (2 of 3)       Coordination of Care Questionnaire:  :   1) Have you been to an emergency room, urgent care, or hospitalized since your last visit?  If yes, where when, and reason for visit? no      2. Have seen or consulted any other health care provider since your last visit?   If yes, where when, and reason for visit?  no        Patient is accompanied by self I have received verbal consent from Jacqueline Mckinney to discuss any/all medical information while they are present in the room.

## 2022-03-09 NOTE — Progress Notes (Signed)
Assessment/Plan:     Diagnoses and all orders for this visit:    Epigastric pain  -Chronic intermittent. Unchanged.  -Recent lab work was relatively unremarkable as well as CT abdomen  -Has an appointment later this week with gastroenterology for further evaluation  -Continue pantoprazole daily  -Also on CT scan there was an incidental calcification of the breast.  A diagnostic mammogram was ordered however patient states that she was unable to get this done due to not being due.  Nursing will try to contact imaging center to determine why.    Acute non-recurrent sinusitis, unspecified location  -     amoxicillin-clavulanate (AUGMENTIN) 875-125 MG per tablet; Take 1 tablet by mouth in the morning and 1 tablet in the evening. Do all this for 7 days.  -2-week history of symptoms consistent with acute sinusitis  -Given duration of symptoms will treat with Augmentin twice daily  -Continue with Flonase as needed  -Return precautions discussed         Return in about 3 months (around 06/07/2022), or if symptoms worsen or fail to improve.     Discussed expected course/resolution/complications of diagnosis in detail with patient.    Medication risks/benefits/costs/interactions/alternatives discussed with patient.    Pt expressed understanding with the diagnosis and plan      Subjective:      Jacqueline Mckinney is a 57 y.o. female who presents for had concerns including 1 Month Follow-Up (CT scan of abdomen ).     Current Outpatient Medications   Medication Sig Dispense Refill    amoxicillin-clavulanate (AUGMENTIN) 875-125 MG per tablet Take 1 tablet by mouth in the morning and 1 tablet in the evening. Do all this for 7 days. 14 tablet 0    levothyroxine (SYNTHROID) 125 MCG tablet Take 1 tablet by mouth every morning (before breakfast) 90 tablet 1    hydroCHLOROthiazide (HYDRODIURIL) 25 MG tablet Take 1 tablet by mouth once daily 90 tablet 1    fluticasone (FLONASE) 50 MCG/ACT nasal spray Use 2 spray(s) in each nostril once  daily 1 each 2    pantoprazole (PROTONIX) 40 MG tablet Take 1 tablet by mouth every morning (before breakfast) 90 tablet 0    vitamin D3 (CHOLECALCIFEROL) 25 MCG (1000 UT) TABS tablet Take 1 tablet by mouth daily 90 tablet 1    magnesium (MAGNESIUM-OXIDE) 250 MG TABS tablet Take 1 tablet by mouth daily      cetirizine (ZYRTEC) 10 MG tablet Take 1 tablet by mouth daily      hydrocortisone (PROCTOSOL HC) 2.5 % CREA rectal cream Apply BID as as needed externally for up to 1 week 28 g 0     No current facility-administered medications for this visit.       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[facility]    TONSILLECTOMY AND ADENOIDECTOMY        Family  History   Problem Relation Age of Onset    Diabetes Mother       Social History     Socioeconomic History    Marital status: Single     Spouse name: Not on file    Number of children: Not on file    Years of education: Not on file    Highest education level: Not on file   Occupational History    Not on file   Tobacco Use    Smoking status: Never    Smokeless tobacco: Never   Vaping Use    Vaping Use: Never used   Substance and Sexual Activity    Alcohol use: No    Drug use: No    Sexual activity: Not on file   Other Topics Concern    Not on file   Social History Narrative    Not on file     Social Determinants of Health     Financial Resource Strain: Low Risk  (01/21/2022)    Overall Financial Resource Strain (CARDIA)     Difficulty of Paying Living Expenses: Not hard at all   Food Insecurity: No Food Insecurity (01/21/2022)    Hunger Vital  Sign     Worried About Running Out of Food in the Last Year: Never true     [place] in the Last Year: Never true   Transportation Needs: No Transportation Needs (01/21/2022)    PRAPARE - Armed forces logistics/support/administrative officer (Medical): No     Lack of Transportation (Non-Medical): No   Physical Activity: Patient Declined (04/21/2021)    Exercise Vital Sign     Days of Exercise per Week: Patient declined     Minutes of Exercise per Session: Patient declined   Stress: Not on file   Social Connections: Not on file   Intimate Partner Violence: Not on file   Housing Stability: Unknown (01/21/2022)    Housing Stability Vital Sign     Unable to Pay for Housing in the Last Year: No     Number of Places Lived in the Last Year: Not on file     Unstable Housing in the Last Year: No        Patient is a 57 year old female who presents to the office today for follow-up of epigastric pain as well as concerns for sinus congestion. Patient has a 7+ month history of intermittent epigastric abdominal pain. She also has a known history of hemorrhoids.  She states with the epigastric pain is most notable after she eats.  She has been using pantoprazole but this has not provided any significant benefit. The pain is stable and unchanged.  She did have a recent CT scan that showed an incidental breast calcification as well as an incidental fatty liver.  Otherwise there were no acute changes.  She denies any change in symptoms.  She denies any blood in the stool or change in bowel movements.  She does state that for the last 2 weeks she has had sinus pressure and congestion. She has been trying Flonase and Claritin without any improvement.  She denies any fever, chills, chest pain, shortness of breath or any other acute concerns.  Of note she does state that the cardiologist completed a stress test that was normal.  She also has an appointment with gastroenterology on March 1st.          ROS:   Review of Systems   Constitutional:  Negative for chills and fever.   HENT:  Positive for congestion and sinus pressure.    Respiratory:  Negative for cough and shortness of breath.    Cardiovascular:  Negative for chest pain and leg swelling.   Gastrointestinal:  Negative for abdominal pain, nausea and vomiting.   Neurological:  Negative for dizziness, weakness, numbness and headaches.         Objective:     Vitals:    03/09/22 0905   BP: 128/83   Pulse: 72   Resp: 16   Temp: 96.9 F (36.1 C)   SpO2: 99%          Vitals and Nurse Documentation reviewed.     Physical Exam  Constitutional:       General: She is not in acute distress.     Appearance: Normal appearance. She is not ill-appearing.   HENT:      Head: Normocephalic and atraumatic.   Eyes:      Conjunctiva/sclera: Conjunctivae normal.   Cardiovascular:      Rate and Rhythm: Normal rate and regular rhythm.      Heart sounds: Normal heart sounds. No murmur heard.     No friction rub. No gallop.   Pulmonary:      Effort: Pulmonary effort is normal. No respiratory distress.      Breath sounds: Normal breath sounds. No wheezing, rhonchi or rales.   Abdominal:      General: Bowel sounds are normal. There is no distension.      Palpations: Abdomen is soft.      Tenderness: There is abdominal tenderness (Minimal tenderness to palpation in the epigastric region only, remainder of abdomen nontender to palpation). There is no guarding or rebound.   Musculoskeletal:      Cervical back: Neck supple.      Right lower leg: No edema.      Left lower leg: No edema.   Neurological:      Mental Status: She is alert and oriented to person, place, and time.      Gait: Gait normal.         No results found for this visit on 03/09/22.

## 2022-04-07 ENCOUNTER — Ambulatory Visit
Payer: BLUE CROSS/BLUE SHIELD | Attending: Student in an Organized Health Care Education/Training Program | Primary: Student in an Organized Health Care Education/Training Program

## 2022-06-01 ENCOUNTER — Inpatient Hospital Stay: Payer: BLUE CROSS/BLUE SHIELD | Attending: Gastroenterology

## 2022-06-01 MED ORDER — PROPOFOL 500 MG/50ML IV EMUL
500 | INTRAVENOUS | Status: AC
Start: 2022-06-01 — End: ?

## 2022-06-01 MED ORDER — NORMAL SALINE FLUSH 0.9 % IV SOLN
0.9 | INTRAVENOUS | Status: DC | PRN
Start: 2022-06-01 — End: 2022-06-01

## 2022-06-01 MED ORDER — SODIUM CHLORIDE 0.9 % IV SOLN
0.9 | INTRAVENOUS | Status: DC | PRN
Start: 2022-06-01 — End: 2022-06-01
  Administered 2022-06-01: 20:00:00 via INTRAVENOUS

## 2022-06-01 MED ORDER — LIDOCAINE (CARDIAC) 100 MG/5ML IV SOLN (MIXTURES ONLY)
100 | INTRAVENOUS | Status: AC
Start: 2022-06-01 — End: ?

## 2022-06-01 MED ORDER — LIDOCAINE (CARDIAC) 100 MG/5ML IV SOLN (MIXTURES ONLY)
100 MG/5ML | INTRAVENOUS | Status: DC | PRN
  Administered 2022-06-01: 20:00:00 50 via INTRAVENOUS

## 2022-06-01 MED ORDER — NORMAL SALINE FLUSH 0.9 % IV SOLN
0.9 | Freq: Two times a day (BID) | INTRAVENOUS | Status: DC
Start: 2022-06-01 — End: 2022-06-01

## 2022-06-01 MED ORDER — PROPOFOL 500 MG/50ML IV EMUL
500 MG/50ML | INTRAVENOUS | Status: DC | PRN
  Administered 2022-06-01 (×2): 50 via INTRAVENOUS

## 2022-06-01 MED ORDER — DEXMEDETOMIDINE HCL 200 MCG/2ML IV SOLN
200 MCG/2ML | INTRAVENOUS | Status: DC | PRN
  Administered 2022-06-01: 20:00:00 6 via INTRAVENOUS

## 2022-06-01 MED ORDER — DEXMEDETOMIDINE HCL 200 MCG/2ML IV SOLN
200 | INTRAVENOUS | Status: AC
Start: 2022-06-01 — End: ?

## 2022-06-01 MED FILL — BD POSIFLUSH 0.9 % IV SOLN: 0.9 % | INTRAVENOUS | Qty: 40

## 2022-06-01 MED FILL — DEXMEDETOMIDINE HCL 200 MCG/2ML IV SOLN: 200 MCG/2ML | INTRAVENOUS | Qty: 2

## 2022-06-01 MED FILL — DIPRIVAN 500 MG/50ML IV EMUL: 500 MG/50ML | INTRAVENOUS | Qty: 50

## 2022-06-01 MED FILL — LIDOCAINE HCL (CARDIAC) 100 MG/5ML IV SOSY: 100 MG/5ML | INTRAVENOUS | Qty: 5

## 2022-06-01 NOTE — Procedures (Signed)
[place] - [facility]  Marisa Hua, M.D.  [phone]           06/01/2022                EGD Operative Report  Jacqueline Mckinney  DOB:  1965-08-15  [place] Medical Record Number:  [id number]      Indication: Epigastric pain     Operator: Marisa Hua, MD    Assistant -- None  Implants -- None    Referring Provider:  [name], MD      Anesthesia/Sedation:  MAC anesthesia Propofol    Airway assessment: No airway problems anticipated    Pre-Procedural Exam:      Airway: clear, no airway problems anticipated  Heart: RRR, without gallops or rubs  Lungs: clear bilaterally without wheezes, crackles, or rhonchi  Abdomen: soft, nontender, nondistended, bowel sounds present  Mental Status: awake, alert and oriented to person, place and time       Procedure Details     After infomed consent was obtained for the procedure, with all risks and benefits of procedure explained the patient was taken to the endoscopy suite and placed in the left lateral decubitus position.  Following sequential administration of sedation as per above, the endoscope was inserted into the mouth and advanced under direct vision to second portion of the duodenum.  A careful inspection was made as the gastroscope was withdrawn, including a retroflexed view of the proximal stomach; findings and interventions are described below.      Findings:   Esophagus:normal  Stomach: normal   Duodenum/jejunum: normal    Therapies:  biopsy of stomach   biopsy of duodenal     Specimens:  as above           Complications:   None; patient tolerated the procedure well.    EBL:  None.           Impression:    Normal exam    Recommendations:    -Await pathology.  -Follow up with Dr. Maple Hudson as scheduled    Marisa Hua, MD

## 2022-06-01 NOTE — H&P (Signed)
[place] - [facility]  Marisa Hua, M.D.  [phone]            History and Physical       NAME:  Jacqueline Mckinney   DOB:   18-Feb-1965   MRN:   [id number]       Referring Physician:  [name], MD      Consult Date: 06/01/2022 2:56 PM    Chief Complaint:  epigastric pain    History of Present Illness:       Jacqueline Mckinney  Appointment: 04/05/2022 10:30 AM  Location: [facility]  Patient #: [id number]  DOB: [birth year]  Single / Language: Other / Race: Refused to Report/Unreported  Female   History of Present Illness (Christopher L. Young MD; 04/05/2022 11:28 AM)   The patient is a 57 year old female who presents with a complaint of Hemorrhoids. Patient is a 57 year old female who I originally saw in the office earlier this month.  She presented today for hemorrhoid banding however has complaints of very severe epigastric abdominal pain.  She did undergo CT scan in February 2024 which was largely unremarkable other than fatty liver.  She has a history of Helicobacter pylori related gastritis.  Does state that she takes an acid blocking medication.  Had prescribed her sucralfate after our last visit but she states that this has not helped with her difficult to control abdominal pain.  She has had prior GI care both here and with Dr. Nedra Hai with most recent upper endoscopy in 2020.  Does have a history of Helicobacter related gastritis but this had confirmed eradication previously.  She is very frustrated by her abdominal symptoms.  She no longer complains of any hemorrhoidal symptoms.      Thus, we decided not to proceed with hemorrhoid banding.  She denies any bleeding.  Having regular bowel movements.    She endorses significant frustration at her symptoms and her care.         PMH:  Past Medical History:   Diagnosis Date    H/O seasonal allergies     Hyperlipidemia     Hypertension     Hypothyroid     Ill-defined condition 2005    ependyoma resection  2005, s/p radiation at [facility]    Impaired  glucose tolerance        PSH:  Past Surgical History:   Procedure Laterality Date    EGD DELIVER THERMAL ENERGY SPHNCTR/CARDIA GERD      OTHER SURGICAL HISTORY      ependyoma resection  2005, s/p radiation at [facility]    TONSILLECTOMY AND ADENOIDECTOMY         Allergies:  Allergies   Allergen Reactions    Bee Pollen Other (See Comments)       Home Medications:  Prior to Admission Medications   Prescriptions Last Dose Informant Patient Reported? Taking?   cetirizine (ZYRTEC) 10 MG tablet   Yes No   Sig: Take 1 tablet by mouth daily   fluticasone (FLONASE) 50 MCG/ACT nasal spray   No No   Sig: Use 2 spray(s) in each nostril once daily   hydroCHLOROthiazide (HYDRODIURIL) 25 MG tablet   No No   Sig: Take 1 tablet by mouth once daily   hydrocortisone (PROCTOSOL HC) 2.5 % CREA rectal cream   No No   Sig: Apply BID as as needed externally for up to 1 week   levothyroxine (SYNTHROID) 125 MCG tablet   No No  Sig: Take 1 tablet by mouth every morning (before breakfast)   magnesium (MAGNESIUM-OXIDE) 250 MG TABS tablet   Yes No   Sig: Take 1 tablet by mouth daily   pantoprazole (PROTONIX) 40 MG tablet   No No   Sig: Take 1 tablet by mouth every morning (before breakfast)   vitamin D3 (CHOLECALCIFEROL) 25 MCG (1000 UT) TABS tablet   No No   Sig: Take 1 tablet by mouth daily      Facility-Administered Medications: None       Hospital Medications:  No current facility-administered medications for this encounter.       Social History:  Social History     Tobacco Use    Smoking status: Never    Smokeless tobacco: Never   Substance Use Topics    Alcohol use: No       Family History:  Family History   Problem Relation Age of Onset    Diabetes Mother              Review of Systems:      Constitutional: negative fever, negative chills, negative weight loss  Eyes:   negative visual changes  ENT:   negative sore throat, tongue or lip swelling  Respiratory:  negative cough, negative dyspnea  Cards:  negative for chest pain, palpitations, lower  extremity edema  GI:   See HPI  GU:  negative for frequency, dysuria  Integument:  negative for rash and pruritus  Heme:  negative for easy bruising and gum/nose bleeding  Musculoskel: negative for myalgias,  back pain and muscle weakness  Neuro: negative for headaches, dizziness, vertigo  Psych:  negative for feelings of anxiety, depression       Objective:   No data found.  No intake/output data recorded.  No intake/output data recorded.    EXAM:     NEURO-a&o   HEENT-wnl   LUNGS-clear    COR-regular rate and rhythym     ABD-soft , no tenderness, no rebound, good bs     EXT-no edema     Data Review     No results for input(s): "WBC", "HGB", "HCT", "PLT" in the last 72 hours.  No results for input(s): "NA", "K", "CL", "CO2", "BUN", "GLU", "PHOS" in the last 72 hours.    Invalid input(s): "CREA", "CA"  No results for input(s): "TP", "GLOB", "GGT" in the last 72 hours.    Invalid input(s): "SGOT", "GPT", "AP", "TBIL", "ALB", "AML", "AMYP", "LPSE", "HLPSE"  No results for input(s): "INR", "APTT" in the last 72 hours.    Invalid input(s): "PTP"    Patient Active Problem List   Diagnosis    Rash    Acquired hypothyroidism    Essential hypertension    Prediabetes    History of brain tumor    Vitamin D deficiency    Paresthesia of bilateral legs    Mixed hyperlipidemia    Hepatic steatosis      Assessment:     Epigastric pain   Plan:     EGD today.     Signed By: Marisa Hua, MD     06/01/2022  2:56 PM

## 2022-06-01 NOTE — Anesthesia Post-Procedure Evaluation (Signed)
Department of Anesthesiology  Postprocedure Note    Patient: Jacqueline Mckinney  MRN: [id number]  Birthdate: [birth year]  Date of evaluation: 06/01/2022    Procedure Summary       Date: 06/01/22 Room / Location: [facility] / [facility]    Anesthesia Start: 1542 Anesthesia Stop: 1554    Procedure: ESOPHAGOGASTRODUODENOSCOPY with biopsy (Upper GI Region) Diagnosis:       Epigastric pain      (Epigastric pain [R10.13])    Surgeons: Marisa Hua, MD Responsible Provider: Cherlynn Kaiser, MD    Anesthesia Type: MAC ASA Status: 2            Anesthesia Type: No value filed.    Aldrete Phase I: Aldrete Score: 10    Aldrete Phase II: Aldrete Score: 9    Anesthesia Post Evaluation    Patient location during evaluation: PACU  Patient participation: complete - patient participated  Level of consciousness: awake  Pain score: 0  Airway patency: patent  Nausea & Vomiting: no nausea and no vomiting  Cardiovascular status: blood pressure returned to baseline  Respiratory status: acceptable  Hydration status: euvolemic  Pain management: adequate    No notable events documented.

## 2022-06-01 NOTE — Other (Signed)
Received recovery report from anesthesia team, see anesthesia note. Abdomen remains soft and non-tender post-procedure. Pt has no complaints at this time and tolerated procedure well. Endoscope was pre-cleaned at the bedside by Will Lyons immediately following procedure. Post recovery report given to Suzanne Tharin, RN.

## 2022-06-01 NOTE — Progress Notes (Signed)
Patient does not have an appointment with Dr Maple Hudson- does not know who Dr Maple Hudson is- instructed to follow up with Dr Ruffin Frederick or a stomach (GI) doctor of her choice for more testing of her abdominal pain,

## 2022-06-01 NOTE — Anesthesia Pre-Procedure Evaluation (Signed)
Department of Anesthesiology  Preprocedure Note       Name:  Jacqueline Mckinney   Age:  57 y.o.  DOB:  08/30/1965                                          MRN:  [id number]         Date:  06/01/2022      Surgeon: Moishe Spice):  Marisa Hua, MD    Procedure: Procedure(s):  ESOPHAGOGASTRODUODENOSCOPY    Medications prior to admission:   Prior to Admission medications    Medication Sig Start Date End Date Taking? Authorizing Provider   levothyroxine (SYNTHROID) 125 MCG tablet Take 1 tablet by mouth every morning (before breakfast) 02/24/22   Vernell Barrier, Eliberto Ivory, MD   hydroCHLOROthiazide (HYDRODIURIL) 25 MG tablet Take 1 tablet by mouth once daily 02/24/22   [name], MD   fluticasone [facility]) 50 MCG/ACT nasal spray Use 2 spray(s) in each nostril once daily 02/24/22   [name], MD   pantoprazole (PROTONIX) 40 MG tablet Take 1 tablet by mouth every morning (before breakfast) 01/21/22   Vernell Barrier, Eliberto Ivory, MD   hydrocortisone (PROCTOSOL HC) 2.5 % CREA rectal cream Apply BID as as needed externally for up to 1 week 10/15/21   [name], MD   vitamin D3 (CHOLECALCIFEROL) 25 MCG (1000 UT) TABS tablet Take 1 tablet by mouth daily 09/03/21   [name], MD   magnesium (MAGNESIUM-OXIDE) 250 MG TABS tablet Take 1 tablet by mouth daily    [provider]   cetirizine (ZYRTEC) 10 MG tablet Take 1 tablet by mouth daily 04/21/21   [provider]       Current medications:    No current facility-administered medications for this encounter.       Allergies:    Allergies   Allergen Reactions   . Bee Pollen Other (See Comments)       Problem List:    Patient Active Problem List   Diagnosis Code   . Rash R21   . Acquired hypothyroidism E03.9   . Essential hypertension I10   . Prediabetes R73.03   . History of brain tumor Z87.898   . Vitamin D deficiency E55.9   . Paresthesia of bilateral legs R20.2   . Mixed hyperlipidemia E78.2   . Hepatic steatosis K76.0       Past Medical History:         Diagnosis Date   . H/O seasonal allergies    . Hyperlipidemia    . Hypertension    . Hypothyroid    . Ill-defined condition 2005    ependyoma resection  2005, s/p radiation at [facility]   . Impaired glucose tolerance        Past Surgical History:        Procedure Laterality Date   . EGD DELIVER THERMAL ENERGY SPHNCTR/CARDIA GERD     . OTHER SURGICAL HISTORY      ependyoma resection  2005, s/p radiation at [facility]   . TONSILLECTOMY AND ADENOIDECTOMY         Social History:    Social History     Tobacco Use   . Smoking status: Never   . Smokeless tobacco: Never   Substance Use Topics   . Alcohol use: No  Counseling given: Not Answered      Vital Signs (Current): There were no vitals filed for this visit.                                           BP Readings from Last 3 Encounters:   03/09/22 128/83   01/21/22 124/81   10/15/21 (!) 146/100       NPO Status:                                                                                 BMI:   Wt Readings from Last 3 Encounters:   03/09/22 74.4 kg (164 lb)   01/21/22 74.8 kg (165 lb)   10/15/21 75.3 kg (166 lb)     There is no height or weight on file to calculate BMI.    CBC:   Lab Results   Component Value Date/Time    WBC 4.9 01/21/2022 11:31 AM    RBC 4.99 01/21/2022 11:31 AM    HGB 12.1 01/21/2022 11:31 AM    HCT 39.6 01/21/2022 11:31 AM    MCV 79.4 01/21/2022 11:31 AM    RDW 13.0 01/21/2022 11:31 AM    PLT 220 01/21/2022 11:31 AM       CMP:   Lab Results   Component Value Date/Time    NA 137 01/21/2022 11:31 AM    K 3.9 01/21/2022 11:31 AM    CL 103 01/21/2022 11:31 AM    CO2 32 01/21/2022 11:31 AM    BUN 20 01/21/2022 11:31 AM    CREATININE 0.65 01/21/2022 11:31 AM    GFRAA >60 04/17/2020 09:53 AM    AGRATIO 1.3 04/21/2021 01:36 PM    LABGLOM >60 01/21/2022 11:31 AM    LABGLOM >60 04/21/2021 01:36 PM    GLUCOSE 87 01/21/2022 11:31 AM    CALCIUM 9.6 01/21/2022 11:31 AM    BILITOT 1.6 01/21/2022 11:31 AM    ALKPHOS 95 01/21/2022 11:31 AM     ALKPHOS 91 04/21/2021 01:36 PM    AST 28 01/21/2022 11:31 AM    ALT 38 01/21/2022 11:31 AM       POC Tests: No results for input(s): "POCGLU", "POCNA", "POCK", "POCCL", "POCBUN", "POCHEMO", "POCHCT" in the last 72 hours.    Coags: No results found for: "PROTIME", "INR", "APTT"    HCG (If Applicable): No results found for: "PREGTESTUR", "PREGSERUM", "HCG", "HCGQUANT"     ABGs: No results found for: "PHART", "PO2ART", "PCO2ART", "HCO3ART", "BEART", "O2SATART"     Type & Screen (If Applicable):  No results found for: "LABABO"    Drug/Infectious Status (If Applicable):  Lab Results   Component Value Date/Time    HEPCAB <0.1 09/14/2017 12:29 PM       COVID-19 Screening (If Applicable):   Lab Results   Component Value Date/Time    COVID19 Not detected 11/01/2019 03:03 PM           Anesthesia Evaluation  Patient summary reviewed and Nursing notes reviewed  Airway: Mallampati: II  TM distance: >3 FB   Neck ROM: full  Dental: normal exam         Pulmonary: breath sounds clear to auscultation                             Cardiovascular:  Exercise tolerance: good (>4 METS)  (+) hypertension:        Rhythm: regular  Rate: normal                    Neuro/Psych:               GI/Hepatic/Renal:   (+) liver disease:          Endo/Other:    (+) hypothyroidism::..                 Abdominal:             Vascular:          Other Findings:       Anesthesia Plan      MAC     ASA 2       Induction: intravenous.      Anesthetic plan and risks discussed with patient.                    Cherlynn Kaiser, MD   06/01/2022

## 2022-06-01 NOTE — Discharge Instructions (Addendum)
[place] - [facility]  Marisa Hua M.D.  [phone]         EGD DISCHARGE INSTRUCTIONS      AGAPE EDMUNDSON  [id number]  01-31-1965    DISCOMFORT:  Sore throat- throat lozenges or warm salt water gargle  Redness at IV site- apply warm compress to area; if redness or soreness persist- contact your physician  Gaseous discomfort- walking, belching will help relieve any discomfort  You may not operate a vehicle for 12 hours  You may not engage in an occupation involving machinery or appliances for the  rest of today  You may not drink alcoholic beverages for at least 12 hours  Avoid making any critical decisions for at least 24 hours    DIET:   You may resume your normal diet, but some patients find that heavy or large  meals may lead to indigestion or vomiting. I suggest a light meal as first food  intake.    ACTIVITY:  You may resume your normal daily activities.  It is recommended that you spend the remainder of the day resting - avoid any strenuous activity.    CALL M.D. IF ANY SIGN OF:   Increasing pain, nausea, vomiting  Abdominal distension (swelling)  Significant bleeding (oral or rectal)  Fever   Pain in chest area  Shortness of breath    Additional Instructions:   Call Dr. Ruffin Frederick if any questions or problems at [phone]    Impression:    Normal exam     Recommendations:   -Await pathology.  -Follow up with Dr. Maple Hudson as scheduled   Setup follow-up office visit with Dr. Maple Hudson as scheduled  You should receive the biopsy results by phone or mail within 3 weeks, if not, call my office for the results

## 2022-06-01 NOTE — Progress Notes (Signed)
Endoscopy discharge instructions have been reviewed and given to patient.  The patient verbalized understanding and acceptance of instructions.      Dr. Dorie Rank discussed with daughter procedure findings and next steps.

## 2022-07-27 NOTE — Telephone Encounter (Signed)
Appt made

## 2022-07-27 NOTE — Telephone Encounter (Signed)
-----   Message from [facility] sent at 07/22/2022  4:01 PM EDT -----  Regarding: ECC Escalation To Practice  ECC Escalation To Practice      Type of Escalation: Acute Care Symptom  --------------------------------------------------------------------------------------------------------------------------    Information for Provider:  Patient is looking for appointment for: Symptom feeling down/ depression  Reasons for Message: Unable to reach practice     Additional Information : Unable to reach practice since it was closed, patient feeling down or have some depression she preferred appointment on July 17 at 1 pm with her PCP Jacqueline Mckinney, Jacqueline Ivory, MD.  --------------------------------------------------------------------------------------------------------------------------    Relationship to Patient: Self     Call Back Info: OK to leave message on voicemail  Preferred Call Back Number: Phone [phone] (home)

## 2022-08-05 ENCOUNTER — Ambulatory Visit
Admit: 2022-08-05 | Payer: BLUE CROSS/BLUE SHIELD | Attending: Student in an Organized Health Care Education/Training Program | Primary: Student in an Organized Health Care Education/Training Program

## 2022-08-05 DIAGNOSIS — I10 Essential (primary) hypertension: Secondary | ICD-10-CM

## 2022-08-05 LAB — IRON AND TIBC
Iron % Saturation: 23 % (ref 20–50)
Iron: 92 ug/dL (ref 35–150)
TIBC: 393 ug/dL (ref 250–450)

## 2022-08-05 LAB — COMPREHENSIVE METABOLIC PANEL
ALT: 36 U/L (ref 12–78)
AST: 29 U/L (ref 15–37)
Albumin/Globulin Ratio: 1.2 (ref 1.1–2.2)
Albumin: 4.2 g/dL (ref 3.5–5.0)
Alk Phosphatase: 114 U/L (ref 45–117)
Anion Gap: 5 mmol/L (ref 5–15)
BUN/Creatinine Ratio: 27 — ABNORMAL HIGH (ref 12–20)
BUN: 17 MG/DL (ref 6–20)
CO2: 31 mmol/L (ref 21–32)
Calcium: 9.8 MG/DL (ref 8.5–10.1)
Chloride: 100 mmol/L (ref 97–108)
Creatinine: 0.63 MG/DL (ref 0.55–1.02)
Est, Glom Filt Rate: >90 mL/min/{1.73_m2} (ref >60)
Globulin: 3.6 g/dL (ref 2.0–4.0)
Glucose: 91 mg/dL (ref 65–100)
Potassium: 3.9 mmol/L (ref 3.5–5.1)
Sodium: 136 mmol/L (ref 136–145)
Total Bilirubin: 1.3 MG/DL — ABNORMAL HIGH (ref 0.2–1.0)
Total Protein: 7.8 g/dL (ref 6.4–8.2)

## 2022-08-05 LAB — CBC WITH AUTO DIFFERENTIAL
Basophils %: 1 % (ref 0–1)
Basophils Absolute: 0.1 10*3/uL (ref 0.0–0.1)
Eosinophils %: 3 % (ref 0–7)
Eosinophils Absolute: 0.1 10*3/uL (ref 0.0–0.4)
Hematocrit: 41.1 % (ref 35.0–47.0)
Hemoglobin: 12.5 g/dL (ref 11.5–16.0)
Immature Granulocytes %: 0 % (ref 0.0–0.5)
Immature Granulocytes Absolute: 0 10*3/uL (ref 0.00–0.04)
Lymphocytes %: 32 % (ref 12–49)
Lymphocytes Absolute: 1.3 10*3/uL (ref 0.8–3.5)
MCH: 24.7 PG — ABNORMAL LOW (ref 26.0–34.0)
MCHC: 30.4 g/dL (ref 30.0–36.5)
MCV: 81.1 FL (ref 80.0–99.0)
MPV: 12.3 FL (ref 8.9–12.9)
Monocytes %: 12 % (ref 5–13)
Monocytes Absolute: 0.5 10*3/uL (ref 0.0–1.0)
Neutrophils %: 52 % (ref 32–75)
Neutrophils Absolute: 2.1 10*3/uL (ref 1.8–8.0)
Nucleated RBCs: 0 PER 100 WBC
Platelets: 242 10*3/uL (ref 150–400)
RBC: 5.07 M/uL (ref 3.80–5.20)
RDW: 13.2 % (ref 11.5–14.5)
WBC: 4 10*3/uL (ref 3.6–11.0)
nRBC: 0 10*3/uL (ref 0.00–0.01)

## 2022-08-05 LAB — MICROALBUMIN / CREATININE URINE RATIO
Creatinine, Ur: 47.4 mg/dL
Microalb, Ur: 0.51 MG/DL
Microalb/Creat Ratio: 11 mg/g (ref 0–30)

## 2022-08-05 LAB — HEMOGLOBIN A1C
Estimated Avg Glucose: 114 mg/dL
Hemoglobin A1C: 5.6 % (ref 4.0–5.6)

## 2022-08-05 LAB — VITAMIN D 25 HYDROXY: Vit D, 25-Hydroxy: 13.3 ng/mL — ABNORMAL LOW (ref 30–100)

## 2022-08-05 LAB — VITAMIN B12 & FOLATE
Folate: 57.3 ng/mL — ABNORMAL HIGH (ref 5.0–21.0)
Vitamin B-12: 391 pg/mL (ref 193–986)

## 2022-08-05 LAB — TSH: TSH, 3rd Generation: 0.78 u[IU]/mL (ref 0.36–3.74)

## 2022-08-05 MED ORDER — LEVOTHYROXINE SODIUM 125 MCG PO TABS
125 MCG | ORAL_TABLET | Freq: Every day | ORAL | 1 refills | Status: DC
Start: 2022-08-05 — End: 2023-05-15

## 2022-08-05 MED ORDER — HYDROCORTISONE (PERIANAL) 2.5 % EX CREA
2.5 | CUTANEOUS | 0 refills | 25.00000 days | Status: DC
Start: 2022-08-05 — End: 2023-10-03

## 2022-08-05 MED ORDER — FLUTICASONE PROPIONATE 50 MCG/ACT NA SUSP
50 MCG/ACT | NASAL | 2 refills | Status: AC
Start: 2022-08-05 — End: 2023-02-14

## 2022-08-05 MED ORDER — VITAMIN D3 25 MCG (1000 UT) PO TABS
25 | ORAL_TABLET | Freq: Every day | ORAL | 1 refills | Status: DC
Start: 2022-08-05 — End: 2023-10-03

## 2022-08-05 MED ORDER — HYDROCHLOROTHIAZIDE 25 MG PO TABS
25 MG | ORAL_TABLET | Freq: Every day | ORAL | 1 refills | Status: DC
Start: 2022-08-05 — End: 2023-02-24

## 2022-08-05 NOTE — Progress Notes (Signed)
JEANELLA ORDONEZ is a 58 y.o. female    Chief Complaint   Patient presents with    Follow-up     Epigastric pain       BP 134/84   Pulse 78   Temp 97.4 F (36.3 C)   Resp 16   Ht 1.702 m (5\' 7" )   Wt 74.7 kg (164 lb 9.6 oz)   SpO2 98%   BMI 25.78 kg/m         1. Have you been to the ER, urgent care clinic since your last visit?  Hospitalized since your last visit? Yes    2. Have you seen or consulted any other health care providers outside of the [facility] since your last visit?  Include any pap smears or colon screening. No    Learning Assessment:       No data to display                Fall Risk Assessment:      05/14/2021    10:13 AM 11/06/2019    12:19 PM   Amb Fall Risk Assessment and TUG Test   Fall in past 12 months? 0    Able to walk? Yes    Total Score  1       Abuse Screening:      01/21/2022    10:00 AM   AMB Abuse Screening   Do you ever feel afraid of your partner? N   Are you in a relationship with someone who physically or mentally threatens you? N   Is it safe for you to go home? Y       ADL Screening:       No data to display

## 2022-08-05 NOTE — Progress Notes (Signed)
Assessment/Plan:     Diagnoses and all orders for this visit:    Essential (primary) hypertension  -     hydroCHLOROthiazide (HYDRODIURIL) 25 MG tablet; Take 1 tablet by mouth daily  -     CBC with Auto Differential; Future  -     Comprehensive Metabolic Panel; Future  -     Microalbumin / Creatinine Urine Ratio; Future  -Chronic.  Stable.  -Check routine lab work today  -Continue current dosage of hydrochlorothiazide 25 mg daily    Vitamin D deficiency  -     vitamin D3 (CHOLECALCIFEROL) 25 MCG (1000 UT) TABS tablet; Take 1 tablet by mouth daily  -     Vitamin D 25 Hydroxy; Future  -History of vitamin D deficiency  -Repeat vitamin D level  -Continue vitamin D 1000 units daily    Acquired hypothyroidism  -     levothyroxine (SYNTHROID) 125 MCG tablet; Take 1 tablet by mouth every morning (before breakfast)  -     TSH; Future  -Chronic.  Presumed stable.  -Repeat TSH level especially given chronic fatigue  -Continue levothyroxine 125 mcg daily    Allergic rhinitis, unspecified seasonality, unspecified trigger  -     fluticasone (FLONASE) 50 MCG/ACT nasal spray; Use 2 spray(s) in each nostril once daily  -Chronic.  Stable.  -Continue Flonase daily as needed.  Also continue cetirizine as needed for allergies    Prediabetes  -     Hemoglobin A1C; Future  -Previous history of prediabetes  -Repeat hemoglobin A1c    Chronic epigastric pain  -Chronic.  Intermittent.  Unchanged.  -Patient has had recent colonoscopy, EGD and CT scan without any certain etiology noted  -She discontinued pantoprazole stating it did not provide any benefit  -I recommend follow-up with gastroenterology for further evaluation    Hemorrhoids, unspecified hemorrhoid type  -     hydrocortisone (PROCTOSOL HC) 2.5 % CREA rectal cream; Apply BID as needed externally for up to 1 week for hemorrhoids  -History of hemorrhoids.  -Patient endorses intermittent flares  -Will refill hydrocortisone cream for her to use only as needed    Depression, unspecified  depression type  -Patient endorses a history of depression.  PHQ-9 score of 16.  Denies any SI/HI  -Recommend contacting her insurance company to see what therapists are covered in the area.  Recommend CBT  -Also discussed recommendations for medication management.  Patient declines any medication at this time  -Return precautions discussed    Anemia, unspecified type  -     CBC with Auto Differential; Future  -     Vitamin B12 & Folate; Future  -     Iron and TIBC; Future  -Recent lab work from urgent care noted mild anemia  -Repeat CBC along with iron studies and vitamin B12 level  -Patient denies any blood in the stool.  -Patient also recently had a colonoscopy last year       Return in about 3 months (around 11/05/2022), or if symptoms worsen or fail to improve.     Discussed expected course/resolution/complications of diagnosis in detail with patient.    Medication risks/benefits/costs/interactions/alternatives discussed with patient.    Pt expressed understanding with the diagnosis and plan      Subjective:      Jacqueline Mckinney is a 57 y.o. female who presents for had concerns including Follow-up (Epigastric pain).     Current Outpatient Medications   Medication Sig Dispense Refill    NONFORMULARY  Potassium      Multiple Vitamins-Minerals (THERAPEUTIC MULTIVITAMIN-MINERALS) tablet Take 1 tablet by mouth daily      fluticasone (FLONASE) 50 MCG/ACT nasal spray Use 2 spray(s) in each nostril once daily 1 each 2    hydroCHLOROthiazide (HYDRODIURIL) 25 MG tablet Take 1 tablet by mouth daily 90 tablet 1    levothyroxine (SYNTHROID) 125 MCG tablet Take 1 tablet by mouth every morning (before breakfast) 90 tablet 1    vitamin D3 (CHOLECALCIFEROL) 25 MCG (1000 UT) TABS tablet Take 1 tablet by mouth daily 90 tablet 1    hydrocortisone (PROCTOSOL HC) 2.5 % CREA rectal cream Apply BID as needed externally for up to 1 week for hemorrhoids 28 g 0    cetirizine (ZYRTEC) 10 MG tablet Take 1 tablet by mouth daily       No  current facility-administered medications for this visit.       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[facility]    TONSILLECTOMY AND ADENOIDECTOMY      UPPER GASTROINTESTINAL ENDOSCOPY N/A 06/01/2022    ESOPHAGOGASTRODUODENOSCOPY with biopsy performed by Marisa Hua, MD at [facility]COPY    UPPER GASTROINTESTINAL ENDOSCOPY  06/01/2022      Family History   Problem Relation Age of Onset    Diabetes Mother       Social History     Socioeconomic History    Marital status: Single     Spouse name: Not on file    Number of children: Not on file    Years of education: Not on file    Highest education level: Not on file   Occupational History    Not on file   Tobacco Use    Smoking status: Never    Smokeless tobacco: Never   Vaping Use    Vaping Use: Never used   Substance and Sexual Activity    Alcohol use: No    Drug use: No    Sexual activity: Not on file   Other Topics Concern    Not on file   Social History Narrative    Not on file     Social Determinants of Health     Financial Resource Strain: Low Risk  (01/21/2022)    Overall Financial Resource Strain (CARDIA)     Difficulty of Paying Living Expenses: Not hard at all   Food Insecurity: No Food Insecurity (01/21/2022)    Hunger Vital Sign     Worried About Running Out of Food in the Last Year: Never true     Ran Out of Food in the Last Year: Never true   Transportation Needs: Unknown (01/21/2022)    PRAPARE - Therapist, art (Medical): Not on file     Lack of Transportation (Non-Medical): No    Physical Activity: Patient Declined (04/21/2021)  Exercise Vital Sign     Days of Exercise per Week: Patient declined     Minutes of Exercise per Session: Patient declined   Stress: Not on file   Social Connections: Not on file   Intimate Partner Violence: Not on file   Housing Stability: Unknown (01/21/2022)    Housing Stability Vital Sign     Unable to Pay for Housing in the Last Year: Not on file     Number of Places Lived in the Last Year: Not on file     Unstable Housing in the Last Year: No        Patient is a 57 year old female who presents the office today for follow-up of chronic conditions.  Patient has a chronic history of epigastric abdominal pain that has been ongoing for multiple months.  She had a colonoscopy as well as a recent EGD.  She has also had CT imaging.  No clear etiology has been noted.  Patient advised to follow back up with gastroenterology.  She also states she was evaluated at urgent care recently was noted to have mild anemia with a hemoglobin of 11.  She does have fatigue and decreased energy.  She has a history of hypertension that has been stable and well-controlled on her current regimen.  She also has a known history of hemorrhoids.  She states she does get discomfort from intermittent flares.  Requesting refill on her hydrocortisone to use as needed.  She has a history of allergic rhinitis and allergy type symptoms that are stable and well-controlled on cetirizine and Flonase.  Finally of note she does endorse symptoms of depression.  This has been noted over the past year.  She denies any SI/HI.  Reviewed recommendations for therapy and medication management.  She declines any medication management at this time.          ROS:   Review of Systems   Constitutional:  Positive for fatigue. Negative for chills and fever.   HENT:  Negative for rhinorrhea.    Respiratory:  Negative for cough and shortness of breath.    Cardiovascular:  Negative for chest pain and leg swelling.    Gastrointestinal:  Positive for abdominal pain (chronic intermittent, unchanged). Negative for blood in stool, constipation, diarrhea, nausea and vomiting.   Neurological:  Negative for dizziness, weakness, numbness and headaches.         Objective:     Vitals:    08/05/22 0927   BP: 134/84   Pulse: 78   Resp: 16   Temp: 97.4 F (36.3 C)   SpO2: 98%          Vitals and Nurse Documentation reviewed.     Physical Exam  Constitutional:       General: She is not in acute distress.     Appearance: Normal appearance. She is not ill-appearing.   HENT:      Head: Normocephalic and atraumatic.   Eyes:      Conjunctiva/sclera: Conjunctivae normal.   Cardiovascular:      Rate and Rhythm: Normal rate and regular rhythm.      Heart sounds: Normal heart sounds. No murmur heard.     No friction rub. No gallop.   Pulmonary:      Effort: Pulmonary effort is normal. No respiratory distress.      Breath sounds: Normal breath sounds. No wheezing, rhonchi or rales.   Abdominal:      General: Bowel sounds are normal. There is no distension.  Palpations: Abdomen is soft.      Tenderness: There is abdominal tenderness (Minimal tenderness to palpation in the epigastric region only, remainder of abdomen nontender to palpation). There is no guarding or rebound.   Musculoskeletal:      Cervical back: Neck supple.      Right lower leg: No edema.      Left lower leg: No edema.   Neurological:      Mental Status: She is alert and oriented to person, place, and time.      Gait: Gait normal.         No results found for this visit on 08/05/22.

## 2022-09-02 ENCOUNTER — Ambulatory Visit
Admit: 2022-09-02 | Payer: BLUE CROSS/BLUE SHIELD | Attending: Student in an Organized Health Care Education/Training Program | Primary: Student in an Organized Health Care Education/Training Program

## 2022-09-02 DIAGNOSIS — M79604 Pain in right leg: Secondary | ICD-10-CM

## 2022-09-02 MED ORDER — CETIRIZINE HCL 10 MG PO TABS
10 MG | ORAL_TABLET | Freq: Every day | ORAL | 1 refills | Status: AC | PRN
Start: 2022-09-02 — End: 2023-02-14

## 2022-09-02 MED ORDER — VITAMIN D3 1.25 MG (50000 UT) PO CAPS
1.2550000 MG (50000 UT) | ORAL_CAPSULE | ORAL | 0 refills | Status: AC
Start: 2022-09-02 — End: 2022-11-25

## 2022-09-02 MED ORDER — CYCLOBENZAPRINE HCL 5 MG PO TABS
5 | ORAL_TABLET | Freq: Three times a day (TID) | ORAL | 0 refills | Status: AC | PRN
Start: 2022-09-02 — End: ?

## 2022-09-02 NOTE — Progress Notes (Signed)
Assessment/Plan:     Diagnoses and all orders for this visit:    Right leg pain  -     XR FEMUR RIGHT (MIN 2 VIEWS); Future  -     cyclobenzaprine (FLEXERIL) 5 MG tablet; Take 1 tablet by mouth every 8 hours as needed for Muscle spasms  -Acute.  Primarily noted in right lateral thigh  -Patient did have a mechanical fall 1.5 weeks ago  -Given recent fall with localized pain will obtain an x-ray of the right upper leg  -Also patient endorses spasming type symptoms therefore will try Flexeril as needed  -Continue Tylenol as needed  -Return precautions discussed    Fall, initial encounter  -     XR FEMUR RIGHT (MIN 2 VIEWS); Future  -Patient had a recent mechanical fall 1.5 weeks ago  -See plan above    Vitamin D deficiency  -     Cholecalciferol (VITAMIN D3) 1.25 MG (50000 UT) CAPS; Take 1 capsule by mouth once a week  -Chronic.  Recent vitamin D level was low  -Therefore recommend taking high-dose vitamin D3 once weekly for 12 weeks.  -Recommend holding daily vitamin D and then resuming after completing high-dose vitamin D    Environmental and seasonal allergies  -     cetirizine (ZYRTEC) 10 MG tablet; Take 1 tablet by mouth daily as needed for Allergies  -Chronic stable.  -Continue cetirizine daily as needed and Flonase daily as needed       Return if symptoms worsen or fail to improve.     Discussed expected course/resolution/complications of diagnosis in detail with patient.    Medication risks/benefits/costs/interactions/alternatives discussed with patient.    Pt expressed understanding with the diagnosis and plan      Subjective:      Jacqueline Mckinney is a 57 y.o. female who presents for had concerns including Fall (2 weeks ago; leg pain).     Current Outpatient Medications   Medication Sig Dispense Refill    Cholecalciferol (VITAMIN D3) 1.25 MG (50000 UT) CAPS Take 1 capsule by mouth once a week 12 capsule 0    cetirizine (ZYRTEC) 10 MG tablet Take 1 tablet by mouth daily as needed for Allergies 90 tablet 1     cyclobenzaprine (FLEXERIL) 5 MG tablet Take 1 tablet by mouth every 8 hours as needed for Muscle spasms 30 tablet 0    NONFORMULARY Potassium      Multiple Vitamins-Minerals (THERAPEUTIC MULTIVITAMIN-MINERALS) tablet Take 1 tablet by mouth daily      fluticasone (FLONASE) 50 MCG/ACT nasal spray Use 2 spray(s) in each nostril once daily 1 each 2    hydroCHLOROthiazide (HYDRODIURIL) 25 MG tablet Take 1 tablet by mouth daily 90 tablet 1    levothyroxine (SYNTHROID) 125 MCG tablet Take 1 tablet by mouth every morning (before breakfast) 90 tablet 1    vitamin D3 (CHOLECALCIFEROL) 25 MCG (1000 UT) TABS tablet Take 1 tablet by mouth daily 90 tablet 1    hydrocortisone (PROCTOSOL HC) 2.5 % CREA rectal cream Apply BID as needed externally for up to 1 week for hemorrhoids 28 g 0     No current facility-administered medications for this visit.       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[facility]    TONSILLECTOMY AND ADENOIDECTOMY      UPPER GASTROINTESTINAL ENDOSCOPY N/A 06/01/2022    ESOPHAGOGASTRODUODENOSCOPY with biopsy performed by Marisa Hua, MD at [facility] ENDOSCOPY    UPPER GASTROINTESTINAL ENDOSCOPY  06/01/2022      Family History   Problem Relation Age of Onset    Diabetes Mother       Social History     Socioeconomic History    Marital status: Single     Spouse name: Not on file    Number of children: Not on file    Years of education: Not on file    Highest education level: Not on file   Occupational History    Not on file   Tobacco Use    Smoking status: Never     Smokeless tobacco: Never   Vaping Use    Vaping status: Never Used   Substance and Sexual Activity    Alcohol use: No    Drug use: No    Sexual activity: Not on file   Other Topics Concern    Not on file   Social History Narrative    Not on file     Social Determinants of Health     Financial Resource Strain: Low Risk  (01/21/2022)    Overall Financial Resource Strain (CARDIA)     Difficulty of Paying Living Expenses: Not hard at all   Food Insecurity: No Food Insecurity (01/21/2022)    Hunger Vital Sign     Worried About Running Out of Food in the Last Year: Never true     Ran Out of Food in the Last Year: Never true   Transportation Needs: Unknown (01/21/2022)    PRAPARE - Therapist, art (Medical): Not on file     Lack of Transportation (Non-Medical): No   Physical Activity: Patient Declined (04/21/2021)    Exercise Vital Sign     Days of Exercise per Week: Patient declined     Minutes of Exercise per Session: Patient declined   Stress: Not on file   Social Connections: Not on file   Intimate Partner Violence: Not on file   Housing Stability: Unknown (01/21/2022)    Housing Stability Vital Sign     Unable to Pay for Housing in the Last Year: Not on file     Number of Places Lived in the Last Year: Not on file     Unstable Housing in the Last Year: No        Patient states approximately 1.5 weeks ago she had a fall.  She states when she was walking to her car she had a mechanical fall where she fell onto her right side.  She states she landed on a small tree/bush on her side only. No syncope or LOC.  The primary impact and discomfort was on the right lateral thigh.  She denies any other known injury.  She denies any back pain, numbness, tingling or weakness in the extremities or bladder or bowel incontinence.  She states that her pain is in her right upper thigh  and will sometimes radiate down the entire leg.  It can feel like a spasming sensation.  She denies any other associated symptoms.   Patient also recently had lab work that did note a low vitamin D level.  She also has a history of seasonal allergies and is in need of refill for cetirizine that she uses for management of her symptoms.  She denies any other acute concerns.          ROS:   Review of Systems   Constitutional:  Negative for chills and fever.   HENT:  Negative for rhinorrhea.    Respiratory:  Negative for cough and shortness of breath.    Cardiovascular:  Negative for chest pain and leg swelling.   Gastrointestinal:  Negative for abdominal pain, nausea and vomiting.   Neurological:  Negative for dizziness, weakness, numbness and headaches.         Objective:     Vitals:    09/02/22 1009   BP: 124/79   Pulse: 80   Resp: 16   Temp: 97.2 F (36.2 C)   SpO2: 97%          Vitals and Nurse Documentation reviewed.     Physical Exam  Constitutional:       General: She is not in acute distress.     Appearance: Normal appearance. She is not ill-appearing.   HENT:      Head: Normocephalic and atraumatic.   Eyes:      Conjunctiva/sclera: Conjunctivae normal.   Cardiovascular:      Rate and Rhythm: Normal rate and regular rhythm.      Heart sounds: Normal heart sounds. No murmur heard.     No friction rub. No gallop.   Pulmonary:      Effort: Pulmonary effort is normal. No respiratory distress.      Breath sounds: Normal breath sounds. No wheezing, rhonchi or rales.   Abdominal:      General: Bowel sounds are normal. There is no distension.      Palpations: Abdomen is soft.      Tenderness: There is no abdominal tenderness. There is no guarding or rebound.   Musculoskeletal:      Cervical back: Neck supple.      Right lower leg: No edema.      Left lower leg: No edema.      Comments: Lumbar spine nontender to palpation, lumbar paraspinal muscles nontender to palpation bilaterally, bilateral hips nontender to palpation, normal strength and sensation in bilateral lower extremities, patellar flexes 2+ bilaterally, mild tenderness to palpation on the  mid right lateral thigh, no skin changes or bruising noted, normal range of motion in lower extremities, negative straight leg raise   Neurological:      Mental Status: She is alert and oriented to person, place, and time.      Gait: Gait normal.         No results found for this visit on 09/02/22.

## 2022-09-02 NOTE — Progress Notes (Signed)
"  Have you been to the ER, urgent care clinic since your last visit?  Hospitalized since your last visit?"    no    "Have you seen or consulted any other health care providers outside of [facility] since your last visit?"    no            Click Here for Release of Records Request

## 2022-09-07 ENCOUNTER — Other Ambulatory Visit: Payer: Self-pay

## 2022-09-07 ENCOUNTER — Emergency Department (HOSPITAL_COMMUNITY): Payer: BC Managed Care – PPO

## 2022-09-07 ENCOUNTER — Emergency Department (HOSPITAL_COMMUNITY)
Admission: EM | Admit: 2022-09-07 | Discharge: 2022-09-07 | Disposition: A | Discharge status: Home / Self Care | Payer: BC Managed Care – PPO | Attending: Emergency Medicine | Admitting: Emergency Medicine

## 2022-09-07 ENCOUNTER — Inpatient Hospital Stay
Admit: 2022-09-07 | Payer: BLUE CROSS/BLUE SHIELD | Primary: Student in an Organized Health Care Education/Training Program

## 2022-09-07 DIAGNOSIS — M79604 Pain in right leg: Secondary | ICD-10-CM

## 2022-09-07 DIAGNOSIS — R42 Dizziness and giddiness: Secondary | ICD-10-CM | POA: Diagnosis not present

## 2022-09-07 DIAGNOSIS — Z1152 Encounter for screening for COVID-19: Secondary | ICD-10-CM | POA: Insufficient documentation

## 2022-09-07 DIAGNOSIS — R519 Headache, unspecified: Secondary | ICD-10-CM | POA: Insufficient documentation

## 2022-09-07 DIAGNOSIS — I10 Essential (primary) hypertension: Secondary | ICD-10-CM | POA: Diagnosis not present

## 2022-09-07 DIAGNOSIS — E876 Hypokalemia: Secondary | ICD-10-CM | POA: Diagnosis not present

## 2022-09-07 DIAGNOSIS — Z7982 Long term (current) use of aspirin: Secondary | ICD-10-CM | POA: Diagnosis not present

## 2022-09-07 LAB — CBC
HCT: 40.9 % (ref 36.0–46.0)
Hemoglobin: 13 g/dL (ref 12.0–15.0)
MCH: 26.1 pg (ref 26.0–34.0)
MCHC: 31.8 g/dL (ref 30.0–36.0)
MCV: 82 fL (ref 80.0–100.0)
Platelets: 353 10*3/uL (ref 150–400)
RBC: 4.99 MIL/uL (ref 3.87–5.11)
RDW: 14.6 % (ref 11.5–15.5)
WBC: 7.4 10*3/uL (ref 4.0–10.5)
nRBC: 0 % (ref 0.0–0.2)

## 2022-09-07 LAB — COMPREHENSIVE METABOLIC PANEL
ALT: 40 U/L (ref 0–44)
AST: 33 U/L (ref 15–41)
Albumin: 3.7 g/dL (ref 3.5–5.0)
Alkaline Phosphatase: 79 U/L (ref 38–126)
Anion gap: 14 (ref 5–15)
BUN: 7 mg/dL (ref 6–20)
CO2: 27 mmol/L (ref 22–32)
Calcium: 9.5 mg/dL (ref 8.9–10.3)
Chloride: 99 mmol/L (ref 98–111)
Creatinine, Ser: 0.6 mg/dL (ref 0.44–1.00)
GFR, Estimated: >60 mL/min (ref >60)
Glucose, Bld: 121 mg/dL — ABNORMAL HIGH (ref 70–99)
Potassium: 3.1 mmol/L — ABNORMAL LOW (ref 3.5–5.1)
Sodium: 140 mmol/L (ref 135–145)
Total Bilirubin: 0.4 mg/dL (ref 0.3–1.2)
Total Protein: 7.4 g/dL (ref 6.5–8.1)

## 2022-09-07 LAB — D-DIMER, QUANTITATIVE: D-Dimer, Quant: <0.27 ug{FEU}/mL (ref 0.00–0.50)

## 2022-09-07 LAB — MAGNESIUM: Magnesium: 2 mg/dL (ref 1.7–2.4)

## 2022-09-07 LAB — SARS CORONAVIRUS 2 BY RT PCR: SARS Coronavirus 2 by RT PCR: NEGATIVE

## 2022-09-07 LAB — CBG MONITORING, ED: Glucose-Capillary: 101 mg/dL — ABNORMAL HIGH (ref 70–99)

## 2022-09-07 MED ORDER — KETOROLAC TROMETHAMINE 15 MG/ML IJ SOLN
15.0000 mg | Freq: Once | INTRAMUSCULAR | Status: AC
  Administered 2022-09-07: 15 mg via INTRAVENOUS
  Filled 2022-09-07: qty 1

## 2022-09-07 MED ORDER — SODIUM CHLORIDE 0.9 % IV BOLUS
1000.0000 mL | Freq: Once | INTRAVENOUS | Status: AC
  Administered 2022-09-07: 1000 mL via INTRAVENOUS

## 2022-09-07 MED ORDER — PROCHLORPERAZINE EDISYLATE 10 MG/2ML IJ SOLN
10.0000 mg | Freq: Once | INTRAMUSCULAR | Status: AC
  Administered 2022-09-07: 10 mg via INTRAVENOUS
  Filled 2022-09-07: qty 2

## 2022-09-07 MED ORDER — MECLIZINE HCL 25 MG PO TABS
25.0000 mg | ORAL_TABLET | Freq: Once | ORAL | Status: AC
  Administered 2022-09-07: 25 mg via ORAL
  Filled 2022-09-07: qty 1

## 2022-09-07 MED ORDER — DIPHENHYDRAMINE HCL 50 MG/ML IJ SOLN
25.0000 mg | Freq: Once | INTRAMUSCULAR | Status: AC
  Administered 2022-09-07: 25 mg via INTRAVENOUS
  Filled 2022-09-07: qty 1

## 2022-09-07 NOTE — ED Triage Notes (Signed)
Daughter in law/translator stated, She started having some dizziness with a headache and tingling on Monday. She went to work yesterday and her co workers stated she just was not herself. During her break she was just sitting cause she might have a migraine.

## 2022-09-07 NOTE — Discharge Instructions (Addendum)
You were seen in the ER today for a headache and dizziness. Your labwork was thankfully reassuring and your CT scan of your head was negative. You were given several medications for your headache and dizziness which appeared to help significantly. Please plan on following up with a primary care provider for further evaluation or return to the ER if symptoms are worsening.

## 2022-09-07 NOTE — ED Provider Notes (Signed)
[place] EMERGENCY DEPARTMENT AT [facility] Provider Note   CSN: [id number] Arrival date & time: 09/07/22  1037     History Chief Complaint  Patient presents with   Dizziness   Headache   Tingling   Shortness of Breath    Diana Small is a 57 y.o. female with past medical history significant for HTN and obesity who presents to the ED with concerns of dizziness and headache. Also endorses a tingling sensation to right side of head around the ear, but denies ear pain. Symptoms began about 2 days ago and had improvement last night with use of Advil but woke up again today with symptoms still ongoing. Denies any ringing in ear or hearing loss. No prior history of migraine headaches as far as patient can recall. She is currently on Ozempic for weight loss with most recent dose increase about 3 months ago. Some nausea and diarrhea that she has attributed to the Ozempic. Also endorses some SHOB worsened by exertion. No prior history of DVT or PE but no anticoagulated. Recently had long road trip to [place] in May 2024, but no other concerning possible contributors such as recent surgery, leg swelling, hemoptysis, or syncopal episodes.    Dizziness Associated symptoms: headaches and shortness of breath   Headache Associated symptoms: dizziness   Shortness of Breath Associated symptoms: headaches        Home Medications Prior to Admission medications   Medication Sig Start Date End Date Taking? Authorizing Provider  aspirin EC 81 MG tablet Take 81 mg by mouth daily.    [provider]  docusate sodium (COLACE) 100 MG capsule Take 1 capsule (100 mg total) by mouth 2 (two) times daily as needed for mild constipation. 06/10/15   Nonie Hoyer, PA-C  methocarbamol (ROBAXIN) 500 MG tablet Take 2 tablets (1,000 mg total) by mouth every 6 (six) hours as needed for muscle spasms. 06/10/15   Nonie Hoyer, PA-C  oxyCODONE (OXY IR/ROXICODONE) 5 MG immediate release tablet  Take 1-3 tablets (5-15 mg total) by mouth every 4 (four) hours as needed (5mg  for mild pain, 10mg  for moderate pain, 15mg  for severe pain). 06/10/15   Nonie Hoyer, PA-C  polyethylene glycol (MIRALAX / GLYCOLAX) packet Take 17 g by mouth 2 (two) times daily as needed. 06/10/15   Nonie Hoyer, PA-C  UNKNOWN TO PATIENT Take 1 tablet by mouth daily. Blood Pressure medication    [provider]      Allergies    Patient has no known allergies.    Review of Systems   Review of Systems  Respiratory:  Positive for shortness of breath.   Neurological:  Positive for dizziness and headaches.  All other systems reviewed and are negative.   Physical Exam Updated Vital Signs BP 105/60   Pulse 81   Temp 97.8 F (36.6 C) (Oral)   Resp 16   SpO2 97%  Physical Exam Vitals and nursing note reviewed.  Constitutional:      General: She is not in acute distress.    Appearance: She is well-developed.  HENT:     Head: Normocephalic and atraumatic.  Eyes:     General: No scleral icterus.    Extraocular Movements: Extraocular movements intact.     Conjunctiva/sclera: Conjunctivae normal.     Pupils: Pupils are equal, round, and reactive to light. Pupils are equal.     Comments: Slight nystagmus with EOM testing.  Cardiovascular:     Rate  and Rhythm: Normal rate and regular rhythm.     Heart sounds: Normal heart sounds. No murmur heard. Pulmonary:     Effort: Pulmonary effort is normal. No respiratory distress.     Breath sounds: Normal breath sounds. No wheezing or rales.  Abdominal:     General: Bowel sounds are normal. There is no distension.     Palpations: Abdomen is soft. There is no mass.     Tenderness: There is no abdominal tenderness.  Musculoskeletal:        General: No swelling.     Cervical back: Neck supple.  Skin:    General: Skin is warm and dry.     Capillary Refill: Capillary refill takes less than 2 seconds.  Neurological:     Mental Status: She is alert.      Cranial Nerves: No cranial nerve deficit or facial asymmetry.     Sensory: No sensory deficit.     Motor: No weakness.  Psychiatric:        Mood and Affect: Mood normal. Mood is not anxious.        Speech: Speech normal.     ED Results / Procedures / Treatments   Labs (all labs ordered are listed, but only abnormal results are displayed) Labs Reviewed  COMPREHENSIVE METABOLIC PANEL - Abnormal; Notable for the following components:      Result Value   Potassium 3.1 (*)    Glucose, Bld 121 (*)    All other components within normal limits  CBG MONITORING, ED - Abnormal; Notable for the following components:   Glucose-Capillary 101 (*)    All other components within normal limits  SARS CORONAVIRUS 2 BY RT PCR  CBC  MAGNESIUM  D-DIMER, QUANTITATIVE    EKG EKG Interpretation Date/Time:  Wednesday September 07 2022 10:43:15 EDT Ventricular Rate:  86 PR Interval:  158 QRS Duration:  80 QT Interval:  354 QTC Calculation: 423 R Axis:   83  Text Interpretation: Normal sinus rhythm Normal ECG When compared with ECG of 08-Jun-2015 16:47, PREVIOUS ECG IS PRESENT No significant change since last tracing Confirmed by Jacalyn Lefevre [phone]) on 09/07/2022 2:02:59 PM  Radiology CT Head Wo Contrast  Result Date: 09/07/2022 CLINICAL DATA:  Headache EXAM: CT HEAD WITHOUT CONTRAST TECHNIQUE: Contiguous axial images were obtained from the base of the skull through the vertex without intravenous contrast. RADIATION DOSE REDUCTION: This exam was performed according to the departmental dose-optimization program which includes automated exposure control, adjustment of the mA and/or kV according to patient size and/or use of iterative reconstruction technique. COMPARISON:  CT head 06/08/2015 FINDINGS: Brain: There is no acute intracranial hemorrhage, extra-axial fluid collection, or acute infarct. Parenchymal volume is normal. The ventricles are normal in size. Gray-white differentiation is preserved. The  pituitary and suprasellar region are normal. There is no mass lesion. There is no mass effect or midline shift. Vascular: No hyperdense vessel or unexpected calcification. Skull: Normal. Negative for fracture or focal lesion. Sinuses/Orbits: The paranasal sinuses are clear. The globes and orbits are unremarkable. Other: The mastoid air cells and middle ear cavities are clear. IMPRESSION: No acute intracranial pathology. Electronically Signed   By: Lesia Hausen M.D.   On: 09/07/2022 14:39   DG Chest 2 View  Result Date: 09/07/2022 CLINICAL DATA:  Shortness of breath. EXAM: CHEST - 2 VIEW COMPARISON:  06/09/2015 FINDINGS: Stable cardiac enlargement. Low lung volumes with slight asymmetric elevation of the right hemidiaphragm. No pleural fluid, interstitial edema or airspace disease. Visualized  osseous structures appear unremarkable. Spondylosis identified within the thoracic spine. IMPRESSION: 1. Cardiac enlargement.  No signs of CHF. 2. Low lung volumes. Electronically Signed   By: Signa Kell M.D.   On: 09/07/2022 11:41    Procedures Procedures   Medications Ordered in ED Medications  sodium chloride 0.9 % bolus 1,000 mL (0 mLs Intravenous Stopped 09/07/22 1728)  prochlorperazine (COMPAZINE) injection 10 mg (10 mg Intravenous Given 09/07/22 1629)  diphenhydrAMINE (BENADRYL) injection 25 mg (25 mg Intravenous Given 09/07/22 1629)  ketorolac (TORADOL) 15 MG/ML injection 15 mg (15 mg Intravenous Given 09/07/22 1629)  meclizine (ANTIVERT) tablet 25 mg (25 mg Oral Given 09/07/22 1630)    ED Course/ Medical Decision Making/ A&P                               Medical Decision Making Amount and/or Complexity of Data Reviewed Labs: ordered. Radiology: ordered.  Risk Prescription drug management.   This patient presents to the ED for concern of headache, dizziness. Differential diagnosis includes stroke, SAH, migraine, sinusitis, dehydration    Lab Tests:  I Ordered, and personally interpreted  labs.  The pertinent results include: CBC unremarkable, CMP with mild hypokalemia 3.1, D-dimer negative, COVID-19 negative, magnesium normal, CBG at 1 1   Imaging Studies ordered:  I ordered imaging studies including chest x-ray, CT head I independently visualized and interpreted imaging which showed some cardiac enlargement but no evidence of CHF, CT head unremarkable I agree with the radiologist interpretation   Medicines ordered and prescription drug management:  I ordered medication including fluids, Compazine, Toradol, Benadryl, meclizine for migraine cocktail  Reevaluation of the patient after these medicines showed that the patient improved I have reviewed the patients home medicines and have made adjustments as needed   Problem List / ED Course:  Patient presented to the ED with concerns of dizziness, shortness of breath, and right sided headache. No unilateral arm or leg weakness or numbness. No facial droop or slurring of speech. Symptoms began approximately 2 days ago with intermediate improvement with Advil use. Code stroke not initiated. No history of headaches or migraines. Workup initiated for complex presentation including CBC, CMP, magnesium, COVID-19, CBG, and d-dimer. Lab workup largely unremarkable with CBC all within normal limits and CMP only mild hypokalemia noted at 3.1.  D-dimer is negative, magnesium at 2.0, COVID-19 negative. Informed patient of reassuring labs and imaging with a negative head CT. Will proceed with migraine cocktail. Low concern at this time for more acute cause of dizziness as it appears to be entirely positional in nature. Headaches also appear to respond well to NSAIDs at home. Not presently endorsing any shortness of breath or chest pain so doubt ACS. Reassessed patient about 1 hour post-migraine cocktail administration and she reports that symptoms have almost entirely resolved. No longer feeling and head pain, dizziness, or nausea. Appears  stable for discharge home at this time. Advised need for close outpatient follow up with PCP. Also discussed strict return precautions. All questions answered prior to patient discharge.   Final Clinical Impression(s) / ED Diagnoses Final diagnoses:  Bad headache  Vertigo    Rx / DC Orders ED Discharge Orders     None         Smitty Knudsen, PA-C 09/07/22 1844    Jacalyn Lefevre, MD 09/13/22 1525

## 2022-09-07 NOTE — Telephone Encounter (Signed)
 Pt stated that she needs a referral to chek on her leg. Need to be faxed to provider.

## 2022-09-20 NOTE — Telephone Encounter (Signed)
 Pt stated that she went to have Xray of her leg and she was told the results will be sent to PCP. She would like a nurse or pcp to call with results

## 2022-09-20 NOTE — Telephone Encounter (Signed)
 Patient came to office with continuous leg pain requesting the xray results.  She stated she may have to go to ED.  P [phone]

## 2022-09-21 ENCOUNTER — Ambulatory Visit
Admit: 2022-09-21 | Discharge: 2022-09-21 | Payer: BLUE CROSS/BLUE SHIELD | Attending: Family Medicine | Primary: Student in an Organized Health Care Education/Training Program

## 2022-09-21 ENCOUNTER — Inpatient Hospital Stay
Admit: 2022-09-21 | Payer: BLUE CROSS/BLUE SHIELD | Primary: Student in an Organized Health Care Education/Training Program

## 2022-09-21 VITALS — BP 131/84 | HR 85 | Temp 97.40000°F | Resp 17 | Ht 67.0 in | Wt 170.2 lb

## 2022-09-21 DIAGNOSIS — M5442 Lumbago with sciatica, left side: Secondary | ICD-10-CM

## 2022-09-21 NOTE — Progress Notes (Signed)
 Jacqueline Mckinney  57 y.o. female  December 29, 1965  FMW:[id number]  [facility]  Progress Note     Encounter Date: 09/21/2022    Assessment and Plan:       ICD-10-CM    1. Chronic bilateral low back pain with bilateral sciatica  M54.42 XR LUMBAR SPINE (2-3 VIEWS)    M54.41     G89.29       2. Pain in both lower extremities  M79.604 ANA By Multiplex Flow IA, QL    M79.605 Sedimentation Rate     C-Reactive Protein     RheumAssure     CK     Aldolase     Aldolase     CK     RheumAssure     C-Reactive Protein     Sedimentation Rate     ANA By Multiplex Flow IA, QL        1. Chronic bilateral low back pain with bilateral sciatica  Normal exam of both lower extremities  This is probably pain from lumbar origin radiating down legs  Check xrays  Check labs for other inflammatory causes of leg pain but think those are unlikely with today's normal exam.  -     XR LUMBAR SPINE (2-3 VIEWS); Future  2. Pain in both lower extremities  -     ANA By Multiplex Flow IA, QL; Future  -     Sedimentation Rate; Future  -     C-Reactive Protein; Future  -     RheumAssure; Future  -     CK; Future  -     Aldolase; Future       I have discussed the diagnosis with the patient and the intended plan as seen in the above orders.  she has expressed understanding.  The patient has received an after-visit summary and questions were answered concerning future plans.  I have discussed medication side effects and warnings with the patient as well.    Electronically Signed: Charlie VEAR Eastern, MD    Current Medications after this visit     Current Outpatient Medications   Medication Sig    Cholecalciferol (VITAMIN D3) 1.25 MG (50000 UT) CAPS Take 1 capsule by mouth once a week    cetirizine  (ZYRTEC ) 10 MG tablet Take 1 tablet by mouth daily as needed for Allergies    cyclobenzaprine  (FLEXERIL ) 5 MG tablet Take 1 tablet by mouth every 8 hours as needed for Muscle spasms    NONFORMULARY Potassium    Multiple Vitamins-Minerals  (THERAPEUTIC MULTIVITAMIN-MINERALS) tablet Take 1 tablet by mouth daily    fluticasone  (FLONASE ) 50 MCG/ACT nasal spray Use 2 spray(s) in each nostril once daily    hydroCHLOROthiazide  (HYDRODIURIL ) 25 MG tablet Take 1 tablet by mouth daily    levothyroxine  (SYNTHROID ) 125 MCG tablet Take 1 tablet by mouth every morning (before breakfast)    vitamin D3 (CHOLECALCIFEROL) 25 MCG (1000 UT) TABS tablet Take 1 tablet by mouth daily    hydrocortisone  (PROCTOSOL HC) 2.5 % CREA rectal cream Apply BID as needed externally for up to 1 week for hemorrhoids     No current facility-administered medications for this visit.     There are no discontinued medications.  ~~~~~~~~~~~~~~~~~~~~~~~~~~~~~~~~~~~~~~~~~~~~~~~~~~~~~~~~~~~    Chief Complaint   Patient presents with    Leg Pain       History provided by patient  History of Present Illness   Jacqueline Mckinney is a 57 y.o. female who presents to  clinic today for:  Leg Pain    Bilateral leg pain for 6 months  Both quads, both knees, both calves  Sometimes nearly falls  Had xrays of femur, right, that were normal  No injuries.  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    Recent labs  Normal CBC, CMP, A1c, TSH  Very low Vitamin D   Taking high dose Vitamin D .    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    Health Maintenance  Will do at future visit  Health Maintenance Due   Topic Date Due    HIV screen  Never done    Hepatitis B vaccine (1 of 3 - 19+ 3-dose series) Never done    DTaP/Tdap/Td vaccine (1 - Tdap) Never done    Shingles vaccine (2 of 3) 01/02/2022    Flu vaccine (1) 08/11/2022    COVID-19 Vaccine (3 - 2023-24 season) 09/11/2022     Review of Systems   Review of Systems   Respiratory:  Negative for shortness of breath.    Cardiovascular:  Negative for chest pain.   Gastrointestinal: Negative.    Genitourinary: Negative.    Musculoskeletal:  Positive for arthralgias, back  pain and myalgias.   Psychiatric/Behavioral:  Positive for dysphoric mood. The patient is nervous/anxious.           Vitals/Objective:     Vitals:    09/21/22 0851   BP: 131/84   Site: Left Upper Arm   Position: Sitting   Cuff Size: Medium Adult   Pulse: 85   Resp: 17   Temp: 97.4 F (36.3 C)   TempSrc: Tympanic   Weight: 77.2 kg (170 lb 3.2 oz)   Height: 1.702 m (5' 7)     Body mass index is 26.66 kg/m.    Wt Readings from Last 3 Encounters:   09/21/22 77.2 kg (170 lb 3.2 oz)   09/02/22 73.9 kg (163 lb)   08/05/22 74.7 kg (164 lb 9.6 oz)         Objective  Physical Exam   General: Patient alert and oriented and in NAD  BACK EXAM:   Inspection: normal skin, soft tissue and bony appearance with normal cervical and lumbar lordotic curve, no gross edema or evidence of acute injury;   Palpation: normal palpation of the bony posterior spinous processes, the sacral spine, iliac crest, posterior iliac spine and coccyx  without soft tissue abnormality;   Neurovascular: deep tendon reflexes: 2/4 left patellar,  2/4 right patellar,  2/4 left Achilles,  2/4 right Achilles, Symmetric Reflexes without clonus;   Muscular Strength: 5/5 bilateral quadriceps;  5/5 bilateral tibialis anterior;  5/5 bilateral extensor hallucis longus;  5/5 bilateral extensor digitorum longus and brevis;  5/5 bilateral peroneus longus and brevis;  5/5 bilateral gastrocnemius-soleus;   Range of Motion: full active ROM with extension, flexion, left lateral bending, right lateral bending, left rotation, and right rotation;   Maneuvers:   (-) bilateral straight leg raise     Full Range of motion of both hips without pain or limitation   FROM both knees without pain or limitation  Normal appearing skin both legs.  Psych: Appropriate mood and affect, no homicidal or suicidal ideation, no obsessions, delusions or hallucinations, normal psychomotor status.     No results found for this or any previous visit (from the past 24 hour(s)).  Disposition   Return  in about 3 weeks (around 10/12/2022) for Test results.   History   Patient's past medical, surgical and family histories were reviewed and updated.    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   Social History       Tobacco History       Smoking Status  Never      Smokeless Tobacco Use  Never              Alcohol History       Alcohol Use Status  No              Drug Use       Drug Use Status  No              Sexual Activity       Sexually Active  Not Asked                     Allergies     Allergies   Allergen Reactions    Bee Pollen Other (See Comments)

## 2022-09-21 NOTE — Progress Notes (Signed)
 Identified patient with two patient identifiers (name and DOB). Reviewed chart in preparation for visit and have obtained necessary documentation.    Jacqueline Mckinney is a 57 y.o. female  Chief Complaint   Patient presents with    Leg Pain     BP 131/84 (Site: Left Upper Arm, Position: Sitting, Cuff Size: Medium Adult)   Pulse 85   Temp 97.4 F (36.3 C) (Tympanic)   Resp 17   Ht 1.702 m (5' 7)   Wt 77.2 kg (170 lb 3.2 oz)   BMI 26.66 kg/m     1. Have you been to the ER, urgent care clinic since your last visit?  Hospitalized since your last visit?no    2. Have you seen or consulted any other health care providers outside of the [facility] since your last visit?  Include any pap smears or colon screening. no

## 2022-09-22 LAB — C-REACTIVE PROTEIN: CRP: <0.29 mg/dL (ref 0.00–0.30)

## 2022-09-22 LAB — CK: Total CK: 281 U/L — ABNORMAL HIGH (ref 26–192)

## 2022-09-22 LAB — SEDIMENTATION RATE: Sed Rate, Automated: 17 mm/h (ref 0–30)

## 2022-09-23 LAB — ALDOLASE: Aldolase: 8.1 U/L (ref 3.3–10.3)

## 2022-09-23 LAB — ANA BY MULTIPLEX FLOW IA, QL: ANA: NEGATIVE

## 2022-10-03 LAB — RHEUMASSURE
14.3.3 ETA,Rheum. Arthritis: <0.2 ng/mL
CCP Antibodies IgG/IgA: <20 U
Rheumatoid Factor: 14.3 U/mL — ABNORMAL HIGH

## 2022-10-06 ENCOUNTER — Ambulatory Visit
Admit: 2022-10-06 | Discharge: 2022-10-10 | Payer: BLUE CROSS/BLUE SHIELD | Attending: Student in an Organized Health Care Education/Training Program | Primary: Student in an Organized Health Care Education/Training Program

## 2022-10-06 VITALS — BP 135/87 | HR 88 | Temp 97.80000°F | Ht 67.0 in | Wt 164.0 lb

## 2022-10-06 DIAGNOSIS — Z09 Encounter for follow-up examination after completed treatment for conditions other than malignant neoplasm: Secondary | ICD-10-CM

## 2022-10-06 NOTE — Progress Notes (Signed)
 Identified pt with two pt identifiers(name and DOB). Reviewed record in preparation for visit and have obtained necessary documentation. All patient medications has been reviewed.  Chief Complaint   Patient presents with    Back Pain    Follow-Up from Hospital     Pt was admitted for 2 days at [facility]. Pt states she had a MRI done and it showed abnormal findings.       Vitals:    10/06/22 0854   BP: (!) 147/84   Pulse: 88   Temp: 97.8 F (36.6 C)   SpO2: 97%                   Coordination of Care Questionnaire:   1) Have you been to an emergency room, urgent care, or hospitalized since your last visit?   Yes seen at [facility] on 10/03/22.      2. Have seen or consulted any other health care provider since your last visit? no

## 2022-10-06 NOTE — Progress Notes (Signed)
 Assessment/Plan:     Diagnoses and all orders for this visit:    Hospital discharge follow-up  -Patient recently hospitalized from 9/21 through 9/23 for back pain  -She was evaluated by orthopedics and had an MRI of her lumbar and thoracic spine  -Medications reviewed and reconciled  -Recommend follow-up with orthopedics    Chronic bilateral low back pain with bilateral sciatica  -     [facility]FL - Whitman Chew, MD, Orthopedic Surgery (back, neck, spine),  ([address])  -Chronic.  Noted for years.  Has been worsening over time  -She states her leg pain has resolved.  No red flag signs or symptoms.  -MRI of the lumbar spine did show lower lumbar spine degenerative changes with potential impingement at L4 and L5  -She remains on a muscle relaxer as well as a Medrol Dosepak  -Reviewed precautions with muscle relaxer including fatigue and fall risk  -Patient declines physical therapy  -Given slow worsening over time recommend evaluation by orthopedics.  Referral placed today       Please note that this dictation was completed with Dragon, the computer voice recognition software. Quite often unanticipated grammatical, syntax, homophones, and other interpretive errors are inadvertently transcribed by the computer software. Please disregard these errors. Please excuse any errors that have escaped final proofreading.      Return in about 1 month (around 11/05/2022), or if symptoms worsen or fail to improve, for chronic conditions .     Discussed expected course/resolution/complications of diagnosis in detail with patient.    Medication risks/benefits/costs/interactions/alternatives discussed with patient.    Pt expressed understanding with the diagnosis and plan      Subjective:      Jacqueline Mckinney is a 57 y.o. female who presents for had concerns including Back Pain and Follow-Up from Hospital (Pt was admitted for 2 days at [facility]. Pt states she had a MRI done and it showed abnormal findings.).     Current  Outpatient Medications   Medication Sig Dispense Refill    ROBAXIN-750 750 MG tablet Take 1 tablet by mouth 4 times daily as needed      methylPREDNISolone (MEDROL DOSEPACK) 4 MG tablet TAKE BY MOUTH AS DIRECTED ON INSIDE OF PACKAGE      Cholecalciferol (VITAMIN D3) 1.25 MG (50000 UT) CAPS Take 1 capsule by mouth once a week 12 capsule 0    cetirizine  (ZYRTEC ) 10 MG tablet Take 1 tablet by mouth daily as needed for Allergies 90 tablet 1    NONFORMULARY Potassium      Multiple Vitamins-Minerals (THERAPEUTIC MULTIVITAMIN-MINERALS) tablet Take 1 tablet by mouth daily      fluticasone  (FLONASE ) 50 MCG/ACT nasal spray Use 2 spray(s) in each nostril once daily 1 each 2    hydroCHLOROthiazide  (HYDRODIURIL ) 25 MG tablet Take 1 tablet by mouth daily 90 tablet 1    levothyroxine  (SYNTHROID ) 125 MCG tablet Take 1 tablet by mouth every morning (before breakfast) 90 tablet 1    vitamin D3 (CHOLECALCIFEROL) 25 MCG (1000 UT) TABS tablet Take 1 tablet by mouth daily 90 tablet 1    hydrocortisone  (PROCTOSOL HC) 2.5 % CREA rectal cream Apply BID as needed externally for up to 1 week for hemorrhoids 28 g 0     No current facility-administered medications for this visit.       Allergies   Allergen Reactions    Bee Pollen Other (See Comments)     Past Medical History:   Diagnosis Date    Anemia  07/20/2022    H/O seasonal allergies     Hyperlipidemia     Hypertension     Hypothyroid     Ill-defined condition 2005    ependyoma resection  2005, s/p radiation at [facility]    Impaired glucose tolerance       Past Surgical History:   Procedure Laterality Date    EGD DELIVER THERMAL ENERGY SPHNCTR/CARDIA GERD      OTHER SURGICAL HISTORY      ependyoma resection  2005, s/p radiation at [facility]    TONSILLECTOMY AND ADENOIDECTOMY      UPPER GASTROINTESTINAL ENDOSCOPY N/A 06/01/2022    ESOPHAGOGASTRODUODENOSCOPY with biopsy performed by Belynda Mayo, MD at [facility] ENDOSCOPY    UPPER GASTROINTESTINAL ENDOSCOPY  06/01/2022      Family History   Problem Relation  Age of Onset    Diabetes Mother       Social History     Socioeconomic History    Marital status: Single     Spouse name: Not on file    Number of children: Not on file    Years of education: Not on file    Highest education level: Not on file   Occupational History    Not on file   Tobacco Use    Smoking status: Never    Smokeless tobacco: Never   Vaping Use    Vaping status: Never Used   Substance and Sexual Activity    Alcohol use: No    Drug use: No    Sexual activity: Not on file   Other Topics Concern    Not on file   Social History Narrative    Not on file     Social Determinants of Health     Financial Resource Strain: Low Risk  (01/21/2022)    Overall Financial Resource Strain (CARDIA)     Difficulty of Paying Living Expenses: Not hard at all   Food Insecurity: No Food Insecurity (01/21/2022)    Hunger Vital Sign     Worried About Running Out of Food in the Last Year: Never true     Ran Out of Food in the Last Year: Never true   Transportation Needs: Unknown (01/21/2022)    PRAPARE - Therapist, Art (Medical): Not on file     Lack of Transportation (Non-Medical): No   Physical Activity: Patient Declined (04/21/2021)    Exercise Vital Sign     Days of Exercise per Week: Patient declined     Minutes of Exercise per Session: Patient declined   Stress: Not on file   Social Connections: Not on file   Intimate Partner Violence: Not on file   Housing Stability: Unknown (01/21/2022)    Housing Stability Vital Sign     Unable to Pay for Housing in the Last Year: Not on file     Number of Places Lived in the Last Year: Not on file     Unstable Housing in the Last Year: No        Patient is a 57 year old female who presents to the office today for hospital discharge follow-up.  Patient was recently hospitalized from 9/21 through 9/23 due to back pain and atypical chest pain.  Her troponin levels were normal and her chest pain was believed to be musculoskeletal.  During her hospitalization she did  have an MRI of her thoracic and lumbar spine.  She was evaluated by orthopedics and cleared.  She was discharged home with  a Medrol Dosepak and a muscle relaxer.  Patient also had been recommended to start PT but declines , states that she has tried this in the past.  She states that while on the medication her leg pain has resolved but she does continue to have some back pain noted in the lower thoracic/upper lumbar region.  She denies any numbness, tingling, weakness or bladder or bowel incontinence.  She does feel the pain makes it difficult to work.  She is still completing her Medrol Dosepak.  She is agreeable to establish with orthopedics.  She denies any other acute concerns.          ROS:   Review of Systems   Constitutional:  Negative for chills and fever.   HENT:  Negative for rhinorrhea.    Respiratory:  Negative for cough and shortness of breath.    Cardiovascular:  Negative for chest pain and leg swelling.   Gastrointestinal:  Negative for abdominal pain, nausea and vomiting.   Musculoskeletal:  Positive for back pain.   Neurological:  Negative for dizziness, weakness, numbness and headaches.         Objective:     Vitals:    10/06/22 0931   BP: 135/87   Pulse:    Temp:    SpO2:       BP 135/87 (Site: Left Upper Arm, Position: Sitting, Cuff Size: Medium Adult)   Pulse 88   Temp 97.8 F (36.6 C) (Temporal)   Ht 1.702 m (5' 7)   Wt 74.4 kg (164 lb)   SpO2 97%   BMI 25.69 kg/m       Vitals and Nurse Documentation reviewed.     Physical Exam  Constitutional:       General: She is not in acute distress.     Appearance: Normal appearance. She is not ill-appearing.   HENT:      Head: Normocephalic and atraumatic.   Eyes:      Conjunctiva/sclera: Conjunctivae normal.   Cardiovascular:      Rate and Rhythm: Normal rate and regular rhythm.      Heart sounds: Normal heart sounds. No murmur heard.     No friction rub. No gallop.   Pulmonary:      Effort: Pulmonary effort is normal. No respiratory distress.       Breath sounds: Normal breath sounds. No wheezing, rhonchi or rales.   Abdominal:      General: Bowel sounds are normal. There is no distension.      Palpations: Abdomen is soft.      Tenderness: There is no abdominal tenderness. There is no guarding or rebound.   Musculoskeletal:      Cervical back: Neck supple.      Right lower leg: No edema.      Left lower leg: No edema.   Neurological:      Mental Status: She is alert and oriented to person, place, and time.      Gait: Gait normal.       BACK EXAM:   Inspection: normal cervical and lumbar lordotic curve  Palpation: No tenderness to palpation of the lumbar spine; mild tenderness to palpation noted in the lower thoracic/upper lumbar paraspinal muscles bilaterally  Neurovascular: deep tendon reflexes: 2+ left patellar,  2+ right patellar, Symmetric Reflexes without clonus, normal sensation in b/l LE   Muscular Strength: 5/5 strength in bilateral lower extremities muscles   Range of Motion: Full ROM.      No results  found for this visit on 10/06/22.

## 2022-10-17 ENCOUNTER — Ambulatory Visit
Admit: 2022-10-17 | Payer: BLUE CROSS/BLUE SHIELD | Attending: Family | Primary: Student in an Organized Health Care Education/Training Program

## 2022-10-17 DIAGNOSIS — M5441 Lumbago with sciatica, right side: Secondary | ICD-10-CM

## 2022-10-17 MED ORDER — GABAPENTIN 100 MG PO CAPS
100 | ORAL_CAPSULE | Freq: Three times a day (TID) | ORAL | 0 refills | Status: DC
Start: 2022-10-17 — End: 2023-10-03

## 2022-10-17 NOTE — Progress Notes (Signed)
History of present illness:Jacqueline Mckinney is a 57 y.o. female presenting for falling, weakness, and back pain.      Jacqueline Mckinney fell 2 months ago which caused a flare up of back pain. She has had 3 additional falls since. 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 She was seen 10/06/22 and prescribed a Medrol dose pack and Robaxin. She denies any improvement with the medication. This morning she was unable to go to work due to the pain. She has previously completed physical therapy.  She started using a cane yesterday. She has an appointment with [facility] 10/28/22, but says she cannot tolerate the pain until then. She wants to go on disability since she can no longer go to work due to pain.     She saw an Ophthalmologist on Friday for double vision. She said she was given an eye drop during her visit and an order for labwork. She had her blood drawn this morning.      Review of Systems   Constitutional:  Negative for fever.   Musculoskeletal:  Positive for back pain.   Psychiatric/Behavioral:  Positive for dysphoric mood.            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 Authorizing Provider    methocarbamol (ROBAXIN) 750 MG tablet Take 1 tablet by mouth 4 times daily   Yes [provider]   gabapentin (NEURONTIN) 100 MG capsule Take 1 capsule by mouth 3 times daily for 30 days. Intended supply: 90 days Max Daily Amount: 300 mg 10/17/22 11/16/22 Yes Pixie Casino, APRN - CNP   methylPREDNISolone (MEDROL DOSEPACK) 4 MG tablet TAKE BY MOUTH AS DIRECTED ON INSIDE OF PACKAGE 10/03/22  Yes [provider]   Cholecalciferol (VITAMIN D3) 1.25 MG (50000 UT) CAPS Take 1 capsule by mouth once a week 09/02/22 11/25/22 Yes Hearst, Eliberto Ivory, MD   cetirizine (ZYRTEC) 10 MG tablet Take 1 tablet by mouth daily as needed for Allergies 09/02/22  Yes Hearst, Eliberto Ivory, MD   NONFORMULARY Potassium   Yes [provider]   Multiple Vitamins-Minerals (THERAPEUTIC MULTIVITAMIN-MINERALS) tablet Take 1 tablet by mouth daily   Yes [provider]   fluticasone (FLONASE) 50 MCG/ACT nasal spray Use 2 spray(s) in each nostril once daily 08/05/22  Yes Hearst, Eliberto Ivory, MD   hydroCHLOROthiazide (HYDRODIURIL) 25 MG tablet Take 1 tablet by mouth daily 08/05/22  Yes Hearst, Eliberto Ivory, MD  levothyroxine (SYNTHROID) 125 MCG tablet Take 1 tablet by mouth every morning (before breakfast) 08/05/22  Yes Hearst, Eliberto Ivory, MD   vitamin D3 (CHOLECALCIFEROL) 25 MCG (1000 UT) TABS tablet Take 1 tablet by mouth daily 08/05/22  Yes Hearst, Eliberto Ivory, MD   hydrocortisone (PROCTOSOL HC) 2.5 % CREA rectal cream Apply BID as needed externally for up to 1 week for hemorrhoids 08/05/22  Yes Vernell Barrier Eliberto Ivory, MD   ROBAXIN-750 750 MG tablet Take 1 tablet by mouth 4 times daily as needed  Patient not taking: Reported on 10/17/2022 10/03/22   [provider]        Allergies   Allergen Reactions    Bee Pollen Other (See Comments)       Vitals:    10/17/22 1056   BP: (!) 144/90   Site: Left Upper Arm   Position: Sitting   Cuff Size: Small Adult   Pulse: 78   Resp: 16   Temp: 97.6 F (36.4 C)    TempSrc: Temporal   SpO2: 99%   Weight: 73.9 kg (163 lb)   Height: 1.702 m (5\' 7" )     Body mass index is 25.53 kg/m.      Objective  Physical Exam  Vitals and nursing note reviewed.   Pulmonary:      Effort: Pulmonary effort is normal.   Musculoskeletal:      Lumbar back: No tenderness. Decreased range of motion (pain located right paramidline, L4/5 with radiating pain down the lateral thigh).   Neurological:      Motor: No weakness.      Gait: Gait abnormal (Antalgic).      Deep Tendon Reflexes:      Reflex Scores:       Patellar reflexes are 2+ on the right side and 2+ on the left side.  Psychiatric:         Mood and Affect: Affect is labile and tearful.         Behavior: Behavior is agitated.         Assessment and Plan    1. Chronic right-sided low back pain with right-sided sciatica  Assessment & Plan:  Obtained appointment for patient today 10/17/2022 with [facility] [place].  Rx gabapentin 100 mg 3 times daily as needed  Continue using cane  Follow-up as needed  Orders:  -     gabapentin (NEURONTIN) 100 MG capsule; Take 1 capsule by mouth 3 times daily for 30 days. Intended supply: 90 days Max Daily Amount: 300 mg, Disp-90 capsule, R-0Normal       Return if symptoms worsen or fail to improve.     Diagnosis and plan discussed with patient who verbillized understanding.

## 2022-10-17 NOTE — Progress Notes (Signed)
"  Have you been to the ER, urgent care clinic since your last visit?  Hospitalized since your last visit?"    no    "Have you seen or consulted any other health care providers outside our system since your last visit?"    no

## 2022-10-17 NOTE — Progress Notes (Signed)
Patient has a appt to see PA Prudencio Burly  today @ 2:45 at the [facility] location- [facility] [place]     Patient verbally agreed to this appt     Greer Ee, LPN

## 2022-10-17 NOTE — Assessment & Plan Note (Addendum)
Obtained appointment for patient today 10/17/2022 with [facility] [place].  Rx gabapentin 100 mg 3 times daily as needed  Continue using cane  Follow-up as needed

## 2022-10-19 NOTE — Telephone Encounter (Signed)
Pt stated that her appt is 11/04/2022 but her excuse is up to 10/28/2022. Can we up date her work excuse.

## 2022-10-26 NOTE — Telephone Encounter (Addendum)
Patient states that she needs a work note that say she is unable to work  Can we up date her work excuse.     Please fax to Othelia Pulling [phone]

## 2022-10-27 NOTE — Telephone Encounter (Signed)
LM- to call back     Per NP Pixie Casino patient will need to reach out to ortho to get a extended work excuse.

## 2022-11-02 ENCOUNTER — Ambulatory Visit
Admit: 2022-11-02 | Payer: BLUE CROSS/BLUE SHIELD | Attending: Family Medicine | Primary: Student in an Organized Health Care Education/Training Program

## 2022-11-02 DIAGNOSIS — M5441 Lumbago with sciatica, right side: Secondary | ICD-10-CM

## 2022-11-02 NOTE — Progress Notes (Signed)
SAPHIA LAFORTE  57 y.o. female  08/03/1965  ZOX:[id number]  [facility]  Progress Note     Encounter Date: 11/02/2022    Assessment and Plan:       ICD-10-CM    1. Chronic bilateral low back pain with bilateral sciatica  M54.42     M54.41     G89.29       2. Neck pain  M54.2       3. Diplopia  H53.2         1. Chronic bilateral low back pain with bilateral sciatica  For lumbar spine injection later this week.  2. Neck pain  Brisk upper ext reflexes, subjective weakness  To ask ortho if she also needs MRI of neck  3. Diplopia  Myasthenia labs ordered.  On Mestinon now.    Three separate disability and FMLA forms completed for patient.    I have discussed the diagnosis with the patient and the intended plan as seen in the above orders.  she has expressed understanding.  The patient has received an after-visit summary and questions were answered concerning future plans.  I have discussed medication side effects and warnings with the patient as well.    Electronically Signed: Amelia Jo, MD    Current Medications after this visit     Current Outpatient Medications   Medication Sig    pyRIDostigmine (MESTINON) 60 MG tablet Take 1 tablet by mouth 3 times daily    gabapentin (NEURONTIN) 100 MG capsule Take 1 capsule by mouth 3 times daily for 30 days. Intended supply: 90 days Max Daily Amount: 300 mg    Cholecalciferol (VITAMIN D3) 1.25 MG (50000 UT) CAPS Take 1 capsule by mouth once a week    cetirizine (ZYRTEC) 10 MG tablet Take 1 tablet by mouth daily as needed for Allergies    NONFORMULARY Potassium    Multiple Vitamins-Minerals (THERAPEUTIC MULTIVITAMIN-MINERALS) tablet Take 1 tablet by mouth daily    fluticasone (FLONASE) 50 MCG/ACT nasal spray Use 2 spray(s) in each nostril once daily    hydroCHLOROthiazide (HYDRODIURIL) 25 MG tablet Take 1 tablet by mouth daily    levothyroxine (SYNTHROID) 125 MCG tablet Take 1 tablet by mouth every morning (before breakfast)    vitamin D3  (CHOLECALCIFEROL) 25 MCG (1000 UT) TABS tablet Take 1 tablet by mouth daily    hydrocortisone (PROCTOSOL HC) 2.5 % CREA rectal cream Apply BID as needed externally for up to 1 week for hemorrhoids    methocarbamol (ROBAXIN) 750 MG tablet Take 1 tablet by mouth 4 times daily (Patient not taking: Reported on 11/02/2022)     No current facility-administered medications for this visit.     Medications Discontinued During This Encounter   Medication Reason    ROBAXIN-750 750 MG tablet Not A Current Medication    methylPREDNISolone (MEDROL DOSEPACK) 4 MG tablet Not A Current Medication     ~~~~~~~~~~~~~~~~~~~~~~~~~~~~~~~~~~~~~~~~~~~~~~~~~~~~~~~~~~~    Chief Complaint   Patient presents with    3 weeks follow up     Patient stated she has no strength in her legs and she is unable to drive        History provided by patient  History of Present Illness   ESTHEFANI BASTIEN is a 58 y.o. female who presents to clinic today for:  3 weeks follow up (Patient stated she has no strength in her legs and she is unable to drive )    Tired  Pain in back,  balance is bad  Has to use a walker.  Brother drove her here  She cannot drive.  Legs are weak.  Balance is bad.  Arms also weak and pain in posterior neck.  Has appt for injection this Friday.    Did MRI on low back and showed a bulging disc. (Done at [facility])    On mestinon from [facility], Dr. Karolee Stamps.  Has had labs done for possible myasthenia.  Now on Mestinon.    Health Maintenance  Will do at future visit  Health Maintenance Due   Topic Date Due    HIV screen  Never done    Hepatitis B vaccine (1 of 3 - 19+ 3-dose series) Never done    DTaP/Tdap/Td vaccine (1 - Tdap) Never done    Shingles vaccine (2 of 3) 01/02/2022    Flu vaccine (1) 08/11/2022    COVID-19 Vaccine (3 - 2023-24 season) 09/11/2022     Review of Systems   Review of Systems   Eyes:  Positive for visual disturbance.   Respiratory:  Negative for shortness of breath.    Cardiovascular:  Negative for chest pain.    Musculoskeletal:  Positive for back pain and neck pain.   Psychiatric/Behavioral:  Positive for dysphoric mood. The patient is nervous/anxious.           Vitals/Objective:     Vitals:    11/02/22 1007   BP: 136/82   Site: Left Upper Arm   Position: Sitting   Cuff Size: Medium Adult   Pulse: 86   Resp: 17   Temp: 97.8 F (36.6 C)   TempSrc: Tympanic   SpO2: 98%   Weight: 75.9 kg (167 lb 6.4 oz)   Height: 1.702 m (5\' 7" )     Body mass index is 26.22 kg/m.    Wt Readings from Last 3 Encounters:   11/02/22 75.9 kg (167 lb 6.4 oz)   10/17/22 73.9 kg (163 lb)   10/06/22 74.4 kg (164 lb)         Objective  Physical Exam   General: Patient alert and oriented and in NAD  NECK examination:   Inspection:  normal skin, soft tissue and bony appearance   Normal cervical lordosis; no gross edema or evidence of acute injury;  Midline dorsal scar  Palpation: pain free to palpation  Deep Tendon Reflexes: 2/4 bilat bicep;  2/4 bilat tricep; Brisk with + Isabel Ardila sign on left  Muscular Strength:  5/5 graded muscle strength of the intrinsic muscles of the cervical spine, the deltoid, bicep, tricep, wrist and finger flexors and extensors including the hand intrinsics;   Range of Motion: full active ROM with flexion and extension, left rotation, and right rotation; left and right lateral flexion  Skin: No rashes or lesions noted on exposed skin  Neuro: AAOx3, normal gait and speech.  No gross neurologic deficits.  Psych: Appropriate mood and affect, no homicidal or suicidal ideation, no obsessions, delusions or hallucinations, normal psychomotor status.     No results found for this or any previous visit (from the past 24 hour(s)).   Disposition   No follow-ups on file.   History   Patient's past medical, surgical and family histories were reviewed and updated.    Past Medical History:   Diagnosis Date    Anemia 07/20/2022    H/O seasonal allergies     Hyperlipidemia     Hypertension     Hypothyroid     Ill-defined condition 2005  ependyoma resection  2005, s/p radiation at [facility]    Impaired glucose tolerance      Past Surgical History:   Procedure Laterality Date    EGD DELIVER THERMAL ENERGY SPHNCTR/CARDIA GERD      OTHER SURGICAL HISTORY      ependyoma resection  2005, s/p radiation at [facility]    TONSILLECTOMY AND ADENOIDECTOMY      UPPER GASTROINTESTINAL ENDOSCOPY N/A 06/01/2022    ESOPHAGOGASTRODUODENOSCOPY with biopsy performed by Marisa Hua, MD at [facility] ENDOSCOPY    UPPER GASTROINTESTINAL ENDOSCOPY  06/01/2022      No relevant family history has been documented for this patient.   Social History       Tobacco History       Smoking Status  Never      Smokeless Tobacco Use  Never              Alcohol History       Alcohol Use Status  No              Drug Use       Drug Use Status  No              Sexual Activity       Sexually Active  Not Asked                     Allergies     Allergies   Allergen Reactions    Bee Pollen Other (See Comments)

## 2022-11-02 NOTE — Progress Notes (Signed)
Identified patient with two patient identifiers (name and DOB). Reviewed chart in preparation for visit and have obtained necessary documentation.    Jacqueline Mckinney is a 57 y.o. female  Chief Complaint   Patient presents with    3 weeks follow up     Patient stated she has no strength in her legs and she is unable to drive      Resp 17   Ht 1.610 m (5\' 7" )   Wt 75.9 kg (167 lb 6.4 oz)   BMI 26.22 kg/m     1. Have you been to the ER, urgent care clinic since your last visit?  Hospitalized since your last visit?no    2. Have you seen or consulted any other health care providers outside of the [facility] since your last visit?  Include any pap smears or colon screening. no

## 2022-11-08 ENCOUNTER — Ambulatory Visit
Admit: 2022-11-08 | Payer: BLUE CROSS/BLUE SHIELD | Attending: Family | Primary: Student in an Organized Health Care Education/Training Program

## 2022-11-08 DIAGNOSIS — M5441 Lumbago with sciatica, right side: Secondary | ICD-10-CM

## 2022-11-08 MED ORDER — ROLLATOR ULTRA-LIGHT MISC
0 refills | Status: DC
Start: 2022-11-08 — End: 2023-10-03

## 2022-11-08 NOTE — Progress Notes (Unsigned)
History of present illness:Jacqueline Mckinney is a 57 y.o. female presenting for back pain    Ms. Outten had low back injections 11/04/22. Her pain worsened after the injection and now she is not able to walk because of back pain and not feeling she is able to balance. She was brought in today by niece's husband.     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 Authorizing Provider   pyRIDostigmine (MESTINON) 60 MG tablet Take 1 tablet by mouth 3 times daily   Yes [provider]   gabapentin (NEURONTIN) 100 MG capsule Take 1 capsule by mouth 3 times daily for 30 days. Intended supply: 90 days Max Daily Amount: 300 mg 10/17/22 11/16/22 Yes Pixie Casino, APRN - CNP   Cholecalciferol (VITAMIN D3) 1.25 MG (50000 UT) CAPS Take 1 capsule by mouth once a week 09/02/22 11/25/22 Yes Hearst, Eliberto Ivory, MD   cetirizine (ZYRTEC) 10 MG tablet Take 1 tablet by mouth daily as needed for Allergies 09/02/22  Yes Hearst, Eliberto Ivory, MD   NONFORMULARY Potassium   Yes [provider]   Multiple Vitamins-Minerals (THERAPEUTIC MULTIVITAMIN-MINERALS) tablet Take 1 tablet by mouth daily   Yes  [provider]   fluticasone (FLONASE) 50 MCG/ACT nasal spray Use 2 spray(s) in each nostril once daily 08/05/22  Yes Hearst, Eliberto Ivory, MD   hydroCHLOROthiazide (HYDRODIURIL) 25 MG tablet Take 1 tablet by mouth daily 08/05/22  Yes Hearst, Eliberto Ivory, MD   levothyroxine (SYNTHROID) 125 MCG tablet Take 1 tablet by mouth every morning (before breakfast) 08/05/22  Yes Hearst, Eliberto Ivory, MD   vitamin D3 (CHOLECALCIFEROL) 25 MCG (1000 UT) TABS tablet Take 1 tablet by mouth daily 08/05/22  Yes Hearst, Eliberto Ivory, MD   hydrocortisone (PROCTOSOL HC) 2.5 % CREA rectal cream Apply BID as needed externally for up to 1 week for hemorrhoids 08/05/22  Yes Hearst, Eliberto Ivory, MD   methocarbamol (ROBAXIN) 750 MG tablet Take 1 tablet by mouth 4 times daily  Patient not taking: Reported on 11/02/2022    [provider]        Allergies   Allergen Reactions    Bee Pollen Other (See Comments)       Vitals:    11/08/22 1114   BP: (!) 151/105   Site: Left Upper Arm   Position: Sitting   Cuff Size: Small Adult   Pulse: 95  Resp: 16   Temp: 97 F (36.1 C)   TempSrc: Temporal   SpO2: 100%   Weight: 74.4 kg (164 lb)   Height: 1.702 m (5\' 7" )     Body mass index is 25.69 kg/m.      Objective  Physical Exam    Assessment and Plan    {There are no diagnoses linked to this encounter. (Refresh or delete this SmartLink)}     No follow-ups on file.     Diagnosis and plan discussed with patient who verbillized understanding.

## 2022-11-08 NOTE — Progress Notes (Unsigned)
"  Have you been to the ER, urgent care clinic since your last visit?  Hospitalized since your last visit?"    no    "Have you seen or consulted any other health care providers outside our system since your last visit?"    Yes, [facility] for injection in back        {Click Here for Release of Records Request :[id number]}

## 2022-11-09 NOTE — Telephone Encounter (Signed)
[facility] called in..    Patient was evaluated today. States she would like to continue evals 2 times per week for the next 8 weeks. Also would like to add She does NOT recommend a Rolator.    CB [phone]

## 2022-11-09 NOTE — Assessment & Plan Note (Signed)
Maintain appt for c-spine MRI and follow up with Ortho.

## 2022-11-09 NOTE — Assessment & Plan Note (Addendum)
Patient referred to Home Health Physical Therapy to improve strength and balance.  Order provided for Rollator.  Reassured patient that her disability forms were completed at her previous appointment with Dr. Mikey Bussing. If anything else is needed, advised patient to give them to her Ortho provider to complete.  Maintain follow up appointments with Ortho

## 2022-11-15 NOTE — Telephone Encounter (Signed)
Pt has fallen this morning complain about pain and weakness. She was admitted to [facility] will[facility]. Reported by PT Nurse Olegario Messier [phone] from [facility] health

## 2022-11-18 NOTE — Telephone Encounter (Signed)
FYI

## 2022-11-21 NOTE — Telephone Encounter (Signed)
Left message on Nurse Kathy's voice mail to call and schedule a hospital follow up for her fall.  I checked CJW notes and she is still at [facility].

## 2022-12-15 NOTE — Telephone Encounter (Signed)
 Pt stated that she has been in hospital for a  month has yet to be told when she will be released, Just an [place]

## 2023-02-14 ENCOUNTER — Ambulatory Visit
Admit: 2023-02-14 | Payer: BLUE CROSS/BLUE SHIELD | Attending: Student in an Organized Health Care Education/Training Program | Primary: Student in an Organized Health Care Education/Training Program

## 2023-02-14 VITALS — BP 137/79 | HR 89 | Temp 98.20000°F | Resp 16 | Ht 67.0 in | Wt 166.0 lb

## 2023-02-14 DIAGNOSIS — Z09 Encounter for follow-up examination after completed treatment for conditions other than malignant neoplasm: Secondary | ICD-10-CM

## 2023-02-14 MED ORDER — CETIRIZINE HCL 10 MG PO TABS
10 | ORAL_TABLET | Freq: Every day | ORAL | 1 refills | 30.00000 days | Status: DC | PRN
Start: 2023-02-14 — End: 2023-12-01

## 2023-02-14 MED ORDER — FLUTICASONE PROPIONATE 50 MCG/ACT NA SUSP
50 | Freq: Every day | NASAL | 4 refills | Status: DC | PRN
Start: 2023-02-14 — End: 2023-12-25

## 2023-02-14 NOTE — Progress Notes (Signed)
"  Have you been to the ER, urgent care clinic since your last visit?  Hospitalized since your last visit?"    Yes, [facility]    "Have you seen or consulted any other health care providers outside our system since your last visit?"    NO

## 2023-02-14 NOTE — Progress Notes (Signed)
Assessment/Plan:     Diagnoses and all orders for this visit:    Hospital discharge follow-up  -Patient recently hospitalized due to myasthenia gravis  -Medications reviewed and reconciled  -She is continuing close follow-up with neurology for infusions  -Recommend close follow-up for repeat lab work and monitoring     Myasthenia gravis (HCC)  -Patient recently diagnosed and hospitalized with myasthenia gravis  -Is receiving infusions with neurology  -Also has been started on several different medications  -Keep close follow-up with her specialists  -Patient states her weakness is improving    Essential (primary) hypertension  -Chronic.  Stable.  -Advised patient to monitor her blood pressure readings  -For now continue current dosage of hydrochlorothiazide and losartan  -If BP noted to be elevated persistently at home consider increasing losartan    Depression, unspecified depression type  -Patient with a history of depression during hospitalization  -Was started on fluoxetine and trazodone  -Has noted good improvement.  Denies any SI/HI  -Continue current regimen    Environmental and seasonal allergies  -     cetirizine (ZYRTEC) 10 MG tablet; Take 1 tablet by mouth daily as needed for Allergies  -     fluticasone (FLONASE) 50 MCG/ACT nasal spray; 2 sprays by Nasal route daily as needed for Rhinitis or Allergies Use 2 spray(s) in each nostril once daily  -Chronic.  Patient has noted a flare in her allergy symptoms since being out of medication  -Restart cetirizine and Flonase as needed       Please note that this dictation was completed with Dragon, the computer voice recognition software. Quite often unanticipated grammatical, syntax, homophones, and other interpretive errors are inadvertently transcribed by the computer software. Please disregard these errors. Please excuse any errors that have escaped final proofreading.      Return in about 2 weeks (around 02/28/2023).     Discussed expected  course/resolution/complications of diagnosis in detail with patient.    Medication risks/benefits/costs/interactions/alternatives discussed with patient.    Pt expressed understanding with the diagnosis and plan      Subjective:      Jacqueline Mckinney is a 58 y.o. female who presents for had concerns including Follow-Up from Hospital ([facility]- ).     Current Outpatient Medications   Medication Sig Dispense Refill    mycophenolate (CELLCEPT) 500 MG tablet Take 2 tablets by mouth 2 times daily      pantoprazole (PROTONIX) 40 MG tablet Take 1 tablet by mouth daily      losartan (COZAAR) 25 MG tablet Take 1 tablet by mouth daily      pyRIDostigmine (MESTINON) 30 MG tablet Take 3 tablets by mouth 4 times daily      albuterol sulfate HFA (VENTOLIN HFA) 108 (90 Base) MCG/ACT inhaler Inhale 2 puffs into the lungs every 6 hours as needed for Wheezing or Shortness of Breath      FLUoxetine (PROZAC) 20 MG capsule Take 1 capsule by mouth daily      predniSONE (DELTASONE) 20 MG tablet Take 3 tablets by mouth daily      traZODone (DESYREL) 50 MG tablet Take 1 tablet by mouth nightly      sulfamethoxazole-trimethoprim (BACTRIM DS;SEPTRA DS) 800-160 MG per tablet Take 1 tablet by mouth three times a week      penicillin v potassium (VEETID) 500 MG tablet Take 1 tablet by mouth in the morning and at bedtime      cetirizine (ZYRTEC) 10 MG tablet Take 1 tablet  by mouth daily as needed for Allergies 90 tablet 1    fluticasone (FLONASE) 50 MCG/ACT nasal spray 2 sprays by Nasal route daily as needed for Rhinitis or Allergies Use 2 spray(s) in each nostril once daily 1 each 4    Misc. Devices (ROLLATOR ULTRA-LIGHT) MISC Use for balance 1 each 0    NONFORMULARY Potassium      Multiple Vitamins-Minerals (THERAPEUTIC MULTIVITAMIN-MINERALS) tablet Take 1 tablet by mouth daily      hydroCHLOROthiazide (HYDRODIURIL) 25 MG tablet Take 1 tablet by mouth daily 90 tablet 1    levothyroxine (SYNTHROID) 125 MCG tablet Take 1 tablet by mouth every morning  (before breakfast) 90 tablet 1    vitamin D3 (CHOLECALCIFEROL) 25 MCG (1000 UT) TABS tablet Take 1 tablet by mouth daily 90 tablet 1    hydrocortisone (PROCTOSOL HC) 2.5 % CREA rectal cream Apply BID as needed externally for up to 1 week for hemorrhoids 28 g 0    gabapentin (NEURONTIN) 100 MG capsule Take 1 capsule by mouth 3 times daily for 30 days. Intended supply: 90 days Max Daily Amount: 300 mg 90 capsule 0     No current facility-administered medications for this visit.       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[facility]    TONSILLECTOMY AND ADENOIDECTOMY      UPPER GASTROINTESTINAL ENDOSCOPY N/A 06/01/2022    ESOPHAGOGASTRODUODENOSCOPY with biopsy performed by Marisa Hua, MD at [facility] ENDOSCOPY    UPPER GASTROINTESTINAL ENDOSCOPY  06/01/2022      Family History   Problem Relation Age of Onset    Diabetes Mother       Social History     Socioeconomic History    Marital status: Single     Spouse name: Not on file    Number of children: Not on file    Years of education: Not on file    Highest education level: Not on file   Occupational History    Not on file   Tobacco Use    Smoking status: Never    Smokeless tobacco: Never   Vaping Use    Vaping status: Never Used   Substance and Sexual Activity    Alcohol use: No    Drug use: No    Sexual activity: Not on file   Other Topics Concern    Not on file   Social History Narrative    Not on file     Social Determinants of Health     Financial Resource Strain: Low Risk  (01/21/2022)    Overall Financial Resource  Strain (CARDIA)     Difficulty of Paying Living Expenses: Not hard at all   Food Insecurity: No Food Insecurity (12/26/2022)    Received from [facility]    Hunger Vital Sign     Worried About Running Out of Food  in the Last Year: Never true     Ran Out of Food in the Last Year: Never true   Transportation Needs: No Transportation Needs (12/26/2022)    Received from [facility] - Transportation     Lack of Transportation (Medical): No     Lack of Transportation (Non-Medical): No   Physical Activity: Patient Declined (04/21/2021)    Exercise Vital Sign     Days of Exercise per Week: Patient declined     Minutes of Exercise per Session: Patient declined   Stress: Not on file   Social Connections: Not on file   Intimate Partner Violence: Not on file   Housing Stability: Unknown (01/21/2022)    Housing Stability Vital Sign     Unable to Pay for Housing in the Last Year: Not on file     Number of Places Lived in the Last Year: Not on file     Unstable Housing in the Last Year: No        Patient is a 58 year old female who presents to the office today for hospital discharge follow-up.  Patient states that she was recently hospitalized from December and discharged in January.  She was diagnosed with myasthenia gravis that caused diffuse weakness and did require intubation.  She had care transferred over to [facility] and has been receiving infusions routinely as well as on a new medication regimen.  She states her weakness is still present but has been improving.  She is receiving home PT and OT.  She also has a history of hypertension and remains on hydrochlorothiazide but during hospitalization losartan was added.  Her home blood pressure readings can be slightly variable but most recently have been about 120s to 140s with normal diastolic.  She is agreeable to monitor home blood pressure readings and have close follow-up.  She also was diagnosed with depression during her hospital stay and started on fluoxetine as well  as trazodone.  She feels that her mood is currently stable and much better.  She denies any SI/HI and would like to continue on her current regimen.  She has a history of allergies that is flaring.  She has been out of her Flonase and Zyrtec which has previously helped her in the past.  She denies any other acute concerns.      ROS:   Review of Systems   Constitutional:  Negative for chills and fever.   HENT:  Negative for rhinorrhea.    Respiratory:  Negative for cough and shortness of breath.    Cardiovascular:  Negative for chest pain and leg swelling.   Gastrointestinal:  Negative for abdominal pain, nausea and vomiting.   Neurological:  Positive for weakness (Generalized, improving). Negative for dizziness, numbness and headaches.         Objective:     Vitals:    02/14/23 1000   BP: 137/79   Pulse: 89   Resp: 16   Temp: 98.2 F (36.8 C)   SpO2: 99%          Vitals and Nurse Documentation reviewed.     Physical Exam  Constitutional:       General: She is not in acute distress.     Appearance: Normal appearance. She is not ill-appearing.   HENT:      Head: Normocephalic and atraumatic.   Eyes:      Conjunctiva/sclera: Conjunctivae normal.   Cardiovascular:      Rate and Rhythm: Normal rate and  regular rhythm.      Heart sounds: Normal heart sounds. No murmur heard.     No friction rub. No gallop.   Pulmonary:      Effort: Pulmonary effort is normal. No respiratory distress.      Breath sounds: Normal breath sounds. No wheezing, rhonchi or rales.   Abdominal:      General: Bowel sounds are normal. There is no distension.      Palpations: Abdomen is soft.      Tenderness: There is no abdominal tenderness. There is no guarding or rebound.   Musculoskeletal:      Cervical back: Neck supple.      Right lower leg: No edema.      Left lower leg: No edema.   Neurological:      Mental Status: She is alert and oriented to person, place, and time.      Comments: Using a walker to assist with ambulation due to generalized  weakness         No results found for this visit on 02/14/23.

## 2023-02-23 NOTE — Telephone Encounter (Signed)
Patient requesting a refill on the hydroCHLOROthiazide 25 MG tablet. Requested refill during last office visit Please       [facility] [address] [place], [place] - [address] - P [phone] - F [phone]

## 2023-02-24 ENCOUNTER — Encounter

## 2023-02-24 MED ORDER — HYDROCHLOROTHIAZIDE 25 MG PO TABS
25 MG | ORAL_TABLET | Freq: Every day | ORAL | 0 refills | Status: DC
Start: 2023-02-24 — End: 2023-05-15

## 2023-02-24 NOTE — Telephone Encounter (Signed)
 Patient requesting a refill on the hydroCHLOROthiazide 25 MG tablet. Requested refill during last office visit Please       [facility] [address] [place], [place] - [address] - P [phone] - F [phone]

## 2023-05-15 ENCOUNTER — Encounter

## 2023-05-15 MED ORDER — HYDROCHLOROTHIAZIDE 25 MG PO TABS
25 | ORAL_TABLET | Freq: Every day | ORAL | 0 refills | 90.00000 days | Status: DC
Start: 2023-05-15 — End: 2023-10-03

## 2023-05-15 MED ORDER — LEVOTHYROXINE SODIUM 125 MCG PO TABS
125 | ORAL_TABLET | Freq: Every day | ORAL | 0 refills | 90.00000 days | Status: DC
Start: 2023-05-15 — End: 2023-10-03

## 2023-06-19 NOTE — Telephone Encounter (Signed)
 Patient called and wanting to speak with a nurse.  She is stating that she is in Pain - Pain in the arms, legs, back and her stomach. She has been in pain for 3 weeks.  Denied any fever, vomiting or SOB.   She did recently see the Neurologist at [facility] and was prescribe Pregabalim 75mg  2x a day and a multivitiams 1x aday.  She has not call the neurologist.  Please advises.      CB # [id number]

## 2023-06-19 NOTE — Telephone Encounter (Signed)
 I called & spoke to Ms. Jacqueline Mckinney.  I told her to call her Neurologist, Dr. Jiles Mote, since they are treating her for the pain.  She stated she sees them every 2 to 3 weeks.  Also, told her to go to Urgent Care or ER if get too unbearable.    Please advise.

## 2023-06-20 NOTE — Telephone Encounter (Signed)
 Please call & schedule an appointment.   I left a message for Ms. Galanti to call us  to schedule if she still needed to be seen.  Also, told her to go to ER if pain gets unbearable.

## 2023-06-21 NOTE — Telephone Encounter (Signed)
 Pt did not schedule appt but was informed of PCP advise

## 2023-06-21 NOTE — Telephone Encounter (Signed)
 yes

## 2023-10-03 ENCOUNTER — Ambulatory Visit
Admit: 2023-10-03 | Discharge: 2023-10-03 | Payer: BLUE CROSS/BLUE SHIELD | Attending: Student in an Organized Health Care Education/Training Program | Primary: Student in an Organized Health Care Education/Training Program

## 2023-10-03 VITALS — BP 124/82 | HR 81 | Temp 97.30000°F | Resp 17 | Ht 67.0 in | Wt 205.0 lb

## 2023-10-03 DIAGNOSIS — I10 Essential (primary) hypertension: Principal | ICD-10-CM

## 2023-10-03 MED ORDER — LEVOTHYROXINE SODIUM 125 MCG PO TABS
125 | ORAL_TABLET | Freq: Every day | ORAL | 0 refills | 90.00000 days | Status: DC
Start: 2023-10-03 — End: 2023-12-25

## 2023-10-03 MED ORDER — HYDROCHLOROTHIAZIDE 25 MG PO TABS
25 | ORAL_TABLET | Freq: Every day | ORAL | 1 refills | Status: AC
Start: 2023-10-03 — End: ?

## 2023-10-03 NOTE — Progress Notes (Signed)
 Have you been to the ER, urgent care clinic since your last visit?  Hospitalized since your last visit?   NO    Have you seen or consulted any other health care providers outside our system since your last visit?   NO    Have you had a mammogram?"   NO    Date of last Mammogram: 09/17/2021      "Have you had a pap smear?"    NO    Date of last Cervical Cancer screen (HPV or PAP): 03/28/2018

## 2023-10-03 NOTE — Progress Notes (Signed)
 Assessment/Plan:     Diagnoses and all orders for this visit:    Essential (primary) hypertension  -     CBC with Auto Differential; Future  -     Comprehensive Metabolic Panel; Future  -     Albumin/Creatinine Ratio, Urine; Future  -     hydroCHLOROthiazide  (HYDRODIURIL ) 25 MG tablet; Take 1 tablet by mouth daily  -Chronic.  Stable and well-controlled  -Check routine lab work  -Continue hydrochlorothiazide  daily    Acquired hypothyroidism  -     TSH; Future  -     levothyroxine  (SYNTHROID ) 125 MCG tablet; Take 1 tablet by mouth every morning (before breakfast)  -History of hypothyroidism  -Check TSH level  -Continue current dosage of levothyroxine     Myasthenia gravis (HCC)  -History of myasthenia gravis  -Is currently following with several specialists including neurology  -Continue close follow-up with specialists    Mixed hyperlipidemia  -     Lipid Panel; Future  -History of hyperlipidemia  -Check lipid panel    Vitamin D  deficiency  -     Vitamin D  25 Hydroxy; Future  -History of vitamin D  deficiency  -Not currently on oral vitamin D  but rather a multivitamin  -Repeat vitamin D  level    Prediabetes  -     Hemoglobin A1C; Future  -Previous history of prediabetes  -Repeat hemoglobin A1c    Breast cancer screening by mammogram  -     MAM DIGITAL SCREEN W OR WO CAD BILATERAL; Future     Please note that this dictation was completed with Dragon, the computer voice recognition software. Quite often unanticipated grammatical, syntax, homophones, and other interpretive errors are inadvertently transcribed by the computer software. Please disregard these errors. Please excuse any errors that have escaped final proofreading.      Return for schedule pap smear.     Discussed expected course/resolution/complications of diagnosis in detail with patient.    Medication risks/benefits/costs/interactions/alternatives discussed with patient.    Pt expressed understanding with the diagnosis and plan      Subjective:      Jacqueline Mckinney is a 58 y.o. female who presents for had concerns including Referral - General (Patient here for a mammogram referral.).     Current Outpatient Medications   Medication Sig Dispense Refill    NONFORMULARY Place 2 oz rectally Hemorrhoidal Ointment      pregabalin (LYRICA) 75 MG capsule Take 1 capsule by mouth 2 times daily.      hydroCHLOROthiazide  (HYDRODIURIL ) 25 MG tablet Take 1 tablet by mouth daily 90 tablet 1    levothyroxine  (SYNTHROID ) 125 MCG tablet Take 1 tablet by mouth every morning (before breakfast) 90 tablet 0    pyRIDostigmine (MESTINON) 30 MG tablet Take 2 tablets by mouth 3 times daily      albuterol sulfate HFA (VENTOLIN HFA) 108 (90 Base) MCG/ACT inhaler Inhale 2 puffs into the lungs every 6 hours as needed for Wheezing or Shortness of Breath      traZODone (DESYREL) 50 MG tablet Take 1 tablet by mouth nightly      cetirizine  (ZYRTEC ) 10 MG tablet Take 1 tablet by mouth daily as needed for Allergies 90 tablet 1    fluticasone  (FLONASE ) 50 MCG/ACT nasal spray 2 sprays by Nasal route daily as needed for Rhinitis or Allergies Use 2 spray(s) in each nostril once daily 1 each 4    Multiple Vitamins-Minerals (THERAPEUTIC MULTIVITAMIN-MINERALS) tablet Take 1 tablet by mouth daily  No current facility-administered medications for this visit.       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[facility]    TONSILLECTOMY AND ADENOIDECTOMY      UPPER GASTROINTESTINAL ENDOSCOPY  N/A 06/01/2022    ESOPHAGOGASTRODUODENOSCOPY with biopsy performed by Belynda Mayo, MD at [facility]COPY    UPPER GASTROINTESTINAL ENDOSCOPY  06/01/2022      Family History   Problem Relation Age of Onset    Diabetes Mother       Social History     Socioeconomic History    Marital status: Single     Spouse name: Not on file    Number of children: Not on file    Years of education: Not on file    Highest education level: Not on file   Occupational History    Not on file   Tobacco Use    Smoking status: Never    Smokeless tobacco: Never   Vaping Use    Vaping status: Never Used   Substance and Sexual Activity    Alcohol use: No    Drug use: No    Sexual activity: Not on file   Other Topics Concern    Not on file   Social History Narrative    Not on file     Social Drivers of Health     Financial Resource Strain: Low Risk  (01/21/2022)    Overall Financial Resource Strain (CARDIA)     Difficulty of Paying Living Expenses: Not hard at all   Food Insecurity: No Food Insecurity (10/03/2023)    Hunger Vital Sign     Worried About Running Out of Food in the Last Year: Never true     Ran Out of Food in the Last Year: Never true   Transportation Needs: No Transportation Needs (10/03/2023)    PRAPARE - Therapist, art (Medical): No     Lack of Transportation (Non-Medical): No   Physical Activity: Unknown (04/21/2021)    Received from [facility]  (prior to 06/26/2021)    Exercise Vital Sign     On average, how many days per week do you engage in moderate to strenuous exercise (like a brisk walk)?: Patient declined     On average, how many minutes do you engage in exercise at this level?: Patient declined   Stress: Not on file   Social Connections: Not on file   Intimate Partner Violence: Not on file   Housing Stability: Low Risk  (10/03/2023)    Housing Stability Vital Sign     Unable to Pay for Housing in the Last Year: No     Number of Times Moved in the Last Year: 0     Homeless in the  Last Year: No        Patient is a 58 year old female who presents to the office today for routine follow-up.  She has a known history of hypertension.  Her blood pressure has been stable on her current regimen.  Also of note she is following with multiple specialists due to her history of myasthenia gravis.  Medications were reviewed and reconciled.  She states she has developed some slight dysphagia on occasion and therefore has been scheduled with gastroenterology to do further swallow studies and evaluation.  She also states she has seen pulmonology for breathing tests.  She is currently undergoing physical therapy to assist with her overall strength. She currently is on a multivitamin instead of vitamin D .  She states this was switched by her specialist.  She continues to use Flonase  as needed for her allergies which works well.  Patient states she is also undergoing infusion every 2 months for her myasthenia gravis.  She is due for Pap smear and advised to schedule an appointment.  She is also due for routine mammogram.  She denies any other acute concerns.          ROS:   Review of Systems   Constitutional:  Negative for chills and fever.   HENT:  Negative for rhinorrhea.    Respiratory:  Negative for cough and shortness of breath.    Cardiovascular:  Negative for chest pain and leg swelling.   Gastrointestinal:  Negative for abdominal pain, nausea and vomiting.   Neurological:  Negative for dizziness and headaches.         Objective:     Vitals:    10/03/23 1421   BP: 124/82   Pulse: 81   Resp: 17   Temp: 97.3 F (36.3 C)   SpO2: 96%          Vitals and Nurse Documentation reviewed.     Physical Exam  Constitutional:       General: She is not in acute distress.     Appearance: Normal appearance. She is obese. She is not ill-appearing.   HENT:      Head: Normocephalic and atraumatic.   Eyes:      Conjunctiva/sclera: Conjunctivae normal.   Cardiovascular:      Rate and Rhythm: Normal rate and regular rhythm.       Heart sounds: Normal heart sounds. No murmur heard.     No friction rub. No gallop.   Pulmonary:      Effort: Pulmonary effort is normal. No respiratory distress.      Breath sounds: Normal breath sounds. No wheezing, rhonchi or rales.   Abdominal:      General: Bowel sounds are normal. There is no distension.      Palpations: Abdomen is soft.      Tenderness: There is no abdominal tenderness. There is no guarding or rebound.   Musculoskeletal:      Cervical back: Neck supple.      Right lower leg: No edema.      Left lower leg: No edema.   Neurological:      Mental Status: She is alert and oriented to person, place, and time.      Comments: Using a cane to assist with ambulation         No results found for this visit on 10/03/23.

## 2023-10-04 LAB — CBC WITH AUTO DIFFERENTIAL
Basophils %: 1 % (ref 0.0–1.0)
Basophils Absolute: 0.05 K/UL (ref 0.00–0.10)
Eosinophils %: 3.9 % (ref 0.0–7.0)
Eosinophils Absolute: 0.19 K/UL (ref 0.00–0.40)
Hematocrit: 44.2 % (ref 35.0–47.0)
Hemoglobin: 13.4 g/dL (ref 11.5–16.0)
Immature Granulocytes %: 0.2 % (ref 0.0–0.5)
Immature Granulocytes Absolute: 0.01 K/UL (ref 0.00–0.04)
Lymphocytes %: 30.2 % (ref 12.0–49.0)
Lymphocytes Absolute: 1.49 K/UL (ref 0.80–3.50)
MCH: 23.6 pg — ABNORMAL LOW (ref 26.0–34.0)
MCHC: 30.3 g/dL (ref 30.0–36.5)
MCV: 77.8 FL — ABNORMAL LOW (ref 80.0–99.0)
MPV: 12.5 FL (ref 8.9–12.9)
Monocytes %: 8.7 % (ref 5.0–13.0)
Monocytes Absolute: 0.43 K/UL (ref 0.00–1.00)
Neutrophils %: 56 % (ref 32.0–75.0)
Neutrophils Absolute: 2.76 K/UL (ref 1.80–8.00)
Nucleated RBCs: 0 /100{WBCs}
Platelets: 237 K/uL (ref 150–400)
RBC: 5.68 M/uL — ABNORMAL HIGH (ref 3.80–5.20)
RDW: 15.1 % — ABNORMAL HIGH (ref 11.5–14.5)
WBC: 4.9 K/uL (ref 3.6–11.0)
nRBC: 0 K/uL (ref 0.00–0.01)

## 2023-10-04 LAB — LIPID PANEL
Chol/HDL Ratio: 4.8 (ref 0.0–5.0)
Cholesterol, Total: 238 mg/dL — ABNORMAL HIGH (ref 0–200)
HDL: 50 mg/dL (ref 40–60)
LDL Cholesterol: 120 mg/dL — ABNORMAL HIGH (ref 0–100)
Triglycerides: 339 mg/dL — ABNORMAL HIGH (ref 0–150)
VLDL Cholesterol Calculated: 68 mg/dL

## 2023-10-04 LAB — COMPREHENSIVE METABOLIC PANEL
ALT: 50 U/L — ABNORMAL HIGH (ref 10–35)
AST: 43 U/L — ABNORMAL HIGH (ref 10–35)
Albumin/Globulin Ratio: 1.4 (ref 1.1–2.2)
Albumin: 4.5 g/dL (ref 3.5–5.2)
Alk Phosphatase: 118 U/L — ABNORMAL HIGH (ref 35–104)
Anion Gap: 11 mmol/L (ref 2–14)
BUN/Creatinine Ratio: 18 (ref 12–20)
BUN: 12 mg/dL (ref 6–20)
CO2: 28 mmol/L (ref 20–29)
Calcium: 10.4 mg/dL — ABNORMAL HIGH (ref 8.6–10.0)
Chloride: 99 mmol/L (ref 98–107)
Creatinine: 0.69 mg/dL (ref 0.60–1.00)
Est, Glom Filt Rate: >90 ml/min/1.73m2 (ref >59)
Globulin: 3.2 g/dL (ref 2.0–4.0)
Glucose: 88 mg/dL (ref 65–100)
Potassium: 4.4 mmol/L (ref 3.5–5.1)
Sodium: 138 mmol/L (ref 136–145)
Total Bilirubin: 1 mg/dL (ref 0.0–1.2)
Total Protein: 7.7 g/dL (ref 6.4–8.3)

## 2023-10-04 LAB — ALBUMIN/CREATININE RATIO, URINE
Albumin Urine: <1.2 mg/dL
Creatinine, Ur: 86.9 mg/dL (ref 28.00–217.00)

## 2023-10-04 LAB — TSH: TSH, 3rd Generation: 5.13 u[IU]/mL — ABNORMAL HIGH (ref 0.270–4.200)

## 2023-10-04 LAB — HEMOGLOBIN A1C
Estimated Avg Glucose: 140 mg/dL
Hemoglobin A1C: 6.5 % — ABNORMAL HIGH (ref 4.0–5.6)

## 2023-10-04 LAB — VITAMIN D 25 HYDROXY: Vit D, 25-Hydroxy: 29.9 ng/mL — ABNORMAL LOW (ref 30.0–100.0)

## 2023-10-04 LAB — URINE CULTURE HOLD SAMPLE

## 2023-11-01 ENCOUNTER — Ambulatory Visit
Admit: 2023-11-01 | Discharge: 2023-11-06 | Payer: Medicaid (Managed Care) | Attending: Student in an Organized Health Care Education/Training Program | Primary: Student in an Organized Health Care Education/Training Program

## 2023-11-01 ENCOUNTER — Inpatient Hospital Stay
Admit: 2023-11-03 | Payer: Medicaid (Managed Care) | Primary: Student in an Organized Health Care Education/Training Program

## 2023-11-01 VITALS — BP 126/88 | HR 68 | Temp 97.80000°F | Resp 17 | Ht 67.0 in | Wt 206.0 lb

## 2023-11-01 DIAGNOSIS — Z124 Encounter for screening for malignant neoplasm of cervix: Principal | ICD-10-CM

## 2023-11-01 MED ORDER — VITAMIN D3 10 MCG (400 UNIT) PO CAPS
10 | ORAL_CAPSULE | Freq: Every day | ORAL | 0 refills | Status: DC
Start: 2023-11-01 — End: 2024-01-31

## 2023-11-01 MED ORDER — GLUCOSE MONITORING KIT
PACK | 0 refills | Status: AC
Start: 2023-11-01 — End: ?

## 2023-11-01 MED ORDER — BLOOD GLUCOSE TEST VI STRP
ORAL_STRIP | 0 refills | Status: AC
Start: 2023-11-01 — End: ?

## 2023-11-01 MED ORDER — LANCETS MISC
3 refills | Status: AC
Start: 2023-11-01 — End: ?

## 2023-11-01 NOTE — Progress Notes (Signed)
 "Assessment/Plan:     Diagnoses and all orders for this visit:    Cervical cancer screening  -     PAP IG, Aptima HPV and rfx 16/18,45 ([id number]); Future  -Patient due for routine Pap smear.  -She denies any history of abnormal Pap smear  -Pap smear completed today    Vaginal odor  -     Nuswab Vaginitis (VG); Future  -Patient endorses new onset vaginal odor  -Denies any significant discharge  -Will check a NuSwab today.    Pain of right breast  -     MAM TOMO DIGITAL DIAGNOSTIC BILATERAL; Future  -     US  BREAST LIMITED RIGHT; Future  -Patient endorses 1 month history of right sided lateral breast pain  -Will order a diagnostic mammogram and right breast ultrasound    Acquired hypothyroidism  -     TSH; Future  -History of hypothyroidism  -Previous TSH slightly elevated  -Repeat TSH level  -Continue current dosage of levothyroxine  125 mcg daily.  If persistently elevated would increase dosage    Elevated hemoglobin A1c  -     Basic Metabolic Panel; Future  -Recent A1c elevated at 6.5 however fasting blood sugar level was within normal limits  -She is asymptomatic.  Therefore I do recommend monitoring fasting blood sugar levels  -If fasting blood sugar level noted in the diabetic range would recommend follow-up for diabetes  -Repeat BMP.  She states she is fasting today    Hypercalcemia  -     Calcium Ionized Serum; Future  -     Basic Metabolic Panel; Future  -Previous history of hypercalcemia  -Check an ionized calcium today    Vitamin D  deficiency  -     Cholecalciferol (VITAMIN D3) 10 MCG (400 UNIT) CAPS Capsule; Take 1 capsule by mouth daily  -History of low vitamin D   -Recommend taking vitamin D3 400 units daily    Elevated liver enzymes  -     Hepatic Function Panel; Future  -History of hepatic Steatosis  -Mild elevation in liver enzymes noted  -Repeat lab work today  -Patient currently states she does not drink and does not use Tylenol routinely     Please note that this dictation was completed with Dragon, the  computer voice recognition software. Quite often unanticipated grammatical, syntax, homophones, and other interpretive errors are inadvertently transcribed by the computer software. Please disregard these errors. Please excuse any errors that have escaped final proofreading.      Return in about 1 month (around 12/02/2023) for Elevated A1C.     Discussed expected course/resolution/complications of diagnosis in detail with patient.    Medication risks/benefits/costs/interactions/alternatives discussed with patient.    Pt expressed understanding with the diagnosis and plan.       Subjective:      Jacqueline Mckinney is a 58 y.o. female who presents for had concerns including Gynecologic Exam (Patient here for a PAP smear exam.).     Current Outpatient Medications   Medication Sig Dispense Refill    Cholecalciferol (VITAMIN D3) 10 MCG (400 UNIT) CAPS Capsule Take 1 capsule by mouth daily 90 capsule 0    NONFORMULARY Place 2 oz rectally Hemorrhoidal Ointment      pregabalin (LYRICA) 75 MG capsule Take 1 capsule by mouth 2 times daily.      hydroCHLOROthiazide  (HYDRODIURIL ) 25 MG tablet Take 1 tablet by mouth daily 90 tablet 1    levothyroxine  (SYNTHROID ) 125 MCG tablet Take 1 tablet by mouth  every morning (before breakfast) 90 tablet 0    pyRIDostigmine (MESTINON) 30 MG tablet Take 2 tablets by mouth 3 times daily      albuterol sulfate HFA (VENTOLIN HFA) 108 (90 Base) MCG/ACT inhaler Inhale 2 puffs into the lungs every 6 hours as needed for Wheezing or Shortness of Breath      traZODone (DESYREL) 50 MG tablet Take 1 tablet by mouth nightly      cetirizine  (ZYRTEC ) 10 MG tablet Take 1 tablet by mouth daily as needed for Allergies 90 tablet 1    fluticasone  (FLONASE ) 50 MCG/ACT nasal spray 2 sprays by Nasal route daily as needed for Rhinitis or Allergies Use 2 spray(s) in each nostril once daily 1 each 4    Multiple Vitamins-Minerals (THERAPEUTIC MULTIVITAMIN-MINERALS) tablet Take 1 tablet by mouth daily       No current  facility-administered medications for this visit.       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[facility]    TONSILLECTOMY AND ADENOIDECTOMY      UPPER GASTROINTESTINAL ENDOSCOPY N/A 06/01/2022    ESOPHAGOGASTRODUODENOSCOPY with biopsy performed by Belynda Mayo, MD at [facility] ENDOSCOPY    UPPER GASTROINTESTINAL ENDOSCOPY  06/01/2022      Family History   Problem Relation Age of Onset    Diabetes Mother       Social History     Socioeconomic History    Marital status: Single     Spouse name: Not on file    Number of children: Not on file    Years of education: Not on file    Highest education level: Not on file   Occupational History    Not on file   Tobacco Use    Smoking status: Never    Smokeless tobacco: Never   Vaping Use    Vaping status: Never Used   Substance and Sexual Activity    Alcohol use: No    Drug use: No    Sexual activity: Not on file   Other Topics Concern    Not on file   Social History Narrative    Not on file     Social Drivers of Health     Financial Resource Strain: Low Risk  (01/21/2022)    Overall Financial Resource Strain (CARDIA)     Difficulty of Paying Living Expenses: Not hard at all   Food Insecurity: No Food Insecurity (10/03/2023)    Hunger Vital Sign     Worried About Running Out of Food in the Last Year: Never true     Ran Out of Food in the Last Year: Never true   Transportation Needs: No Transportation Needs (10/03/2023)    PRAPARE - Product/process Development Scientist (Medical): No  Lack of Transportation (Non-Medical): No   Physical Activity: Unknown (04/21/2021)    Received from [facility]  (prior to 06/26/2021)    Exercise Vital Sign     On average, how many days per week do you engage in moderate to strenuous exercise (like a brisk walk)?: Patient declined     On average, how many minutes do you engage in exercise at this level?: Patient declined   Stress: Not on file   Social Connections: Not on file   Intimate Partner Violence: Not on file   Housing Stability: Low Risk  (10/03/2023)    Housing Stability Vital Sign     Unable to Pay for Housing in the Last Year: No     Number of Times Moved in the Last Year: 0     Homeless in the Last Year: No        Patient is a 58 year old female who presents to the office today for routine Pap smear.  Patient denies ever having an abnormal Pap smear. She is due for routine Pap smear today.  Patient does state there is a slight odor noted vaginally but she denies any specific discharge, itching or abnormal bleeding. She states she went through menopause around 58 years old.  She also endorses a 1 month history of right lateral side breast pain.  She denies any other associated symptoms.  It is tender to palpation.  Therefore her screening mammogram does need to be changed to diagnostic.  Patient also did have recent lab work completed. Her TSH was slightly elevated but she states she is taking her levothyroxine  consistently.  Also her A1c was in the diabetic range but her glucose was within normal limits.  She is not having any signs or symptoms of diabetes.  Therefore I do recommend monitoring fasting blood sugar levels.  She also had a low vitamin D  and is not currently taking any oral vitamin D  at this time. Her liver enzymes were slightly elevated.  She denies any history of alcohol use, smoking history and does not use Tylenol frequently.  She denies any other acute concerns.          ROS:   Review of  Systems   Constitutional:  Negative for chills and fever.   HENT:  Negative for rhinorrhea.    Respiratory:  Negative for cough and shortness of breath.    Cardiovascular:  Negative for chest pain and leg swelling.   Gastrointestinal:  Negative for abdominal pain, nausea and vomiting.   Neurological:  Negative for dizziness, weakness, numbness and headaches.         Objective:     Vitals:    11/01/23 1016   BP: 126/88   Pulse: 68   Resp: 17   Temp: 97.8 F (36.6 C)   SpO2: 96%          Vitals and Nurse Documentation reviewed.     Physical Exam  Exam conducted with a chaperone present Malinda Mulch).   Constitutional:       General: She is not in acute distress.     Appearance: Normal appearance. She is obese. She is not ill-appearing.   HENT:      Head: Normocephalic and atraumatic.   Eyes:      Conjunctiva/sclera: Conjunctivae normal.   Cardiovascular:      Rate and Rhythm: Normal rate and regular rhythm.      Heart sounds: Normal heart sounds. No murmur heard.  No friction rub. No gallop.   Pulmonary:      Effort: Pulmonary effort is normal. No respiratory distress.      Breath sounds: Normal breath sounds. No wheezing, rhonchi or rales.   Chest:   Breasts:     Right: Tenderness present. No bleeding, inverted nipple, mass, nipple discharge or skin change.      Left: Normal.      Comments: Mild tenderness to palpation noted on the right lateral breast  Abdominal:      General: Bowel sounds are normal. There is no distension.      Palpations: Abdomen is soft.      Tenderness: There is no abdominal tenderness. There is no guarding or rebound.   Genitourinary:     General: Normal vulva.      Vagina: Normal.      Cervix: Normal.      Uterus: Normal.       Adnexa: Right adnexa normal and left adnexa normal.   Musculoskeletal:      Cervical back: Neck supple.      Right lower leg: No edema.      Left lower leg: No edema.   Neurological:      Mental Status: She is alert and oriented to person, place, and time.       Comments: Uses a cane to assist with ambulation         No results found for this visit on 11/01/23.        "

## 2023-11-01 NOTE — Progress Notes (Signed)
 Have you been to the ER, urgent care clinic since your last visit?  Hospitalized since your last visit?   NO    Have you seen or consulted any other health care providers outside our system since your last visit?   NO    Have you had a mammogram?"   NO    Date of last Mammogram: 09/17/2021      "Have you had a pap smear?"    NO    Date of last Cervical Cancer screen (HPV or PAP): 03/28/2018

## 2023-11-02 LAB — HEPATIC FUNCTION PANEL: Albumin/Globulin Ratio: 1.3 (ref 1.1–2.2)

## 2023-11-02 LAB — BASIC METABOLIC PANEL
CO2: 29 mmol/L (ref 20–29)
Calcium: 10.5 mg/dL — ABNORMAL HIGH (ref 8.6–10.0)
Potassium: 4.5 mmol/L (ref 3.5–5.1)

## 2023-11-05 LAB — NUSWAB VAGINITIS (VG)
Candida albicans, NAA: NEGATIVE
Candida glabrata: NEGATIVE
Trichomonas Vaginalis by NAA: NEGATIVE

## 2023-11-05 LAB — CALCIUM IONIZED SERUM: Calcium, Ionized: 5.4 mg/dL (ref 4.5–5.6)

## 2023-11-06 LAB — HPV, APTIMA HIGH 16/18,45: HPV HR w/rfx 16,18/45: NEGATIVE

## 2023-11-29 ENCOUNTER — Encounter

## 2023-12-01 MED ORDER — CETIRIZINE HCL 10 MG PO TABS
10 | ORAL_TABLET | ORAL | 1 refills | 30.00000 days | Status: AC
Start: 2023-12-01 — End: ?

## 2023-12-06 ENCOUNTER — Ambulatory Visit
Admit: 2023-12-06 | Discharge: 2023-12-06 | Payer: Medicaid (Managed Care) | Attending: Student in an Organized Health Care Education/Training Program | Primary: Student in an Organized Health Care Education/Training Program

## 2023-12-06 VITALS — BP 120/86 | HR 77 | Temp 97.40000°F | Resp 17 | Ht 67.0 in | Wt 202.0 lb

## 2023-12-06 DIAGNOSIS — R7309 Other abnormal glucose: Principal | ICD-10-CM

## 2023-12-06 NOTE — Progress Notes (Signed)
"  Have you been to the ER, urgent care clinic since your last visit?  Hospitalized since your last visit?   NO    Have you seen or consulted any other health care providers outside our system since your last visit?   NO    Have you had a mammogram?   NO    Date of last Mammogram: 09/17/2021           "

## 2023-12-06 NOTE — Progress Notes (Signed)
 "Assessment/Plan:     Diagnoses and all orders for this visit:    Elevated hemoglobin A1c  -A1c previously elevated at 6.5  -However I suspect this may have been due to the steroids she was receiving along with other medications for her myasthenia gravis  -Her fasting blood sugar levels remain primarily in the 90s to low 100s now   -Therefore recommend eating a healthy and well-balanced diet  -Will repeat A1c in about 2 months for reevaluation    Elevated liver enzymes  -Mild elevation in liver enzymes  -Patient states she is on a different infusion for her myasthenia gravis  -Suspect the medication is causing mild elevation in her liver enzymes  -Will monitor at follow-up  -If persistent elevation may consider hepatology referral     Hepatic steatosis  -Chronic.  -Elevation in liver enzymes may be secondary to medication  -Will monitor and if persistent elevation consider evaluation by hepatology  -Recommend eating a healthy and well-balanced diet     Please note that this dictation was completed with Dragon, the computer voice recognition software. Quite often unanticipated grammatical, syntax, homophones, and other interpretive errors are inadvertently transcribed by the computer software. Please disregard these errors. Please excuse any errors that have escaped final proofreading.      Return in about 2 months (around 02/05/2024).     Discussed expected course/resolution/complications of diagnosis in detail with patient.    Medication risks/benefits/costs/interactions/alternatives discussed with patient.    Pt expressed understanding with the diagnosis and plan      Subjective:      Jacqueline Mckinney is a 58 y.o. female who presents for had concerns including Follow-up (Patient here for A1C follow up.).     Current Outpatient Medications   Medication Sig Dispense Refill    cetirizine  (ZYRTEC ) 10 MG tablet TAKE 1 TABLET BY MOUTH ONCE DAILY AS NEEDED FOR ALLERGIES 90 tablet 1    Cholecalciferol (VITAMIN D3) 10 MCG  (400 UNIT) CAPS Capsule Take 1 capsule by mouth daily 90 capsule 0    NONFORMULARY Place 2 oz rectally Hemorrhoidal Ointment      pregabalin (LYRICA) 75 MG capsule Take 1 capsule by mouth 2 times daily.      hydroCHLOROthiazide  (HYDRODIURIL ) 25 MG tablet Take 1 tablet by mouth daily 90 tablet 1    levothyroxine  (SYNTHROID ) 125 MCG tablet Take 1 tablet by mouth every morning (before breakfast) 90 tablet 0    pyRIDostigmine (MESTINON) 30 MG tablet Take 2 tablets by mouth 3 times daily      albuterol sulfate HFA (VENTOLIN HFA) 108 (90 Base) MCG/ACT inhaler Inhale 2 puffs into the lungs every 6 hours as needed for Wheezing or Shortness of Breath      traZODone (DESYREL) 50 MG tablet Take 1 tablet by mouth nightly      fluticasone  (FLONASE ) 50 MCG/ACT nasal spray 2 sprays by Nasal route daily as needed for Rhinitis or Allergies Use 2 spray(s) in each nostril once daily 1 each 4    Multiple Vitamins-Minerals (THERAPEUTIC MULTIVITAMIN-MINERALS) tablet Take 1 tablet by mouth daily      glucose monitoring kit Use to check fasting blood sugar level daily.  Provide brand covered by insurance. (Patient not taking: Reported on 12/06/2023) 1 kit 0    blood glucose monitor strips Use to check fasting blood sugar level daily.  Provide brand covered by insurance. (Patient not taking: Reported on 12/06/2023) 100 strip 0    Lancets MISC Use to check fasting blood  sugar level daily.  Provide brand covered by insurance. (Patient not taking: Reported on 12/06/2023) 100 each 3     No current facility-administered medications for this visit.       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[facility]    TONSILLECTOMY AND ADENOIDECTOMY      UPPER GASTROINTESTINAL ENDOSCOPY N/A 06/01/2022    ESOPHAGOGASTRODUODENOSCOPY with biopsy performed by Belynda Mayo, MD at [facility] ENDOSCOPY    UPPER GASTROINTESTINAL ENDOSCOPY  06/01/2022      Family History   Problem Relation Age of Onset    Diabetes Mother       Social History     Socioeconomic History    Marital status: Single     Spouse name: Not on file    Number of children: Not on file    Years of education: Not on file    Highest education level: Not on file   Occupational History    Not on file   Tobacco Use    Smoking status: Never    Smokeless tobacco: Never   Vaping Use    Vaping status: Never Used   Substance and Sexual Activity    Alcohol use: No    Drug use: No    Sexual activity: Not on file   Other Topics Concern    Not on file   Social History Narrative    Not on file     Social Drivers of Health     Financial Resource Strain: Low Risk  (01/21/2022)    Overall Financial Resource Strain (CARDIA)     Difficulty of Paying Living Expenses: Not hard at all   Food Insecurity: No Food Insecurity (10/03/2023)    Hunger Vital Sign     Worried About Running Out of Food in the Last Year: Never true     Ran Out of Food in the Last Year: Never true   Transportation Needs: No Transportation Needs (10/03/2023)    PRAPARE - Therapist, Art (Medical): No     Lack of Transportation (Non-Medical): No   Physical Activity: Unknown (04/21/2021)    Received from [facility]  (prior to 06/26/2021)    Exercise Vital Sign     On average, how many days per week do you engage in moderate to strenuous exercise (like a brisk walk)?: Patient declined     On average, how many minutes do you engage in exercise at this level?: Patient declined   Stress: Not on file   Social  Connections: Not on file   Intimate Partner Violence: Not on file   Housing Stability: Low Risk  (10/03/2023)    Housing Stability Vital Sign     Unable to Pay for Housing in the Last  Year: No     Number of Times Moved in the Last Year: 0     Homeless in the Last Year: No        Patient is a 58 year old female who presents to the office today for routine follow-up.  She has been monitoring her blood sugar levels and states that her fasting blood sugar was about 100.  Previously it was in the 90s and has always been less than 126.  I suspect her elevated A1c may have been due to her previous steroid injections that she would get with infusions.  She also has mild elevation in her liver enzymes which is a possible side effect to her current infusions for her myasthenia gravis.  She does have an upcoming appointment with her specialist.  She states she has already received the flu vaccine and has an appointment for her mammogram.  She denies any other acute concerns today.          ROS:   Review of Systems   Constitutional:  Negative for chills and fever.   HENT:  Negative for rhinorrhea.    Respiratory:  Negative for cough and shortness of breath.    Cardiovascular:  Negative for chest pain and leg swelling.   Gastrointestinal:  Negative for abdominal pain, nausea and vomiting.   Neurological:  Negative for dizziness, weakness, numbness and headaches.         Objective:     Vitals:    12/06/23 1047   BP: 120/86   Pulse: 77   Resp: 17   Temp: 97.4 F (36.3 C)   SpO2: 97%          Vitals and Nurse Documentation reviewed.     Physical Exam  Constitutional:       General: She is not in acute distress.     Appearance: Normal appearance. She is overweight. She is not ill-appearing.   HENT:      Head: Normocephalic and atraumatic.   Eyes:      Conjunctiva/sclera: Conjunctivae normal.   Cardiovascular:      Rate and Rhythm: Normal rate and regular rhythm.      Heart sounds: Normal heart sounds. No murmur heard.     No  friction rub. No gallop.   Pulmonary:      Effort: Pulmonary effort is normal. No respiratory distress.      Breath sounds: Normal breath sounds. No wheezing, rhonchi or rales.   Abdominal:      General: Bowel sounds are normal. There is no distension.      Palpations: Abdomen is soft.      Tenderness: There is no abdominal tenderness. There is no guarding or rebound.   Musculoskeletal:      Cervical back: Neck supple.      Right lower leg: No edema.      Left lower leg: No edema.   Neurological:      Mental Status: She is alert and oriented to person, place, and time.      Gait: Gait normal.         No results found for this visit on 12/06/23.        "

## 2023-12-15 LAB — HM MAMMOGRAPHY

## 2023-12-24 ENCOUNTER — Encounter

## 2023-12-25 MED ORDER — FLUTICASONE PROPIONATE 50 MCG/ACT NA SUSP
50 | NASAL | 2 refills | Status: AC
Start: 2023-12-25 — End: ?

## 2023-12-25 MED ORDER — LEVOTHYROXINE SODIUM 125 MCG PO TABS
125 | ORAL_TABLET | ORAL | 1 refills | Status: AC
Start: 2023-12-25 — End: ?

## 2024-01-29 ENCOUNTER — Encounter

## 2024-01-31 MED ORDER — VITAMIN D3 10 MCG (400 UNIT) PO CAPS
10 | ORAL_CAPSULE | Freq: Every day | ORAL | 1 refills | Status: AC
Start: 2024-01-31 — End: ?

## 2024-02-02 NOTE — Telephone Encounter (Signed)
 Lvm regarding inclement weather schedule appointment may be rescheduled or schedule for virtual visit.

## 2024-02-05 ENCOUNTER — Encounter
Payer: Medicaid (Managed Care) | Attending: Student in an Organized Health Care Education/Training Program | Primary: Student in an Organized Health Care Education/Training Program

## 2024-02-14 ENCOUNTER — Ambulatory Visit
Admit: 2024-02-14 | Payer: Medicaid (Managed Care) | Attending: Family Medicine | Primary: Student in an Organized Health Care Education/Training Program

## 2024-02-14 DIAGNOSIS — L308 Other specified dermatitis: Principal | ICD-10-CM

## 2024-02-14 NOTE — Progress Notes (Signed)
 Jacqueline Mckinney is a 59 y.o. female    Identified pt with two pt identifiers(name and DOB). Reviewed record in preparation for visit and have obtained necessary documentation. All patient medications has been reviewed.    No chief complaint on file.      Wt Readings from Last 3 Encounters:   12/06/23 91.6 kg (202 lb)   11/01/23 93.4 kg (206 lb)   10/03/23 93 kg (205 lb)     Temp Readings from Last 3 Encounters:   12/06/23 97.4 F (36.3 C) (Temporal)   11/01/23 97.8 F (36.6 C) (Temporal)   10/03/23 97.3 F (36.3 C) (Temporal)     BP Readings from Last 3 Encounters:   12/06/23 120/86   11/01/23 126/88   10/03/23 124/82     Pulse Readings from Last 3 Encounters:   12/06/23 77   11/01/23 68   10/03/23 81       Have you been to the ER, urgent care clinic since your last visit?  Hospitalized since your last visit?   no    Have you seen or consulted any other health care providers outside our system since your last visit?   Yes, Pain Management & Neurologist at [facility]      Patient had an IV infusion on 01/16/24 at patient house for Lumbar radiculopathy. Patient stated she has blood in nose when sneezing or blowing nose 2 weeks go. Patient has been having itchy skin all over body for a few months now.      This patient is accompanied in the office by her self. Verified via verbal consent with HENSLEY AZIZ that they were allowed in the room during the visit today and can hear any/all medical information that may be discussed.

## 2024-02-14 NOTE — Progress Notes (Signed)
 Jacqueline Mckinney  59 y.o. female  06-20-1965  FMW:[id number]  [facility]  Progress Note     Encounter Date: 02/14/2024    Assessment and Plan:       ICD-10-CM    1. Pruritic dermatitis  L30.8 AFL -  Joesphine Planas, MD, Dermatology, [facility])      2. Myasthenia gravis (HCC)  G70.00         1. Pruritic dermatitis  Could be reaction to new infusion med, Soliris but also conceivably is scabies  Will see if we can get her to see DERM.  With her Myasthenia, want to avoid meds that might worsen that condition and unfortunately, that includes meds for scabies and steroids.  -     AFL -  Joesphine Planas, MD, Dermatology, [place] ([address] [place])  2. Myasthenia gravis (HCC)       I have discussed the diagnosis with the patient and the intended plan as seen in the above orders.  she has expressed understanding.  The patient has received an after-visit summary and questions were answered concerning future plans.  I have discussed medication side effects and warnings with the patient as well.    Electronically Signed: Charlie VEAR [place], MD    Current Medications after this visit     Current Outpatient Medications   Medication Sig    eculizumab (SOLIRIS) 300 MG/30ML SOLN Infuse intravenously every 8 weeks    Cholecalciferol (VITAMIN D3) 10 MCG (400 UNIT) CAPS Capsule Take 1 capsule by mouth once daily    fluticasone  (FLONASE ) 50 MCG/ACT nasal spray INHALE 2 SPRAY(S) IN EACH NOSTRIL ONCE DAILY AS NEEDED FOR RHINITIS OR ALLERGIES    levothyroxine  (SYNTHROID ) 125 MCG tablet TAKE 1 TABLET BY MOUTH ONCE DAILY IN THE MORNING BEFORE BREAKFAST    cetirizine  (ZYRTEC ) 10 MG tablet TAKE 1 TABLET BY MOUTH ONCE DAILY AS NEEDED FOR ALLERGIES    glucose monitoring kit Use to check fasting blood sugar level daily.  Provide brand covered by insurance.    blood glucose monitor strips Use to check fasting blood sugar level daily.  Provide brand covered by insurance.    Lancets MISC Use to check  fasting blood sugar level daily.  Provide brand covered by insurance.    pregabalin (LYRICA) 75 MG capsule Take 1 capsule by mouth 2 times daily.    hydroCHLOROthiazide  (HYDRODIURIL ) 25 MG tablet Take 1 tablet by mouth daily    pyRIDostigmine (MESTINON) 30 MG tablet Take 2 tablets by mouth 3 times daily    albuterol sulfate HFA (VENTOLIN HFA) 108 (90 Base) MCG/ACT inhaler Inhale 2 puffs into the lungs every 6 hours as needed for Wheezing or Shortness of Breath    traZODone (DESYREL) 50 MG tablet Take 1 tablet by mouth nightly    Multiple Vitamins-Minerals (THERAPEUTIC MULTIVITAMIN-MINERALS) tablet Take 1 tablet by mouth daily     No current facility-administered medications for this visit.     Medications Discontinued During This Encounter   Medication Reason    NONFORMULARY Not A Current Medication     ~~~~~~~~~~~~~~~~~~~~~~~~~~~~~~~~~~~~~~~~~~~~~~~~~~~~~~~~~~~    Chief Complaint   Patient presents with    Follow-up       History provided by patient  History of Present Illness   Jacqueline Mckinney is a 59 y.o. female who presents to clinic today for:  Follow-up    Having infusion of soliris every 8 weeks for myasthenia gravis.  Lots of itching and scratching.   No dogs  or cats.  On Zyrtec   Cannot sleep well.  None of her meds is new excpe the Soliris.      Health Maintenance    Health Maintenance Due   Topic Date Due    HIV screen  Never done    Hepatitis B vaccine (1 of 3 - 19+ 3-dose series) Never done    DTaP/Tdap/Td vaccine (1 - Tdap) Never done    Pneumococcal 50+ years Vaccine (1 of 1 - PCV) Never done    Shingles vaccine (2 of 3) 01/02/2022    Meningococcal B vaccine (2 of 5 - Increased Risk Bexsero 3-dose series) 01/30/2023    Meningococcal (ACWY) vaccine (2 - Risk 2-dose series) 02/27/2023    Flu vaccine (1) 08/11/2023    COVID-19 Vaccine (3 - 2025-26 season) 09/11/2023    A1C test (Diabetic or Prediabetic)  01/02/2024     Review of Systems   Review of Systems   Constitutional:  Negative for chills and  fever.   Psychiatric/Behavioral: Negative.            Vitals/Objective:     Vitals:    02/14/24 1253   BP: 124/80   BP Site: Left Upper Arm   Patient Position: Sitting   BP Cuff Size: Large Adult   Pulse: 75   Resp: 17   Temp: 97.6 F (36.4 C)   TempSrc: Temporal   SpO2: 98%   Weight: 90.7 kg (200 lb)   Height: 1.702 m (5' 7)     Body mass index is 31.32 kg/m.    Wt Readings from Last 3 Encounters:   02/14/24 90.7 kg (200 lb)   12/06/23 91.6 kg (202 lb)   11/01/23 93.4 kg (206 lb)         Objective  Physical Exam   General: Patient alert and oriented and in NAD  Skin: diffuse, sparse papular rash, extremities and trunk, excoriated.  Neuro: AAOx3, normal gait and speech.  No gross neurologic deficits.  Psych: Appropriate mood and affect, no homicidal or suicidal ideation, no obsessions, delusions or hallucinations, normal psychomotor status.     No results found for this or any previous visit (from the past 24 hours).   Disposition   No follow-ups on file.   History   Patient's past medical, surgical and family histories were reviewed and updated.    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   Social History       Tobacco History       Smoking Status  Never      Smokeless Tobacco Use  Never  Alcohol History       Alcohol Use  Status  No              Drug Use       Drug Use Status  No              Sexual Activity       Sexually Active  Not Asked                     Allergies     Allergies   Allergen Reactions    Bee Pollen Other (See Comments)    Environmental/Seasonal Other (See Comments)    Pollen Extract Itching

## 2024-03-06 ENCOUNTER — Encounter
Payer: Medicaid (Managed Care) | Attending: Student in an Organized Health Care Education/Training Program | Primary: Student in an Organized Health Care Education/Training Program
